# Patient Record
Sex: Female | Born: 1960 | Race: White | Hispanic: No | Marital: Married | Smoking: Current every day smoker
Health system: Southern US, Community
[De-identification: ages and names within clinical notes are randomized; demographics above are authoritative.]

## PROBLEM LIST (undated history)

## (undated) DIAGNOSIS — I071 Rheumatic tricuspid insufficiency: Secondary | ICD-10-CM

## (undated) DIAGNOSIS — F329 Major depressive disorder, single episode, unspecified: Secondary | ICD-10-CM

## (undated) DIAGNOSIS — N029 Recurrent and persistent hematuria with unspecified morphologic changes: Secondary | ICD-10-CM

## (undated) DIAGNOSIS — Z955 Presence of coronary angioplasty implant and graft: Secondary | ICD-10-CM

## (undated) DIAGNOSIS — E785 Hyperlipidemia, unspecified: Secondary | ICD-10-CM

## (undated) DIAGNOSIS — F32A Depression, unspecified: Secondary | ICD-10-CM

## (undated) DIAGNOSIS — I10 Essential (primary) hypertension: Secondary | ICD-10-CM

## (undated) DIAGNOSIS — M722 Plantar fascial fibromatosis: Secondary | ICD-10-CM

## (undated) DIAGNOSIS — F410 Panic disorder [episodic paroxysmal anxiety] without agoraphobia: Secondary | ICD-10-CM

## (undated) DIAGNOSIS — I2510 Atherosclerotic heart disease of native coronary artery without angina pectoris: Secondary | ICD-10-CM

## (undated) DIAGNOSIS — E278 Other specified disorders of adrenal gland: Secondary | ICD-10-CM

## (undated) DIAGNOSIS — I341 Nonrheumatic mitral (valve) prolapse: Secondary | ICD-10-CM

## (undated) DIAGNOSIS — E669 Obesity, unspecified: Secondary | ICD-10-CM

## (undated) DIAGNOSIS — G4730 Sleep apnea, unspecified: Secondary | ICD-10-CM

## (undated) HISTORY — DX: Nonrheumatic mitral (valve) prolapse: I34.1

## (undated) HISTORY — DX: Plantar fascial fibromatosis: M72.2

## (undated) HISTORY — DX: Recurrent and persistent hematuria with unspecified morphologic changes: N02.9

## (undated) HISTORY — DX: Major depressive disorder, single episode, unspecified: F32.9

## (undated) HISTORY — DX: Obesity, unspecified: E66.9

## (undated) HISTORY — DX: Hyperlipidemia, unspecified: E78.5

## (undated) HISTORY — DX: Depression, unspecified: F32.A

## (undated) HISTORY — DX: Atherosclerotic heart disease of native coronary artery without angina pectoris: I25.10

## (undated) HISTORY — DX: Essential (primary) hypertension: I10

## (undated) HISTORY — PX: HERNIA REPAIR: SHX51

## (undated) HISTORY — DX: Other specified disorders of adrenal gland: E27.8

## (undated) HISTORY — DX: Rheumatic tricuspid insufficiency: I07.1

## (undated) HISTORY — DX: Panic disorder (episodic paroxysmal anxiety): F41.0

---

## 2004-05-02 HISTORY — PX: CORONARY ANGIOPLASTY WITH STENT PLACEMENT: SHX49

## 2004-05-19 ENCOUNTER — Inpatient Hospital Stay (HOSPITAL_COMMUNITY): Admission: AD | Admit: 2004-05-19 | Discharge: 2004-05-23 | Payer: Self-pay | Admitting: Cardiology

## 2004-06-04 ENCOUNTER — Encounter: Payer: Self-pay | Admitting: Cardiovascular Disease

## 2004-06-30 ENCOUNTER — Encounter: Payer: Self-pay | Admitting: Cardiovascular Disease

## 2004-07-31 ENCOUNTER — Encounter: Payer: Self-pay | Admitting: Cardiovascular Disease

## 2004-08-30 ENCOUNTER — Encounter: Payer: Self-pay | Admitting: Cardiovascular Disease

## 2010-05-02 HISTORY — PX: ENDOMETRIAL ABLATION: SHX621

## 2010-05-02 HISTORY — PX: TUBAL LIGATION: SHX77

## 2011-01-04 ENCOUNTER — Ambulatory Visit: Payer: Self-pay | Admitting: Obstetrics and Gynecology

## 2011-01-13 ENCOUNTER — Ambulatory Visit: Payer: Self-pay | Admitting: Obstetrics and Gynecology

## 2012-10-30 ENCOUNTER — Ambulatory Visit: Payer: Self-pay | Admitting: Family Medicine

## 2013-05-02 DIAGNOSIS — N029 Recurrent and persistent hematuria with unspecified morphologic changes: Secondary | ICD-10-CM

## 2013-05-02 HISTORY — DX: Recurrent and persistent hematuria with unspecified morphologic changes: N02.9

## 2013-06-07 ENCOUNTER — Ambulatory Visit: Payer: Self-pay | Admitting: Urology

## 2013-07-01 ENCOUNTER — Ambulatory Visit: Payer: Self-pay | Admitting: Urology

## 2013-07-01 ENCOUNTER — Ambulatory Visit: Payer: Self-pay | Admitting: Internal Medicine

## 2013-07-16 ENCOUNTER — Encounter: Payer: Self-pay | Admitting: Internal Medicine

## 2013-07-16 ENCOUNTER — Ambulatory Visit (INDEPENDENT_AMBULATORY_CARE_PROVIDER_SITE_OTHER): Payer: Commercial Indemnity | Admitting: Internal Medicine

## 2013-07-16 VITALS — BP 124/88 | HR 100 | Temp 98.8°F | Resp 12 | Ht 65.0 in | Wt 203.0 lb

## 2013-07-16 DIAGNOSIS — E278 Other specified disorders of adrenal gland: Secondary | ICD-10-CM

## 2013-07-16 DIAGNOSIS — E279 Disorder of adrenal gland, unspecified: Secondary | ICD-10-CM

## 2013-07-16 MED ORDER — DEXAMETHASONE 1 MG PO TABS: ORAL_TABLET | ORAL | Status: DC

## 2013-07-16 NOTE — Progress Notes (Signed)
Patient ID: Marithza Malachi, female   DOB: 10/25/1960, 53 y.o.   MRN: [id number]   HPI  Maeghan Canny is a 53 y.o.-year-old female, referred by her PCP, Dr. Lovena Le [facility], for evaluation and management  a left adrenal incidentaloma.  I reviewed pt's previous CT scan from 06/2013 (investigating hematuria): incidental L adrenal mass, 1.1 x 0.9 cm, 2 HU. No previous mentioning of the mass.   Pt is asymptomatic, denies: - weight gain - palpitations - HA - HTN spells - a h/o DM2 - she has hot flushes - + depression, no anxiety  We do not have FH for her as she is adopted.  I reviewed her chart and she also has a history of HTN. She has a h/o depression - on Effexor. She tells me she cannot have MRIs as she had a metal LAD stent in 05/2004.   ROS: Constitutional: no weight gain/loss, no fatigue, no subjective hyperthermia/hypothermia, + hot flushes Eyes: no blurry vision, no xerophthalmia ENT: no sore throat, no nodules palpated in throat, no dysphagia/odynophagia, no hoarseness Cardiovascular: no CP/SOB/palpitations/leg swelling Respiratory: no cough/SOB Gastrointestinal: no N/V/D/C Musculoskeletal: no muscle/joint aches Skin: no rashes Neurological: no tremors/numbness/tingling/dizziness Psychiatric: no depression/anxiety  Past Medical History  Diagnosis Date  . Depression   . Heart disease     PT HAS STENTS   Past Surgical History  Procedure Laterality Date  . Hernia repair      at 36 yrs of age   History   Social History  . Marital Status: single    Spouse Name: N/A    Number of Children: 0   Occupational History  . Started a job 07/15/13 - [employer]   Social History Main Topics  . Smoking status: Smoker - 1 PPD  . Smokeless tobacco: No  . Alcohol Use: Beer, wine - weekly - 1-2 drinks  . Drug Use: No   Current Outpatient Rx  Name  Route  Sig  Dispense  Refill  . aspirin 81 MG tablet   Oral   Take 81 mg by mouth daily.         . AZOR 5-40 MG  per tablet   Oral   Take 1 tablet by mouth daily.          . clopidogrel (PLAVIX) 75 MG tablet   Oral   Take 75 mg by mouth once.          . CRESTOR 20 MG tablet   Oral   Take 20 mg by mouth daily.          Marland Kitchen venlafaxine XR (EFFEXOR-XR) 75 MG 24 hr capsule   Oral   Take 75 mg by mouth daily with breakfast.           Allergies  Allergen Reactions  . Dilaudid [Hydromorphone Hcl] Nausea And Vomiting   History reviewed. No available family history - pt adopted.  PE: BP 124/88  Pulse 100  Temp(Src) 98.8 F (37.1 C) (Oral)  Resp 12  Ht 5\' 5"  (1.651 m)  Wt 203 lb (92.08 kg)  BMI 33.78 kg/m2  SpO2 96% Wt Readings from Last 3 Encounters:  07/16/13 203 lb (92.08 kg)   Constitutional: overweight, in NAD, no moon facies Eyes: PERRLA, EOMI, no exophthalmos, no lid lag, no stare ENT: moist mucous membranes, no thyromegaly, no cervical lymphadenopathy Cardiovascular: RRR, No MRG Respiratory: CTA B Gastrointestinal: abdomen soft, NT, ND, BS+ Musculoskeletal: no deformities, strength intact in all 4 Skin: moist, warm, no rashes  Neurological: no tremor with outstretched hands, DTR normal in all 4  ASSESSMENT: 1. L adrenal mass - incidental finding on CT 06/2013.  PLAN:  1. Patient with a 1.1 L adrenal nodule discovered incidentally. - We discussed about the fact that there are 3 possible scenarios: - A nonfunctioning adrenal nodule - A functioning adrenal adenoma - which can hypersecrete catecholamines/metanephrines, cortisol, or aldosterone - Adrenal cancer/metastasis We do not have blood tests to check for cancer, but the best indicator is lack of change in size and appearance over time.  - To differentiate between a functioning and a nonfunctioning adrenal nodule, we'll need to rule out hypersecretion by checking the following tests  - dexamethasone suppression test to rule out Cushing syndrome (6% of adrenal incidentalomas). If the cortisol level returns >5, will  need 24h urine free cortisol - Plasma fractionated metanephrines and catecholamines to rule out pheochromocytoma (3% of adrenal incidentalomas) - Plasma renin activity and aldosterone level to rule out primary hyperaldosteronism (0.6% of adrenal incidentalomas) - I ordered the above tests today - she will have them done at [facility] as this is close to her home.  - I advised pt to take the dexamethasone tablet (1 mg, sent to pharmacy) at 11 PM the night before coming to the lab to have a cortisol level drawn. I also added a dexamethasone level. - We discussed about the need for an other CT scan, and I believe that we can wait for another year and then have her back for repeat hormonal testing and ordering a dedicated adrenal CT.  - we discussed about f/u:   hormonal testing yearly for 5 years   CT scans yearly x1-2 - I explained all the above to the patient, and she agrees with the plan. - I will see her back in a year  Component     Latest Ref Rng 07/24/2013 07/26/2013  Epinephrine      22   Norepinephrine      476   Dopamine      <30   Catecholamines, Total      498   Metanephrine, Free     <=57 pg/mL <25   Normetanephrine, Free     <=148 pg/mL 112   Total Metanephrines-Plasma     <=205 pg/mL 112   PRA LC/MS/MS     0.25 - 5.82 ng/mL/h 5.79   ALDO / PRA Ratio     0.9 - 28.9 Ratio 1.2   ALDOSTERONE      7   Cortisol, Plasma      14.9 1.1 (dexamethasone suppression test)  Potassium     3.5 - 5.1 mEq/L 4.0    All labs normal.

## 2013-07-16 NOTE — Patient Instructions (Addendum)
Please come in am for labs. Please come first for the plasma metanephrines and catecholamines, plasma renin activity, aldosterone and potassium. Take Dexamethasone at 11 pm and come the next am for a cortisol level and a Dexamethasone level. Please return in 1 year.

## 2013-07-24 ENCOUNTER — Other Ambulatory Visit (INDEPENDENT_AMBULATORY_CARE_PROVIDER_SITE_OTHER): Payer: Commercial Indemnity

## 2013-07-24 DIAGNOSIS — E279 Disorder of adrenal gland, unspecified: Secondary | ICD-10-CM

## 2013-07-24 DIAGNOSIS — E278 Other specified disorders of adrenal gland: Secondary | ICD-10-CM

## 2013-07-24 LAB — CORTISOL: Cortisol, Plasma: 14.9 ug/dL

## 2013-07-24 LAB — POTASSIUM: Potassium: 4 mEq/L (ref 3.5–5.1)

## 2013-07-25 ENCOUNTER — Other Ambulatory Visit: Payer: Self-pay | Admitting: *Deleted

## 2013-07-25 DIAGNOSIS — E278 Other specified disorders of adrenal gland: Secondary | ICD-10-CM

## 2013-07-26 ENCOUNTER — Other Ambulatory Visit (INDEPENDENT_AMBULATORY_CARE_PROVIDER_SITE_OTHER): Payer: Commercial Indemnity

## 2013-07-26 DIAGNOSIS — E279 Disorder of adrenal gland, unspecified: Secondary | ICD-10-CM

## 2013-07-26 DIAGNOSIS — E278 Other specified disorders of adrenal gland: Secondary | ICD-10-CM

## 2013-07-26 LAB — CORTISOL: Cortisol, Plasma: 1.1 ug/dL

## 2013-07-29 LAB — CATECHOLAMINES, FRACTIONATED, PLASMA
Catecholamines, Total: 498 pg/mL
Epinephrine: 22 pg/mL
Norepinephrine: 476 pg/mL

## 2013-07-29 LAB — METANEPHRINES, PLASMA
Metanephrine, Free: <25 pg/mL (ref <=57)
Normetanephrine, Free: 112 pg/mL (ref <=148)
Total Metanephrines-Plasma: 112 pg/mL (ref <=205)

## 2013-08-02 LAB — ALDOSTERONE + RENIN ACTIVITY W/ RATIO
ALDO / PRA RATIO: 1.2 ratio (ref 0.9–28.9)
Aldosterone: 7 ng/dL
PRA LC/MS/MS: 5.79 ng/mL/h (ref 0.25–5.82)

## 2013-08-04 LAB — DEXAMETHASONE, BLOOD: Dexamethasone, Serum: <20 ng/dL

## 2013-08-05 ENCOUNTER — Encounter: Payer: Self-pay | Admitting: Internal Medicine

## 2014-11-06 DIAGNOSIS — Z720 Tobacco use: Secondary | ICD-10-CM | POA: Insufficient documentation

## 2014-11-06 DIAGNOSIS — E278 Other specified disorders of adrenal gland: Secondary | ICD-10-CM | POA: Insufficient documentation

## 2014-11-06 DIAGNOSIS — F410 Panic disorder [episodic paroxysmal anxiety] without agoraphobia: Secondary | ICD-10-CM | POA: Insufficient documentation

## 2014-11-06 DIAGNOSIS — I071 Rheumatic tricuspid insufficiency: Secondary | ICD-10-CM | POA: Insufficient documentation

## 2014-11-06 DIAGNOSIS — E669 Obesity, unspecified: Secondary | ICD-10-CM | POA: Insufficient documentation

## 2014-11-06 DIAGNOSIS — I2510 Atherosclerotic heart disease of native coronary artery without angina pectoris: Secondary | ICD-10-CM | POA: Insufficient documentation

## 2014-11-06 DIAGNOSIS — I341 Nonrheumatic mitral (valve) prolapse: Secondary | ICD-10-CM | POA: Insufficient documentation

## 2014-11-06 DIAGNOSIS — N943 Premenstrual tension syndrome: Secondary | ICD-10-CM | POA: Insufficient documentation

## 2014-11-06 DIAGNOSIS — I10 Essential (primary) hypertension: Secondary | ICD-10-CM | POA: Insufficient documentation

## 2014-11-06 DIAGNOSIS — E785 Hyperlipidemia, unspecified: Secondary | ICD-10-CM | POA: Insufficient documentation

## 2014-11-07 ENCOUNTER — Ambulatory Visit (INDEPENDENT_AMBULATORY_CARE_PROVIDER_SITE_OTHER): Payer: Commercial Indemnity | Admitting: Family Medicine

## 2014-11-07 ENCOUNTER — Encounter: Payer: Self-pay | Admitting: Family Medicine

## 2014-11-07 VITALS — BP 130/87 | HR 61 | Temp 98.5°F | Wt 199.0 lb

## 2014-11-07 DIAGNOSIS — E669 Obesity, unspecified: Secondary | ICD-10-CM | POA: Diagnosis not present

## 2014-11-07 DIAGNOSIS — M545 Low back pain, unspecified: Secondary | ICD-10-CM

## 2014-11-07 DIAGNOSIS — I10 Essential (primary) hypertension: Secondary | ICD-10-CM

## 2014-11-07 DIAGNOSIS — Z720 Tobacco use: Secondary | ICD-10-CM | POA: Diagnosis not present

## 2014-11-07 MED ORDER — LOSARTAN POTASSIUM-HCTZ 100-25 MG PO TABS: 1.0000 | ORAL_TABLET | Freq: Every day | ORAL | Status: DC

## 2014-11-07 NOTE — Patient Instructions (Addendum)
Your goal blood pressure is less than  140/90 Try to follow the DASH guidelines (DASH stands for Dietary Approaches to Stop Hypertension) Try to limit the sodium in your diet.  Ideally, consume less than 1.5 grams (less than 1,500mg ) per day. Do not add salt when cooking or at the table.  Check the sodium amount on labels when shopping, and choose items lower in sodium when given a choice. Avoid or limit foods that already contain a lot of sodium. Eat a diet rich in fruits and vegetables and whole grains. Do try to quit smoking; I'm here to help if and when you are ready Try to use PLAIN allergy medicine without the decongestant Avoid: phenylephrine, phenylpropanolamine, and pseudoephredine Stop current blood pressure medicine Azor Start the new combination pill Continue the metoprolol Monitor your blood pressure and call if feeling light-headed or dizzy

## 2014-11-07 NOTE — Progress Notes (Signed)
BP 130/87 mmHg  Pulse 61  Temp(Src) 98.5 F (36.9 C)  Wt 199 lb (90.266 kg)  SpO2 97%   Subjective:    Patient ID: Maria Franco, female    DOB: 08-13-1960, 54 y.o.   MRN: [id number]  HPI: Maria Franco is a 54 y.o. female  Chief Complaint  Patient presents with  . Hypertension   Hypertension She doesn't think that the medicine is working well Patient has had hypertension since 20-25 years ago Checking blood pressure away from here?  yes How often? Just recently, started checking it this week; wasn't feeling right so started to check; no chest pain, leg swelling, no palpations Range (low to high) over last two weeks:  170/102 the other day, but her back had been hurting for several days and she did not take medicines for several days Hypertension-associated complications:  none If taking medicines, are you taking them regularly?  NO Siblings / family history: Does high blood pressure run in your family?   no, not known, adopted Salt:  Trying to limit sodium / salt when buying foods at the grocery store?  yes  Do you try to limit added salt when cooking and at the table?  yes, little bit Sweets/licorice:  Do you eat a lot of sweets or eat black licorice?  no Saturated fats: Do you eat a lot of foods like bacon, sausage, pepperoni, cheeseburgers, hot dogs, bologna, and cheese?  YES; bacon and sausage a few times a month Sedentary lifestyle:  Exercise/activity level:  sedentary (essential NO activity); going to join gym next week Just frustrated trying to find a job; not hopeless or helpless Steroids/Non-steroidals:  Have you had a recent cortisone shot in the last few months?  no  Do you take prednisone or prescription NSAIDs, no, or take OTCS NSAIDs such as ibuprofen, Motrin, Advil, Aleve, or naproxen? no Smoking: Do you smoke?  YES ("I take the fifth") Snoring / sleep apnea: Do you snore or have sleep apnea?  YES  If diagnosed with sleep apnea, do you use your machine?  NO Stroh's  (alcohol): Do you drink alcohol  yes  --- if yes, do you drink more than 14 drinks/week if female or more than 7 drinks/week if female?  YES , cautioned Sudafed (decongestants): Do you use any OTC decongestant products like Allegra-D, Claritin-D, Zyrtec-D, Tylenol Cold and Sinus, etc.?  no  Low back pain; she has been sedentary; Milta Deiters (chiropractor) out of the office; aching pain; in th emidline, pointing to L4 Relevant past medical, surgical, family and social history reviewed and updated as indicated. Interim medical history since our last visit reviewed. Allergies and medications reviewed and updated.  Review of Systems  Per HPI unless specifically indicated above     Objective:    BP 130/87 mmHg  Pulse 61  Temp(Src) 98.5 F (36.9 C)  Wt 199 lb (90.266 kg)  SpO2 97%  Wt Readings from Last 3 Encounters:  11/07/14 199 lb (90.266 kg)  08/22/14 199 lb (90.266 kg)  07/16/13 203 lb (92.08 kg)   recheck BP 130/87  Physical Exam  Constitutional: She appears well-developed and well-nourished. No distress.  Eyes: EOM are normal. No scleral icterus.  Neck: No thyromegaly present.  Cardiovascular: Normal rate.   Pulmonary/Chest: Effort normal.  Abdominal: She exhibits no distension.  Musculoskeletal:       Lumbar back: She exhibits no tenderness, no bony tenderness and no deformity.  Skin: No pallor.  Psychiatric: She has a  normal mood and affect. Her behavior is normal. Judgment and thought content normal.      Assessment & Plan:   Problem List Items Addressed This Visit      Cardiovascular and Mediastinum   Hypertension - Primary (Chronic)    Not to goal; patient wants to change BP medicine completely; she is max dose Azor; will stop that; start ARB plus thiazide; monitor BP; return for recheck, BMP in 2 weeks; call sooner if needed; continue other meds; weight loss, smoking cessation, avoidance of decongestants all reviewed; DASH guidelines encouraged      Relevant Medications    losartan-hydrochlorothiazide (HYZAAR) 100-25 MG per tablet     Other   Obesity    Weight loss would help lower BP      Tobacco use    She is not motivated to quit right now       Other Visit Diagnoses    Midline low back pain without sciatica        she'll see her chiropractor       Follow up plan: Return in about 2 weeks (around 11/21/2014).

## 2014-11-08 NOTE — Assessment & Plan Note (Addendum)
Not to goal; patient wants to change BP medicine completely; she is max dose Azor; will stop that; start ARB plus thiazide; monitor BP; return for recheck, BMP in 2 weeks; call sooner if needed; continue other meds; weight loss, smoking cessation, avoidance of decongestants all reviewed; DASH guidelines encouraged

## 2014-11-08 NOTE — Assessment & Plan Note (Signed)
Weight loss would help lower BP

## 2014-11-08 NOTE — Assessment & Plan Note (Signed)
She is not motivated to quit right now

## 2014-11-21 ENCOUNTER — Encounter: Payer: Self-pay | Admitting: Family Medicine

## 2014-11-21 ENCOUNTER — Ambulatory Visit (INDEPENDENT_AMBULATORY_CARE_PROVIDER_SITE_OTHER): Payer: Commercial Indemnity | Admitting: Family Medicine

## 2014-11-21 VITALS — BP 138/86 | HR 69 | Temp 98.2°F | Wt 197.8 lb

## 2014-11-21 DIAGNOSIS — I10 Essential (primary) hypertension: Secondary | ICD-10-CM | POA: Diagnosis not present

## 2014-11-21 DIAGNOSIS — Z955 Presence of coronary angioplasty implant and graft: Secondary | ICD-10-CM | POA: Insufficient documentation

## 2014-11-21 MED ORDER — ROSUVASTATIN CALCIUM 20 MG PO TABS: 20.0000 mg | ORAL_TABLET | Freq: Every day | ORAL | Status: DC

## 2014-11-21 NOTE — Progress Notes (Signed)
BP 138/86 mmHg  Pulse 69  Temp(Src) 98.2 F (36.8 C)  Wt 197 lb 12.8 oz (89.721 kg)  SpO2 98%   Subjective:    Patient ID: Maria Franco, female    DOB: 1961-02-05, 54 y.o.   MRN: [id number]  HPI: Maria Franco is a 54 y.o. female  Chief Complaint  Patient presents with  . Hypertension  . Hyperlipidemia    Patient was recently changed to 40mg  crestor, she was having joint pains so she stopped taking it all together. She would like to go back to 20mg , can she cut the 40mg  in half and use those up first? Also her mother would like her to ask if she needs to take CoQ10 since she is on crestor.   Status post coronary artery stent, [facility] [phone], [email or web address] cypher 3.00 x 23 mm on May 20, 2004 at the mid LAD [place] She had aching in the knees after going up to 40 mg Crestor; stopped it a few days and the aching is getting better but not relieved it;  Keeps NTG handy but has never had to use Hypertension; had coffee this morning; had something salty yesterday; checks at home; ranges 130s/80s, no low BPs; only once over 150 but right after the new med got started Feeling much better She has lost two pounds in two weeks  Relevant past medical, surgical, family and social history reviewed and updated as indicated. Interim medical history since our last visit reviewed. Allergies and medications reviewed and updated.  Review of Systems Per HPI unless specifically indicated above     Objective:    BP 138/86 mmHg  Pulse 69  Temp(Src) 98.2 F (36.8 C)  Wt 197 lb 12.8 oz (89.721 kg)  SpO2 98%  Wt Readings from Last 3 Encounters:  11/21/14 197 lb 12.8 oz (89.721 kg)  11/07/14 199 lb (90.266 kg)  08/22/14 199 lb (90.266 kg)    Physical Exam  Constitutional: She appears well-developed and well-nourished. No distress.  HENT:  Head: Normocephalic and atraumatic.  Eyes: EOM are normal.  Cardiovascular: Normal rate, regular rhythm and normal heart  sounds.   Pulmonary/Chest: Effort normal and breath sounds normal.  Abdominal: Soft. Bowel sounds are normal. She exhibits no distension.  Musculoskeletal: Normal range of motion. She exhibits no edema.       Right knee: She exhibits normal range of motion, no swelling and no effusion.       Left knee: She exhibits normal range of motion, no swelling and no effusion.  Neurological: She is alert.  Skin: Skin is warm and dry. She is not diaphoretic. No pallor.  Psychiatric: She has a normal mood and affect. Her behavior is normal. Judgment and thought content normal.    Results for orders placed or performed in visit on 07/26/13  Cortisol  Result Value Ref Range   Cortisol, Plasma 1.1 ug/dL      Assessment & Plan:   Problem List Items Addressed This Visit      Cardiovascular and Mediastinum   Hypertension - Primary (Chronic)    Improved; continue meds; BMP collected today      Relevant Medications   rosuvastatin (CRESTOR) 20 MG tablet   Other Relevant Orders   Basic Metabolic Panel (BMET)     Other   Status post coronary artery stent placement    cypher stent info in note; continue plavix, aspirin, beta-blocker, restart statin after symptoms resolve; she has cardiologist  Follow up plan: Return in about 3 months (around 02/21/2015) for blood pressure, lipids.

## 2014-11-21 NOTE — Assessment & Plan Note (Signed)
Improved; continue meds; BMP collected today

## 2014-11-21 NOTE — Assessment & Plan Note (Signed)
cypher stent info in note; continue plavix, aspirin, beta-blocker, restart statin after symptoms resolve; she has cardiologist

## 2014-11-21 NOTE — Patient Instructions (Addendum)
Stay off the Crestor for another few weeks and let your side effects resolve completely Then okay to start back on 20 mg Crestor  Keep the NTG tablets handy so you will hopefully never need them Keep up the great job with weight loss Start CoQ-10 Limit saturated fats Avoid black cohosh We'll let you know about the lab results Do avoid salt substitutes Return in 3 months

## 2014-11-22 ENCOUNTER — Encounter: Payer: Self-pay | Admitting: Family Medicine

## 2014-11-22 LAB — BASIC METABOLIC PANEL
BUN/Creatinine Ratio: 11 (ref 9–23)
BUN: 9 mg/dL (ref 6–24)
CHLORIDE: 97 mmol/L (ref 97–108)
CO2: 26 mmol/L (ref 18–29)
Calcium: 9.6 mg/dL (ref 8.7–10.2)
Creatinine, Ser: 0.82 mg/dL (ref 0.57–1.00)
GFR, EST AFRICAN AMERICAN: 95 mL/min/{1.73_m2} (ref >59)
GFR, EST NON AFRICAN AMERICAN: 83 mL/min/{1.73_m2} (ref >59)
GLUCOSE: 109 mg/dL — AB (ref 65–99)
Potassium: 3.9 mmol/L (ref 3.5–5.2)
Sodium: 140 mmol/L (ref 134–144)

## 2014-12-16 ENCOUNTER — Other Ambulatory Visit: Payer: Self-pay | Admitting: Family Medicine

## 2014-12-16 MED ORDER — LOSARTAN POTASSIUM-HCTZ 100-25 MG PO TABS: 1.0000 | ORAL_TABLET | Freq: Every day | ORAL | Status: DC

## 2014-12-16 NOTE — Telephone Encounter (Signed)
E-fax came through for refil on: Rx: losartan-hydrochlorothiazide (HYZAAR) 100-25 MG per tablet Copy in basket.

## 2014-12-16 NOTE — Telephone Encounter (Signed)
Last BMP from July 2016 reviewed; Rx approved

## 2014-12-16 NOTE — Telephone Encounter (Signed)
She would like to get a 90 day supply.

## 2014-12-17 ENCOUNTER — Encounter: Payer: Self-pay | Admitting: Emergency Medicine

## 2014-12-17 ENCOUNTER — Emergency Department: Payer: Commercial Indemnity

## 2014-12-17 ENCOUNTER — Telehealth: Payer: Self-pay | Admitting: Family Medicine

## 2014-12-17 ENCOUNTER — Emergency Department
Admission: EM | Admit: 2014-12-17 | Discharge: 2014-12-17 | Disposition: A | Discharge status: Home / Self Care | Payer: Commercial Indemnity | Attending: Emergency Medicine | Admitting: Emergency Medicine

## 2014-12-17 DIAGNOSIS — Z7982 Long term (current) use of aspirin: Secondary | ICD-10-CM | POA: Diagnosis not present

## 2014-12-17 DIAGNOSIS — I10 Essential (primary) hypertension: Secondary | ICD-10-CM

## 2014-12-17 DIAGNOSIS — Z79899 Other long term (current) drug therapy: Secondary | ICD-10-CM | POA: Insufficient documentation

## 2014-12-17 DIAGNOSIS — R51 Headache: Secondary | ICD-10-CM | POA: Diagnosis present

## 2014-12-17 DIAGNOSIS — Z720 Tobacco use: Secondary | ICD-10-CM | POA: Diagnosis not present

## 2014-12-17 DIAGNOSIS — Z7902 Long term (current) use of antithrombotics/antiplatelets: Secondary | ICD-10-CM | POA: Diagnosis not present

## 2014-12-17 DIAGNOSIS — R519 Headache, unspecified: Secondary | ICD-10-CM

## 2014-12-17 HISTORY — DX: Presence of coronary angioplasty implant and graft: Z95.5

## 2014-12-17 MED ORDER — BUTALBITAL-APAP-CAFFEINE 50-325-40 MG PO TABS
1.0000 | ORAL_TABLET | Freq: Once | ORAL | Status: AC
  Administered 2014-12-17: 1 via ORAL

## 2014-12-17 MED ORDER — BUTALBITAL-APAP-CAFFEINE 50-325-40 MG PO TABS: 1.0000 | ORAL_TABLET | Freq: Four times a day (QID) | ORAL | Status: DC | PRN

## 2014-12-17 MED ORDER — BUTALBITAL-APAP-CAFFEINE 50-325-40 MG PO TABS
ORAL_TABLET | ORAL | Status: AC
  Administered 2014-12-17: 1 via ORAL
  Filled 2014-12-17: qty 1

## 2014-12-17 NOTE — Telephone Encounter (Signed)
She missed her Hyzaar for a few days and wonders if it could have caused her to have a migraine. She picked up her rx on Monday and started back taking it but still has migraine. It kinda comes comes and goes. She wants to know if this is a side effect and if so, what can she take to make the migraine go away. She has tried tylenol with no luck.

## 2014-12-17 NOTE — Telephone Encounter (Signed)
Attempted to call patient, it says "call can not be completed as dialed". I attempted it with and without the area code.

## 2014-12-17 NOTE — ED Notes (Addendum)
Pt states she didn't take her BP meds over the weekend and started having a headache. Called PCP who told her to come to get CT of the head. Started taking medications yesterday for BP. Headache pain is 1/10.

## 2014-12-17 NOTE — ED Provider Notes (Signed)
[facility] Emergency Department Provider Note  ____________________________________________  Time seen: Approximately 910 PM  I have reviewed the triage vital signs and the nursing notes.   HISTORY  Chief Complaint Hypertension    HPI Maria Franco is a 54 y.o. female with a history of hypertension and CAD with stents who presents today with 3 days of right-sided headache.  She says that she has not been taking her blood pressure medicine from Friday through yesterday. She said that she skipped her meds because she was busy at home dealing with a broken well. She says she retook the meds starting yesterday. She called her doctor today who told her to come to the emergency department because of the threat of high blood pressure with a headache. The patient said that when she took her pressure, was 130s over 90s. She says the headache started gradually 2 days ago and has been waxing and waning. She has photophobia associated. No vomiting. No blurred vision. No neck stiffness.   Past Medical History  Diagnosis Date  . Depression   . Heart disease     PT HAS STENTS  . Hypertension   . Hyperlipidemia   . Panic attacks   . 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 Adrenal mass, left     followed by Endocrine, hormone testing q 5 years and CT q 1-2 years  . Obesity   . CAD (coronary artery disease)   . Premenstrual symptom   . Tobacco use   . Tricuspid valve regurgitation   . MVP (mitral valve prolapse)   . Presence of stent in LAD coronary artery     Patient Active Problem List   Diagnosis Date Noted  . Status post coronary artery stent placement 11/21/2014  . Hypertension   . Hyperlipidemia   . Panic attacks   . Adrenal mass, left   . Obesity   . CAD (coronary artery disease)   . Premenstrual symptom   . Tobacco use   . Tricuspid valve regurgitation   . MVP (mitral valve prolapse)   . Left adrenal mass 07/16/2013  .  Recurrent and persistent hematuria 05/02/2013    Past Surgical History  Procedure Laterality Date  . Hernia repair      at 65 yrs of age  . Coronary angioplasty with stent placement  2006  . Tubal ligation  2012  . Endometrial ablation  2012    Current Outpatient Rx  Name  Route  Sig  Dispense  Refill  . aspirin 81 MG tablet   Oral   Take 81 mg by mouth daily.         . clopidogrel (PLAVIX) 75 MG tablet   Oral   Take 75 mg by mouth daily.          Marland Kitchen losartan-hydrochlorothiazide (HYZAAR) 100-25 MG per tablet   Oral   Take 1 tablet by mouth daily.   90 tablet   1   . metoprolol succinate (TOPROL-XL) 25 MG 24 hr tablet   Oral   Take 25 mg by mouth daily.         . rosuvastatin (CRESTOR) 20 MG tablet   Oral   Take 1 tablet (20 mg total) by mouth at bedtime.   30 tablet   2   . venlafaxine XR (EFFEXOR-XR) 75 MG 24 hr capsule   Oral   Take 75 mg by mouth daily with breakfast.  Allergies Dilaudid  Family History  Problem Relation Age of Onset  . Adopted: Yes  . Family history unknown: Yes    Social History Social History  Substance Use Topics  . Smoking status: Current Every Day Smoker  . Smokeless tobacco: Never Used  . Alcohol Use: Yes    Review of Systems Constitutional: No fever/chills Eyes: No visual changes. ENT: No sore throat. Cardiovascular: Denies chest pain. Respiratory: Denies shortness of breath. Gastrointestinal: No abdominal pain.  No nausea, no vomiting.  No diarrhea.  No constipation. Genitourinary: Negative for dysuria. Musculoskeletal: Negative for back pain. Skin: Negative for rash. Neurological: Negative for focal weakness or numbness.  10-point ROS otherwise negative.  ____________________________________________   PHYSICAL EXAM:  VITAL SIGNS: ED Triage Vitals  Enc Vitals Group     BP 12/17/14 1840 168/95 mmHg     Pulse Rate 12/17/14 1840 83     Resp 12/17/14 1840 20     Temp 12/17/14 1840 98.6 F  (37 C)     Temp Source 12/17/14 1840 Oral     SpO2 12/17/14 1840 100 %     Weight 12/17/14 1840 250 lb (113.399 kg)     Height 12/17/14 1840 5\' 7"  (1.702 m)     Head Cir --      Peak Flow --      Pain Score 12/17/14 1841 4     Pain Loc --      Pain Edu? --      Excl. in [place]? --     Constitutional: Alert and oriented. Well appearing and in no acute distress. Eyes: Conjunctivae are normal. PERRL. EOMI. Head: Atraumatic. No tenderness over the temples or nodularity over the distribution of the temporal arteries. no jaw claudication. Nose: No congestion/rhinnorhea. Mouth/Throat: Mucous membranes are moist.  Oropharynx non-erythematous. Neck: No stridor.   Cardiovascular: Normal rate, regular rhythm. Grossly normal heart sounds.  Good peripheral circulation. Respiratory: Normal respiratory effort.  No retractions. Lungs CTAB. Gastrointestinal: Soft and nontender. No distention. No abdominal bruits. No CVA tenderness. Musculoskeletal: No lower extremity tenderness nor edema.  No joint effusions. Neurologic:  Normal speech and language. No gross focal neurologic deficits are appreciated. No gait instability. Skin:  Skin is warm, dry and intact. No rash noted. Psychiatric: Mood and affect are normal. Speech and behavior are normal.  ____________________________________________   LABS (all labs ordered are listed, but only abnormal results are displayed)  Labs Reviewed - No data to display ____________________________________________  EKG   ____________________________________________  RADIOLOGY  Unremarkable noncontrast CT of the head. ____________________________________________   PROCEDURES   ____________________________________________   INITIAL IMPRESSION / ASSESSMENT AND PLAN / ED COURSE  Pertinent labs & imaging results that were available during my care of the patient were reviewed by me and considered in my medical decision making (see chart for  details).  Patient without concerning features for intracranial hemorrhage. Possible migraine headache. We'll discharge with Fioricet. Patient to continue taking her meds as prescribed. Will follow up with her primary care doctor in 1 week for blood pressure recheck. Says that the pain from her headache at this time is a 0-1. ____________________________________________   FINAL CLINICAL IMPRESSION(S) / ED DIAGNOSES  Acute headache. Acute hypertension. Initial visit.    Orbie Pyo, MD 12/17/14 2123

## 2014-12-17 NOTE — Telephone Encounter (Signed)
Pt called would like to discuss bp medication. Pt has a migraine but did not take her BP medication for 2-3 days and wants to know if the migraine could have been caused by not taking her BP medications. Thanks.

## 2014-12-17 NOTE — Telephone Encounter (Signed)
I spoke with patient; her headache is "awful"; she talked to her friend who has migraines; she has never taken this medicine She has no hx of migraines Pain is right above right side of the head; 2 inches above ear; going on since Monday; no trouble speaking or swallowing Little bit of nausea, brief The fact that she is on aspirin plus Plavix and has persistent unilateral headache in one location with no prior hx of migraine after skipping a few days of BP med, I have to consider a bleed as a possibility; might be very small with nothing to do but consider whether to stop aspirin and/or Plavix; a head CT is warranted in my opinion

## 2014-12-17 NOTE — ED Notes (Signed)
Reports forgetting to take bp meds on Friday and started back Monday, but still having headache since

## 2014-12-18 ENCOUNTER — Telehealth: Payer: Self-pay

## 2014-12-18 NOTE — Telephone Encounter (Signed)
She did go to the ER last night, her head CT came back fine and they gave her a rx for firocet.

## 2014-12-19 ENCOUNTER — Telehealth: Payer: Self-pay | Admitting: Family Medicine

## 2014-12-19 NOTE — Telephone Encounter (Signed)
I talked to patient Tylenol is okay Patient instructed to come for 11:00 am on Monday for headache and BP If needed, okay to take 50 mg of toprol daily over the weekend if needed BP was 127/80 something just now She has never had a migraine; could be headache; a little tender to touch, light sensitive; sinusitis, dehydration, lots of things in DDX No memory loss, no vision changes The meds the ER gave her helped

## 2014-12-19 NOTE — Telephone Encounter (Signed)
Routing to provider  

## 2014-12-19 NOTE — Telephone Encounter (Signed)
PT WOULD LIKE A CALL BACK REGARDING A FOLLOW UP TO HER HOSPITAL VISIT AND HER HEADACHES. SHE STATED THAT THE HEADACHES ARE STILL THERE AND HER BP HASNT CAME DOWN A LOT.

## 2014-12-22 ENCOUNTER — Encounter: Payer: Self-pay | Admitting: Family Medicine

## 2014-12-22 ENCOUNTER — Ambulatory Visit (INDEPENDENT_AMBULATORY_CARE_PROVIDER_SITE_OTHER): Payer: Commercial Indemnity | Admitting: Family Medicine

## 2014-12-22 VITALS — BP 138/89 | HR 72 | Temp 97.9°F | Ht 64.3 in | Wt 197.6 lb

## 2014-12-22 DIAGNOSIS — I2510 Atherosclerotic heart disease of native coronary artery without angina pectoris: Secondary | ICD-10-CM

## 2014-12-22 DIAGNOSIS — R519 Headache, unspecified: Secondary | ICD-10-CM | POA: Insufficient documentation

## 2014-12-22 DIAGNOSIS — I2583 Coronary atherosclerosis due to lipid rich plaque: Secondary | ICD-10-CM

## 2014-12-22 DIAGNOSIS — R51 Headache: Secondary | ICD-10-CM

## 2014-12-22 DIAGNOSIS — G44209 Tension-type headache, unspecified, not intractable: Secondary | ICD-10-CM

## 2014-12-22 DIAGNOSIS — I10 Essential (primary) hypertension: Secondary | ICD-10-CM

## 2014-12-22 MED ORDER — CYCLOBENZAPRINE HCL 10 MG PO TABS: 10.0000 mg | ORAL_TABLET | Freq: Three times a day (TID) | ORAL | Status: DC | PRN

## 2014-12-22 MED ORDER — ROSUVASTATIN CALCIUM 40 MG PO TABS: 20.0000 mg | ORAL_TABLET | Freq: Every day | ORAL | Status: DC

## 2014-12-22 NOTE — Assessment & Plan Note (Signed)
Negative head CT (done in the ER); no palpable cords or tenderness along temporal artery; no vision changes; will have her use oral appliance and muscle relaxant; advised do NOT take muscle relaxant with alcohol or sedative or pain medicine

## 2014-12-22 NOTE — Assessment & Plan Note (Signed)
She is on Crestor per cardiologist; she'll start back on 20 mg instead of 40 mg daily to see if better tolerated

## 2014-12-22 NOTE — Progress Notes (Signed)
BP 138/89 mmHg  Pulse 72  Temp(Src) 97.9 F (36.6 C)  Ht 5' 4.3" (1.633 m)  Wt 197 lb 9.6 oz (89.631 kg)  BMI 33.61 kg/m2  SpO2 98%   Subjective:    Patient ID: Maria Franco, female    DOB: 02/26/1961, 54 y.o.   MRN: [id number]  HPI: Maria Franco is a 54 y.o. female  Chief Complaint  Patient presents with  . Hypertension   Patient is here for follow-up; she had very high blood pressure and headache last week and was sent to the ER; she had a head CT which was negative; see previous phone notes BP 121/79 on Friday 115/71 on Saturday That is not on the doubled up beta-blocker as we discussed last week The headache is still there but thinking not related to her BP; she is clenching her teeth at night and during the day; she has a night guard and started using it again on Saturday; hurt so bad along the right side of the temple She had stopped the Crestor (prescribed by Dr. Humphrey Rolls) for aches, but is going to start back on 20 mg now (half the previous dose)  Relevant past medical, surgical, family and social history reviewed and updated as indicated. Interim medical history since our last visit reviewed. Allergies and medications reviewed and updated.  Review of Systems Per HPI unless specifically indicated above     Objective:    BP 138/89 mmHg  Pulse 72  Temp(Src) 97.9 F (36.6 C)  Ht 5' 4.3" (1.633 m)  Wt 197 lb 9.6 oz (89.631 kg)  BMI 33.61 kg/m2  SpO2 98%  Wt Readings from Last 3 Encounters:  12/22/14 197 lb 9.6 oz (89.631 kg)  12/17/14 250 lb (113.399 kg)  11/21/14 197 lb 12.8 oz (89.721 kg)   the 250 pound weight is completely erroneous  Physical Exam  Constitutional: She appears well-developed and well-nourished. No distress.  HENT:  Head: Normocephalic and atraumatic.  No palpable cords or tenderness along the temporal region  Eyes: EOM are normal. Pupils are equal, round, and reactive to light. Right eye exhibits no discharge. Left eye exhibits no  discharge. No scleral icterus.  Color change of iris 12 o'clock position (brownish coloration)  Neck: No thyromegaly present.  Cardiovascular: Normal rate, regular rhythm and normal heart sounds.   No murmur heard. Pulmonary/Chest: Effort normal and breath sounds normal. No respiratory distress. She has no wheezes.  Abdominal: Soft. Bowel sounds are normal. She exhibits no distension.  Musculoskeletal: Normal range of motion. She exhibits no edema.  Lymphadenopathy:    She has no cervical adenopathy.  Neurological: She is alert. She displays no tremor. She exhibits normal muscle tone. Gait normal.  Skin: Skin is warm and dry. She is not diaphoretic. No pallor.  Psychiatric: She has a normal mood and affect. Her behavior is normal. Judgment and thought content normal.   Results for orders placed or performed in visit on 35/70/17  Basic Metabolic Panel (BMET)  Result Value Ref Range   Glucose 109 (H) 65 - 99 mg/dL   BUN 9 6 - 24 mg/dL   Creatinine, Ser 0.82 0.57 - 1.00 mg/dL   GFR calc non Af Amer 83 >59 mL/min/1.73   GFR calc Af Amer 95 >59 mL/min/1.73   BUN/Creatinine Ratio 11 9 - 23   Sodium 140 134 - 144 mmol/L   Potassium 3.9 3.5 - 5.2 mmol/L   Chloride 97 97 - 108 mmol/L   CO2 26  18 - 29 mmol/L   Calcium 9.6 8.7 - 10.2 mg/dL      Assessment & Plan:   Problem List Items Addressed This Visit      Cardiovascular and Mediastinum   Hypertension (Chronic)    Well-controlled over the weekend; monitor BP at home, continue current regimen; DASH guidelines      Relevant Medications   rosuvastatin (CRESTOR) 40 MG tablet   CAD (coronary artery disease)    She is on Crestor per cardiologist; she'll start back on 20 mg instead of 40 mg daily to see if better tolerated      Relevant Medications   rosuvastatin (CRESTOR) 40 MG tablet     Other   Headache - Primary    Negative head CT (done in the ER); no palpable cords or tenderness along temporal artery; no vision changes; will  have her use oral appliance and muscle relaxant; advised do NOT take muscle relaxant with alcohol or sedative or pain medicine      Relevant Medications   cyclobenzaprine (FLEXERIL) 10 MG tablet      Follow up plan: Return in about 3 months (around 03/24/2015).  Meds ordered this encounter  Medications  . cyclobenzaprine (FLEXERIL) 10 MG tablet    Sig: Take 1 tablet (10 mg total) by mouth every 8 (eight) hours as needed for muscle spasms. No alcohol    Dispense:  30 tablet    Refill:  1  . rosuvastatin (CRESTOR) 40 MG tablet    Sig: Take 0.5 tablets (20 mg total) by mouth daily.

## 2014-12-22 NOTE — Assessment & Plan Note (Signed)
Well-controlled over the weekend; monitor BP at home, continue current regimen; DASH guidelines

## 2014-12-22 NOTE — Patient Instructions (Signed)
Do continue to monitor your blood pressure Follow the DASH guidelines Try the oral appliance plus muscle relaxant at bedtime Do not mix with alcohol or pain medicines or sedatives Call with any issues Consider yoga or tai chi for stretching and stress release

## 2014-12-29 ENCOUNTER — Telehealth: Payer: Self-pay

## 2014-12-29 ENCOUNTER — Other Ambulatory Visit: Payer: Self-pay

## 2014-12-29 MED ORDER — CLOPIDOGREL BISULFATE 75 MG PO TABS: 75.0000 mg | ORAL_TABLET | Freq: Every day | ORAL | Status: DC

## 2014-12-29 MED ORDER — AMLODIPINE BESYLATE 2.5 MG PO TABS: 2.5000 mg | ORAL_TABLET | Freq: Every day | ORAL | Status: DC

## 2014-12-29 NOTE — Telephone Encounter (Signed)
Routing to provider  

## 2014-12-29 NOTE — Telephone Encounter (Signed)
Patient notified

## 2014-12-29 NOTE — Telephone Encounter (Signed)
I will add a very low dose of an additional medicine (amlodipine 2.5 mg daily) This is in addition to her other medicines As always, work on smoking cessation, weight loss, DASH guidelines Since this is the most recent BP med to be added, this would be the first one I would stop when her pressures come down Monitor and call with any problems

## 2014-12-29 NOTE — Telephone Encounter (Signed)
Patient states that her blood pressure still is not coming down.  Patient states that she increased her BP medication as instructed but that her BP is still reading at 140/90.  Patient also states that she is leaving for [place] in a few days and would like to have this issue resolved before she goes out of town.  Please call to advise.

## 2014-12-29 NOTE — Telephone Encounter (Signed)
She would like a 90 day supply

## 2015-01-02 ENCOUNTER — Telehealth: Payer: Self-pay | Admitting: Family Medicine

## 2015-01-02 MED ORDER — METOPROLOL SUCCINATE ER 50 MG PO TB24: 50.0000 mg | ORAL_TABLET | Freq: Every day | ORAL | Status: DC

## 2015-01-02 NOTE — Telephone Encounter (Signed)
Patient notified. She drinks 2-3 cups of coffee, wants to know if that is too much She also states she is not sleeping, stressed and anxious about her BP. Wants to know if you can possible renew her old Xanax rx, she hasn't had it in a long time.  She leaves for [place] on Sunday and is just very worried.

## 2015-01-02 NOTE — Telephone Encounter (Signed)
Routing to provider  

## 2015-01-02 NOTE — Telephone Encounter (Signed)
Increase toprol to 50 mg I sent in new Rx Monitor BP and pulse daily Appt if not coming down in 2 weeks

## 2015-01-02 NOTE — Telephone Encounter (Signed)
BP still not decreasing last reading 131/98. Been in high 90's low 100's.  She is leaving for [place] soon and is getting nervous.

## 2015-01-02 NOTE — Telephone Encounter (Signed)
Patient notified

## 2015-01-02 NOTE — Telephone Encounter (Signed)
Two six ounce cups of coffee (12 ounces total a day) would be my recommended limit I do not want to give her Xanax (that is beer in a pill) Stress reduction, relaxation response Have her check out video on YouTube for the relaxation response by Dr. Leonidas Romberg

## 2015-02-27 ENCOUNTER — Encounter: Payer: Self-pay | Admitting: Family Medicine

## 2015-02-27 ENCOUNTER — Ambulatory Visit (INDEPENDENT_AMBULATORY_CARE_PROVIDER_SITE_OTHER): Payer: Commercial Indemnity | Admitting: Family Medicine

## 2015-02-27 VITALS — BP 138/98 | HR 66 | Temp 98.6°F | Ht 65.0 in | Wt 197.0 lb

## 2015-02-27 DIAGNOSIS — I10 Essential (primary) hypertension: Secondary | ICD-10-CM | POA: Diagnosis not present

## 2015-02-27 DIAGNOSIS — Z955 Presence of coronary angioplasty implant and graft: Secondary | ICD-10-CM

## 2015-02-27 DIAGNOSIS — Z720 Tobacco use: Secondary | ICD-10-CM

## 2015-02-27 DIAGNOSIS — I2510 Atherosclerotic heart disease of native coronary artery without angina pectoris: Secondary | ICD-10-CM

## 2015-02-27 DIAGNOSIS — E669 Obesity, unspecified: Secondary | ICD-10-CM

## 2015-02-27 DIAGNOSIS — E785 Hyperlipidemia, unspecified: Secondary | ICD-10-CM | POA: Diagnosis not present

## 2015-02-27 DIAGNOSIS — I2583 Coronary atherosclerosis due to lipid rich plaque: Secondary | ICD-10-CM

## 2015-02-27 MED ORDER — ROSUVASTATIN CALCIUM 20 MG PO TABS: 10.0000 mg | ORAL_TABLET | Freq: Every day | ORAL | Status: DC

## 2015-02-27 MED ORDER — AMLODIPINE BESYLATE 5 MG PO TABS: 5.0000 mg | ORAL_TABLET | Freq: Every day | ORAL | Status: DC

## 2015-02-27 MED ORDER — VENLAFAXINE HCL ER 75 MG PO CP24: 75.0000 mg | ORAL_CAPSULE | Freq: Every day | ORAL | Status: DC

## 2015-02-27 MED ORDER — LOSARTAN POTASSIUM-HCTZ 100-25 MG PO TABS: 1.0000 | ORAL_TABLET | Freq: Every day | ORAL | Status: DC

## 2015-02-27 MED ORDER — CLOPIDOGREL BISULFATE 75 MG PO TABS: 75.0000 mg | ORAL_TABLET | Freq: Every day | ORAL | Status: DC

## 2015-02-27 MED ORDER — METOPROLOL SUCCINATE ER 50 MG PO TB24: 50.0000 mg | ORAL_TABLET | Freq: Every day | ORAL | Status: DC

## 2015-02-27 NOTE — Patient Instructions (Addendum)
Increase the amlodipine to 5 mg daily Continue the other blood pressure medicines Work on weight loss Check out the information at [employer] entitled "What It Takes to Lose Weight" Try to lose between 1-2 pounds per week by taking in fewer calories and burning off more calories You can succeed by limiting portions, limiting foods dense in calories and fat, becoming more active, and drinking 8 glasses of water a day (64 ounces) Don't skip meals, especially breakfast, as skipping meals may alter your metabolism Do not use over-the-counter weight loss pills or gimmicks that claim rapid weight loss A healthy BMI (or body mass index) is between 18.5 and 24.9 You can calculate your ideal BMI at the [place] website [employer] We need to get your weight down to 179 pounds or less Try to DASH guidelines Try just 10 mg of Crestor at bedtime; if still having aches, stop it for two weeks, and then restart at 10 mg every OTHER night; call me if that is not tolerated Let's have her return in 1 month for BP recheck Try to quit smoking and know that I am here to help if/when you are ready  Smoking Cessation, Tips for Success If you are ready to quit smoking, congratulations! You have chosen to help yourself be healthier. Cigarettes bring nicotine, tar, carbon monoxide, and other irritants into your body. Your lungs, heart, and blood vessels will be able to work better without these poisons. There are many different ways to quit smoking. Nicotine gum, nicotine patches, a nicotine inhaler, or nicotine nasal spray can help with physical craving. Hypnosis, support groups, and medicines help break the habit of smoking. WHAT THINGS CAN I DO TO MAKE QUITTING EASIER?  Here are some tips to help you quit for good:  Pick a date when you will quit smoking completely. Tell all of your friends and family about your plan to quit on that date.  Do not try to slowly  cut down on the number of cigarettes you are smoking. Pick a quit date and quit smoking completely starting on that day.  Throw away all cigarettes.   Clean and remove all ashtrays from your home, work, and car.  On a card, write down your reasons for quitting. Carry the card with you and read it when you get the urge to smoke.  Cleanse your body of nicotine. Drink enough water and fluids to keep your urine clear or pale yellow. Do this after quitting to flush the nicotine from your body.  Learn to predict your moods. Do not let a bad situation be your excuse to have a cigarette. Some situations in your life might tempt you into wanting a cigarette.  Never have "just one" cigarette. It leads to wanting another and another. Remind yourself of your decision to quit.  Change habits associated with smoking. If you smoked while driving or when feeling stressed, try other activities to replace smoking. Stand up when drinking your coffee. Brush your teeth after eating. Sit in a different chair when you read the paper. Avoid alcohol while trying to quit, and try to drink fewer caffeinated beverages. Alcohol and caffeine may urge you to smoke.  Avoid foods and drinks that can trigger a desire to smoke, such as sugary or spicy foods and alcohol.  Ask people who smoke not to smoke around you.  Have something planned to do right after eating or having a cup of coffee. For example, plan to take a walk or exercise.  Try a  relaxation exercise to calm you down and decrease your stress. Remember, you may be tense and nervous for the first 2 weeks after you quit, but this will pass.  Find new activities to keep your hands busy. Play with a pen, coin, or rubber band. Doodle or draw things on paper.  Brush your teeth right after eating. This will help cut down on the craving for the taste of tobacco after meals. You can also try mouthwash.   Use oral substitutes in place of cigarettes. Try using lemon  drops, carrots, cinnamon sticks, or chewing gum. Keep them handy so they are available when you have the urge to smoke.  When you have the urge to smoke, try deep breathing.  Designate your home as a nonsmoking area.  If you are a heavy smoker, ask your health care provider about a prescription for nicotine chewing gum. It can ease your withdrawal from nicotine.  Reward yourself. Set aside the cigarette money you save and buy yourself something nice.  Look for support from others. Join a support group or smoking cessation program. Ask someone at home or at work to help you with your plan to quit smoking.  Always ask yourself, "Do I need this cigarette or is this just a reflex?" Tell yourself, "Today, I choose not to smoke," or "I do not want to smoke." You are reminding yourself of your decision to quit.  Do not replace cigarette smoking with electronic cigarettes (commonly called e-cigarettes). The safety of e-cigarettes is unknown, and some may contain harmful chemicals.  If you relapse, do not give up! Plan ahead and think about what you will do the next time you get the urge to smoke. HOW WILL I FEEL WHEN I QUIT SMOKING? You may have symptoms of withdrawal because your body is used to nicotine (the addictive substance in cigarettes). You may crave cigarettes, be irritable, feel very hungry, cough often, get headaches, or have difficulty concentrating. The withdrawal symptoms are only temporary. They are strongest when you first quit but will go away within 10-14 days. When withdrawal symptoms occur, stay in control. Think about your reasons for quitting. Remind yourself that these are signs that your body is healing and getting used to being without cigarettes. Remember that withdrawal symptoms are easier to treat than the major diseases that smoking can cause.  Even after the withdrawal is over, expect periodic urges to smoke. However, these cravings are generally short lived and will go away  whether you smoke or not. Do not smoke! WHAT RESOURCES ARE AVAILABLE TO HELP ME QUIT SMOKING? Your health care provider can direct you to community resources or hospitals for support, which may include:  Group support.  Education.  Hypnosis.  Therapy.   This information is not intended to replace advice given to you by your health care provider. Make sure you discuss any questions you have with your health care provider.   Document Released: 01/15/2004 Document Revised: 05/09/2014 Document Reviewed: 10/04/2012 Elsevier Interactive Patient Education [employer].

## 2015-02-27 NOTE — Assessment & Plan Note (Addendum)
Will try the lower dose Crestor, since she did not tolerate 40 mg Crestor; will have step down from 20 mg which she had tried earlier to 10 mg nightly; if 10 mg nightly causes symptoms, then stop for 2 weeks and restart at 10 mg every other night; call with problems; weight loss also important, as well as increasing activity gradually as tolerated

## 2015-02-27 NOTE — Assessment & Plan Note (Signed)
Increase the amlodipine; work on weight loss, try DASH guidelines

## 2015-02-27 NOTE — Assessment & Plan Note (Addendum)
The 250 pound weight entered in August was erroneous; she is down two pounds since April; stressed the importance of weight loss; calculated weight to reach BMI less than 30 (179 pounds), and weight to reach healthy BMI (149 pounds); see AVS for tips; discussed not skipping meals, drinking 64 ounces of water a day, etc.; return in one month to see how she's doing; she will increase her activity, but I told her to start low and go slow, do not try to do 45 minutes on a treadmill first thing and have a heart attack

## 2015-02-27 NOTE — Assessment & Plan Note (Addendum)
Continue plavix; asymptomatic; work on smoking cessation, BP control, lipid management; she will increase her activity, but I told her to start low and go slow, do not try to do 45 minutes on a treadmill first thing and have a heart attack

## 2015-02-27 NOTE — Progress Notes (Signed)
BP 144/98 mmHg  Pulse 66  Temp(Src) 98.6 F (37 C)  Ht 5\' 5"  (1.651 m)  Wt 197 lb (89.359 kg)  BMI 32.78 kg/m2  SpO2 99%   Subjective:    Patient ID: Maria Franco, female    DOB: 1961-01-14, 54 y.o.   MRN: [id number]  HPI: Maria Franco is a 54 y.o. female  Chief Complaint  Patient presents with  . Follow-up  . Hypertension    Patient says that it's going good.    She has high blood pressure She is checking it at home; 135/84 on 83 or something, about two weeks ago No decongestants; does add salt to her food; not checking labels, but not buying much processed foods No black licorice No low blood pressures Taking medicine every day  Obesity; was 223 in 2010; she was 199 in April; watching what she is eating  She refuses flu shot  She has a brown nevus on her abdomen, flaky  effexor 75 mg for depression; working well; wants to stay on it  She quit the Crestor, her cardiologist bumped her up to 40 mg and her knees hurt; not taking any statin now;  Taking plavix, was told to take rest of her life;  Smoking; 1/2 to 1 ppd, depends on the day; she is not ready to quit right now  Relevant past medical, surgical, family and social history reviewed and updated as indicated. Interim medical history since our last visit reviewed. Allergies and medications reviewed and updated.  Review of Systems  Constitutional: Negative for unexpected weight change.  HENT: Negative for nosebleeds.   Respiratory: Negative for shortness of breath.   Cardiovascular: Negative for chest pain, palpitations and leg swelling.  Gastrointestinal: Negative for blood in stool.  Genitourinary: Negative for hematuria.   Per HPI unless specifically indicated above     Objective:    BP 144/98 mmHg  Pulse 66  Temp(Src) 98.6 F (37 C)  Ht 5\' 5"  (1.651 m)  Wt 197 lb (89.359 kg)  BMI 32.78 kg/m2  SpO2 99%  Wt Readings from Last 3 Encounters:  02/27/15 197 lb (89.359 kg)  12/22/14 197 lb 9.6 oz  (89.631 kg)  12/17/14 250 lb (113.399 kg)  * she did NOT weight 250 pounds in August; this was per ER note *  Filed Vitals:   02/27/15 0931  BP: 138/98  Pulse:   Temp:   recheck  Physical Exam  Constitutional: She appears well-developed and well-nourished. No distress.  Weight stable  HENT:  Head: Normocephalic and atraumatic.  Eyes: EOM are normal. No scleral icterus.  Neck: No thyromegaly present.  Cardiovascular: Normal rate, regular rhythm and normal heart sounds.   No murmur heard. Pulmonary/Chest: Effort normal and breath sounds normal. No respiratory distress. She has no wheezes.  Abdominal: Soft. She exhibits no distension.  Musculoskeletal: Normal range of motion. She exhibits no edema.  Neurological: She is alert. She exhibits normal muscle tone.  Skin: Skin is warm and dry. She is not diaphoretic. No pallor.  Psychiatric: She has a normal mood and affect. Her behavior is normal. Judgment and thought content normal.   Results for orders placed or performed in visit on 78/24/23  Basic Metabolic Panel (BMET)  Result Value Ref Range   Glucose 109 (H) 65 - 99 mg/dL   BUN 9 6 - 24 mg/dL   Creatinine, Ser 0.82 0.57 - 1.00 mg/dL   GFR calc non Af Amer 83 >59 mL/min/1.73   GFR calc  Af Amer 95 >59 mL/min/1.73   BUN/Creatinine Ratio 11 9 - 23   Sodium 140 134 - 144 mmol/L   Potassium 3.9 3.5 - 5.2 mmol/L   Chloride 97 97 - 108 mmol/L   CO2 26 18 - 29 mmol/L   Calcium 9.6 8.7 - 10.2 mg/dL      Assessment & Plan:   Problem List Items Addressed This Visit      Cardiovascular and Mediastinum   Hypertension - Primary (Chronic)    Increase the amlodipine; work on weight loss, try DASH guidelines      Relevant Medications   amLODipine (NORVASC) 5 MG tablet   metoprolol succinate (TOPROL-XL) 50 MG 24 hr tablet   losartan-hydrochlorothiazide (HYZAAR) 100-25 MG tablet   rosuvastatin (CRESTOR) 20 MG tablet   CAD (coronary artery disease)    Continue plavix;  asymptomatic; work on smoking cessation, BP control, lipid management; she will increase her activity, but I told her to start low and go slow, do not try to do 45 minutes on a treadmill first thing and have a heart attack      Relevant Medications   amLODipine (NORVASC) 5 MG tablet   metoprolol succinate (TOPROL-XL) 50 MG 24 hr tablet   losartan-hydrochlorothiazide (HYZAAR) 100-25 MG tablet   rosuvastatin (CRESTOR) 20 MG tablet     Other   Hyperlipidemia    Will try the lower dose Crestor, since she did not tolerate 40 mg Crestor; will have step down from 20 mg which she had tried earlier to 10 mg nightly; if 10 mg nightly causes symptoms, then stop for 2 weeks and restart at 10 mg every other night; call with problems; weight loss also important, as well as increasing activity gradually as tolerated      Relevant Medications   amLODipine (NORVASC) 5 MG tablet   metoprolol succinate (TOPROL-XL) 50 MG 24 hr tablet   losartan-hydrochlorothiazide (HYZAAR) 100-25 MG tablet   rosuvastatin (CRESTOR) 20 MG tablet   Obesity    The 250 pound weight entered in August was erroneous; she is down two pounds since April; stressed the importance of weight loss; calculated weight to reach BMI less than 30 (179 pounds), and weight to reach healthy BMI (149 pounds); see AVS for tips; discussed not skipping meals, drinking 64 ounces of water a day, etc.; return in one month to see how she's doing; she will increase her activity, but I told her to start low and go slow, do not try to do 45 minutes on a treadmill first thing and have a heart attack      Tobacco use    She is not ready to quit; she unfortunately refused her flu shot today      Status post coronary artery stent placement    Continue plavix; rx sent         Follow up plan: Return in about 1 month (around 03/30/2015).  Meds ordered this encounter  Medications  . amLODipine (NORVASC) 5 MG tablet    Sig: Take 1 tablet (5 mg total) by  mouth daily.    Dispense:  90 tablet    Refill:  3  . venlafaxine XR (EFFEXOR-XR) 75 MG 24 hr capsule    Sig: Take 1 capsule (75 mg total) by mouth daily with breakfast.    Dispense:  90 capsule    Refill:  3  . metoprolol succinate (TOPROL-XL) 50 MG 24 hr tablet    Sig: Take 1 tablet (50 mg total) by  mouth daily. Take with or immediately following a meal.    Dispense:  90 tablet    Refill:  3    New higher dose  . losartan-hydrochlorothiazide (HYZAAR) 100-25 MG tablet    Sig: Take 1 tablet by mouth daily.    Dispense:  90 tablet    Refill:  1  . clopidogrel (PLAVIX) 75 MG tablet    Sig: Take 1 tablet (75 mg total) by mouth daily.    Dispense:  90 tablet    Refill:  3  . rosuvastatin (CRESTOR) 20 MG tablet    Sig: Take 0.5 tablets (10 mg total) by mouth daily.    Dispense:  45 tablet    Refill:  0    She refused flu shot

## 2015-02-27 NOTE — Assessment & Plan Note (Signed)
Continue plavix; rx sent

## 2015-02-27 NOTE — Assessment & Plan Note (Addendum)
She is not ready to quit; she unfortunately refused her flu shot today

## 2015-03-30 ENCOUNTER — Ambulatory Visit: Payer: Managed Care, Other (non HMO) | Admitting: Family Medicine

## 2015-04-07 ENCOUNTER — Encounter: Payer: Self-pay | Admitting: Family Medicine

## 2015-04-07 ENCOUNTER — Ambulatory Visit (INDEPENDENT_AMBULATORY_CARE_PROVIDER_SITE_OTHER): Payer: Managed Care, Other (non HMO) | Admitting: Family Medicine

## 2015-04-07 VITALS — BP 131/85 | HR 76 | Temp 98.4°F | Wt 201.0 lb

## 2015-04-07 DIAGNOSIS — I2583 Coronary atherosclerosis due to lipid rich plaque: Secondary | ICD-10-CM

## 2015-04-07 DIAGNOSIS — I10 Essential (primary) hypertension: Secondary | ICD-10-CM | POA: Diagnosis not present

## 2015-04-07 DIAGNOSIS — E669 Obesity, unspecified: Secondary | ICD-10-CM | POA: Diagnosis not present

## 2015-04-07 DIAGNOSIS — Z1211 Encounter for screening for malignant neoplasm of colon: Secondary | ICD-10-CM | POA: Insufficient documentation

## 2015-04-07 DIAGNOSIS — I2510 Atherosclerotic heart disease of native coronary artery without angina pectoris: Secondary | ICD-10-CM

## 2015-04-07 DIAGNOSIS — Z720 Tobacco use: Secondary | ICD-10-CM | POA: Diagnosis not present

## 2015-04-07 DIAGNOSIS — E785 Hyperlipidemia, unspecified: Secondary | ICD-10-CM

## 2015-04-07 DIAGNOSIS — Z1239 Encounter for other screening for malignant neoplasm of breast: Secondary | ICD-10-CM

## 2015-04-07 NOTE — Patient Instructions (Addendum)
If you have not received information or the collection kit for the Cologuard in two weeks, please call Amy here at the office  Avoid salt substitutes  Return for fasting labs this week or next  Try to follow the DASH guidelines; monitor your blood pressure just occasionally and call if over 140/90  Please do call to schedule your mammogram; the number to schedule one at either [facility] or [facility] is [phone]   DASH Eating Plan DASH stands for "Dietary Approaches to Stop Hypertension." The DASH eating plan is a healthy eating plan that has been shown to reduce high blood pressure (hypertension). Additional health benefits may include reducing the risk of type 2 diabetes mellitus, heart disease, and stroke. The DASH eating plan may also help with weight loss. WHAT DO I NEED TO KNOW ABOUT THE DASH EATING PLAN? For the DASH eating plan, you will follow these general guidelines:  Choose foods with a percent daily value for sodium of less than 5% (as listed on the food label).  Use salt-free seasonings or herbs instead of table salt or sea salt.  Check with your health care provider or pharmacist before using salt substitutes.  Eat lower-sodium products, often labeled as "lower sodium" or "no salt added."  Eat fresh foods.  Eat more vegetables, fruits, and low-fat dairy products.  Choose whole grains. Look for the word "whole" as the first word in the ingredient list.  Choose fish and skinless chicken or [place] more often than red meat. Limit fish, poultry, and meat to 6 oz (170 g) each day.  Limit sweets, desserts, sugars, and sugary drinks.  Choose heart-healthy fats.  Limit cheese to 1 oz (28 g) per day.  Eat more home-cooked food and less restaurant, buffet, and fast food.  Limit fried foods.  Cook foods using methods other than frying.  Limit canned vegetables. If you do use them, rinse them well to decrease the sodium.  When eating  at a restaurant, ask that your food be prepared with less salt, or no salt if possible. WHAT FOODS CAN I EAT? Seek help from a dietitian for individual calorie needs. Grains Whole grain or whole wheat bread. Brown rice. Whole grain or whole wheat pasta. Quinoa, bulgur, and whole grain cereals. Low-sodium cereals. Corn or whole wheat flour tortillas. Whole grain cornbread. Whole grain crackers. Low-sodium crackers. Vegetables Fresh or frozen vegetables (raw, steamed, roasted, or grilled). Low-sodium or reduced-sodium tomato and vegetable juices. Low-sodium or reduced-sodium tomato sauce and paste. Low-sodium or reduced-sodium canned vegetables.  Fruits All fresh, canned (in natural juice), or frozen fruits. Meat and Other Protein Products Ground beef (85% or leaner), grass-fed beef, or beef trimmed of fat. Skinless chicken or [place]. Ground chicken or [place]. Pork trimmed of fat. All fish and seafood. Eggs. Dried beans, peas, or lentils. Unsalted nuts and seeds. Unsalted canned beans. Dairy Low-fat dairy products, such as skim or 1% milk, 2% or reduced-fat cheeses, low-fat ricotta or cottage cheese, or plain low-fat yogurt. Low-sodium or reduced-sodium cheeses. Fats and Oils Tub margarines without trans fats. Light or reduced-fat mayonnaise and salad dressings (reduced sodium). Avocado. Safflower, olive, or canola oils. Natural peanut or almond butter. Other Unsalted popcorn and pretzels. The items listed above may not be a complete list of recommended foods or beverages. Contact your dietitian for more options. WHAT FOODS ARE NOT RECOMMENDED? Grains White bread. White pasta. White rice. Refined cornbread. Bagels and croissants. Crackers that contain trans fat. Vegetables Creamed or  fried vegetables. Vegetables in a cheese sauce. Regular canned vegetables. Regular canned tomato sauce and paste. Regular tomato and vegetable juices. Fruits Dried fruits. Canned fruit in light or heavy syrup.  Fruit juice. Meat and Other Protein Products Fatty cuts of meat. Ribs, chicken wings, bacon, sausage, bologna, salami, chitterlings, fatback, hot dogs, bratwurst, and packaged luncheon meats. Salted nuts and seeds. Canned beans with salt. Dairy Whole or 2% milk, cream, half-and-half, and cream cheese. Whole-fat or sweetened yogurt. Full-fat cheeses or blue cheese. Nondairy creamers and whipped toppings. Processed cheese, cheese spreads, or cheese curds. Condiments Onion and garlic salt, seasoned salt, table salt, and sea salt. Canned and packaged gravies. Worcestershire sauce. Tartar sauce. Barbecue sauce. Teriyaki sauce. Soy sauce, including reduced sodium. Steak sauce. Fish sauce. Oyster sauce. Cocktail sauce. Horseradish. Ketchup and mustard. Meat flavorings and tenderizers. Bouillon cubes. Hot sauce. Tabasco sauce. Marinades. Taco seasonings. Relishes. Fats and Oils Butter, stick margarine, lard, shortening, ghee, and bacon fat. Coconut, palm kernel, or palm oils. Regular salad dressings. Other Pickles and olives. Salted popcorn and pretzels. The items listed above may not be a complete list of foods and beverages to avoid. Contact your dietitian for more information. WHERE CAN I FIND MORE INFORMATION? National Heart, Lung, and Blood Institute: travelstabloid.com   This information is not intended to replace advice given to you by your health care provider. Make sure you discuss any questions you have with your health care provider.   Document Released: 04/07/2011 Document Revised: 05/09/2014 Document Reviewed: 02/20/2013 Elsevier Interactive Patient Education [employer].

## 2015-04-07 NOTE — Progress Notes (Signed)
BP 131/85 mmHg  Pulse 76  Temp(Src) 98.4 F (36.9 C)  Wt 201 lb (91.173 kg)  SpO2 96%   Subjective:    Patient ID: Maria Franco, female    DOB: November 17, 1960, 54 y.o.   MRN: [id number]  HPI: Maria Franco is a 54 y.o. female  Chief Complaint  Patient presents with  . Hypertension  . Cologuard    she is interested in doing this instead of the colonoscopy  . Hyperlipidemia   She is here for hypertension; she saw Dr. Humphrey Rolls last week, 118/80 or so at cardiologist last week; he was pleased; she is trying to limit salt, does cook with it, but did make french onion soup last night, low sodium beef broth fat free; not using salt substitutes  She joined the gym, [employer], work-out room and treadmill; there about an hour three days a week Her 35th high school reunion in the summer, and goal is to lose 40 pounds to 50 pounds I asked about eating challenges; overeating is the issue; she cooks healthy and thinks it's portions that are the problem  She is open to having a Cologuard instead of colonoscopy; no blood in the stool, no abdominal pain; never had a colonoscopy and does not want one  She has high cholesterol; on 20 mg Crestor daily; she will return for fasting labs; back on that dose for about week now; off for a month or so  Relevant past medical, surgical, family and social history reviewed and updated as indicated. Interim medical history since our last visit reviewed. Allergies and medications reviewed and updated.  Review of Systems  Constitutional: Negative for unexpected weight change (weight gain, but related to portion sizes).  Cardiovascular: Negative for chest pain and leg swelling.  Gastrointestinal: Negative for blood in stool.  Genitourinary: Negative for hematuria.  Per HPI unless specifically indicated above     Objective:    BP 131/85 mmHg  Pulse 76  Temp(Src) 98.4 F (36.9 C)  Wt 201 lb (91.173 kg)  SpO2 96%  Wt Readings from Last 3 Encounters:   04/07/15 201 lb (91.173 kg)  02/27/15 197 lb (89.359 kg)  12/22/14 197 lb 9.6 oz (89.631 kg)  body mass index is 33.45 kg/(m^2).  Physical Exam  Constitutional: She appears well-developed and well-nourished.  Weight up four pounds over last 3-1/2 months; obese  Eyes: EOM are normal. No scleral icterus.  Neck: No JVD present. No thyromegaly present.  Cardiovascular: Normal rate and regular rhythm.   Pulmonary/Chest: Effort normal and breath sounds normal.  Abdominal: She exhibits no distension.  Musculoskeletal: She exhibits no edema.  Neurological: She is alert.  Skin: No erythema. No pallor.  Psychiatric: She has a normal mood and affect. Her behavior is normal. Judgment and thought content normal.   Results for orders placed or performed in visit on [id number]  Basic Metabolic Panel (BMET)  Result Value Ref Range   Glucose 109 (H) 65 - 99 mg/dL   BUN 9 6 - 24 mg/dL   Creatinine, Ser 0.82 0.57 - 1.00 mg/dL   GFR calc non Af Amer 83 >59 mL/min/1.73   GFR calc Af Amer 95 >59 mL/min/1.73   BUN/Creatinine Ratio 11 9 - 23   Sodium 140 134 - 144 mmol/L   Potassium 3.9 3.5 - 5.2 mmol/L   Chloride 97 97 - 108 mmol/L   CO2 26 18 - 29 mmol/L   Calcium 9.6 8.7 - 10.2 mg/dL  Assessment & Plan:   Problem List Items Addressed This Visit      Cardiovascular and Mediastinum   Hypertension - Primary (Chronic)    DASH guidelines encouraged; see after visit summary; weight loss encouraged; continue meds      CAD (coronary artery disease)    Followed by cardiologist, Dr. Humphrey Rolls; work on weight, controlling BP and lipids; she will start to exercise, building up gradually        Other   Hyperlipidemia    On statin again; check lipids and sgpt (alt); work on weight loss, healthy eating with less saturated fats; activity (build up gradually) will also help      Obesity    Encouragement given for weight loss; start slow and increase gradually with exercise; portion control       Tobacco use    Patient continues to smoke; she is unfortunately not ready to quit smoking      Colon cancer screening    Patient does not want to have a colonoscopy done, but is open to cologuard screening; ordered      Relevant Orders   Cologuard   Breast cancer screening    Mammogram ordered; patient to call and schedule her own appt      Relevant Orders   MM DIGITAL SCREENING BILATERAL      Follow up plan: Return 1-2 weeks for labs, for 6 months for next visit with Dr. Sanda Klein.  An after-visit summary was printed and given to the patient at [place].  Please see the patient instructions which may contain other information and recommendations beyond what is mentioned above in the assessment and plan.  Orders Placed This Encounter  Procedures  . MM DIGITAL SCREENING BILATERAL  . Cologuard   Face-to-face time with patient was more than 25 minutes, >50% time spent counseling and coordination of care

## 2015-04-12 NOTE — Assessment & Plan Note (Signed)
Patient does not want to have a colonoscopy done, but is open to cologuard screening; ordered

## 2015-04-12 NOTE — Assessment & Plan Note (Signed)
DASH guidelines encouraged; see after visit summary; weight loss encouraged; continue meds

## 2015-04-12 NOTE — Assessment & Plan Note (Signed)
On statin again; check lipids and sgpt (alt); work on weight loss, healthy eating with less saturated fats; activity (build up gradually) will also help

## 2015-04-12 NOTE — Assessment & Plan Note (Signed)
Patient continues to smoke; she is unfortunately not ready to quit smoking

## 2015-04-12 NOTE — Assessment & Plan Note (Signed)
Encouragement given for weight loss; start slow and increase gradually with exercise; portion control

## 2015-04-12 NOTE — Assessment & Plan Note (Signed)
Followed by cardiologist, Dr. Humphrey Rolls; work on weight, controlling BP and lipids; she will start to exercise, building up gradually

## 2015-04-12 NOTE — Assessment & Plan Note (Signed)
Mammogram ordered; patient to call and schedule her own appt

## 2015-04-15 ENCOUNTER — Other Ambulatory Visit: Payer: Managed Care, Other (non HMO)

## 2015-04-17 ENCOUNTER — Other Ambulatory Visit: Payer: Managed Care, Other (non HMO)

## 2015-04-17 ENCOUNTER — Other Ambulatory Visit: Payer: Self-pay

## 2015-04-17 DIAGNOSIS — I10 Essential (primary) hypertension: Secondary | ICD-10-CM

## 2015-04-17 DIAGNOSIS — E785 Hyperlipidemia, unspecified: Secondary | ICD-10-CM

## 2015-04-23 ENCOUNTER — Other Ambulatory Visit: Payer: Managed Care, Other (non HMO)

## 2015-04-23 DIAGNOSIS — E785 Hyperlipidemia, unspecified: Secondary | ICD-10-CM

## 2015-04-23 DIAGNOSIS — I10 Essential (primary) hypertension: Secondary | ICD-10-CM

## 2015-04-24 ENCOUNTER — Encounter: Payer: Self-pay | Admitting: Family Medicine

## 2015-04-24 LAB — BASIC METABOLIC PANEL
BUN / CREAT RATIO: 14 (ref 9–23)
BUN: 11 mg/dL (ref 6–24)
CALCIUM: 9.4 mg/dL (ref 8.7–10.2)
CHLORIDE: 99 mmol/L (ref 96–106)
CO2: 28 mmol/L (ref 18–29)
Creatinine, Ser: 0.8 mg/dL (ref 0.57–1.00)
GFR calc non Af Amer: 85 mL/min/{1.73_m2} (ref >59)
GFR, EST AFRICAN AMERICAN: 98 mL/min/{1.73_m2} (ref >59)
Glucose: 87 mg/dL (ref 65–99)
POTASSIUM: 4.2 mmol/L (ref 3.5–5.2)
SODIUM: 140 mmol/L (ref 134–144)

## 2015-04-24 LAB — LIPID PANEL W/O CHOL/HDL RATIO
Cholesterol, Total: 144 mg/dL (ref 100–199)
HDL: 41 mg/dL (ref >39)
LDL CALC: 67 mg/dL (ref 0–99)
Triglycerides: 180 mg/dL — ABNORMAL HIGH (ref 0–149)
VLDL Cholesterol Cal: 36 mg/dL (ref 5–40)

## 2015-04-24 LAB — ALT: ALT: 21 IU/L (ref 0–32)

## 2015-07-08 ENCOUNTER — Telehealth: Payer: Self-pay | Admitting: Family Medicine

## 2015-07-08 NOTE — Telephone Encounter (Signed)
Patient notified. She also states that hasn't had a cigarette in almost 2 weeks, she is doing the e cigs now.

## 2015-07-08 NOTE — Telephone Encounter (Signed)
Left message to call.

## 2015-07-08 NOTE — Telephone Encounter (Signed)
Just following up on Cologuard; records show kit was mailed; please call pt and ask her to please do the test and return the kit as soon as convenient for her It's very important to screen for colon cancer, as it's the 3rd leading cause of cancer death in women; we want to help her stay on top of her health Thank you 

## 2015-09-11 ENCOUNTER — Other Ambulatory Visit: Payer: Self-pay | Admitting: Family Medicine

## 2015-10-06 ENCOUNTER — Ambulatory Visit: Payer: Managed Care, Other (non HMO) | Admitting: Family Medicine

## 2015-10-08 ENCOUNTER — Other Ambulatory Visit: Payer: Self-pay | Admitting: Family Medicine

## 2015-10-08 NOTE — Telephone Encounter (Signed)
Please clarify dose of metoprolol (Toprol) with patient We received a refill request for 25 mg strength from her pharmacy Our records show that she should be taking 50 mg, and a year's worth of that was sent in on 02/27/2015

## 2015-10-09 NOTE — Telephone Encounter (Signed)
Pt told me to call her pharmacy they state also 50 mg and patient also wanted to let you know she has stopped smoking but is vaping

## 2015-10-09 NOTE — Telephone Encounter (Signed)
Thank you :)

## 2015-10-30 ENCOUNTER — Encounter: Payer: Self-pay | Admitting: Family Medicine

## 2015-10-30 ENCOUNTER — Ambulatory Visit (INDEPENDENT_AMBULATORY_CARE_PROVIDER_SITE_OTHER): Payer: BLUE CROSS/BLUE SHIELD | Admitting: Family Medicine

## 2015-10-30 VITALS — BP 126/82 | HR 76 | Temp 98.7°F | Resp 16 | Wt 212.0 lb

## 2015-10-30 DIAGNOSIS — Z720 Tobacco use: Secondary | ICD-10-CM

## 2015-10-30 DIAGNOSIS — Z5181 Encounter for therapeutic drug level monitoring: Secondary | ICD-10-CM

## 2015-10-30 DIAGNOSIS — I2510 Atherosclerotic heart disease of native coronary artery without angina pectoris: Secondary | ICD-10-CM

## 2015-10-30 DIAGNOSIS — E785 Hyperlipidemia, unspecified: Secondary | ICD-10-CM

## 2015-10-30 DIAGNOSIS — I10 Essential (primary) hypertension: Secondary | ICD-10-CM

## 2015-10-30 DIAGNOSIS — I2583 Coronary atherosclerosis due to lipid rich plaque: Principal | ICD-10-CM

## 2015-10-30 DIAGNOSIS — F419 Anxiety disorder, unspecified: Secondary | ICD-10-CM

## 2015-10-30 MED ORDER — HYDROXYZINE PAMOATE 25 MG PO CAPS: 25.0000 mg | ORAL_CAPSULE | Freq: Four times a day (QID) | ORAL | Status: DC | PRN

## 2015-10-30 NOTE — Assessment & Plan Note (Addendum)
Check lipids, she'll return fasting; continue statin; explained that CoQ-10 and RYR extract not as effective as statin; she'll continue statin; see AVS for dietary recommendations

## 2015-10-30 NOTE — Progress Notes (Signed)
BP 126/82 mmHg  Pulse 76  Temp(Src) 98.7 F (37.1 C) (Oral)  Resp 16  Wt 212 lb (96.163 kg)  SpO2 97%   Subjective:    Patient ID: Maria Franco, female    DOB: November 12, 1960, 55 y.o.   MRN: [id number]  HPI: Maria Franco is a 55 y.o. female  Chief Complaint  Patient presents with  . Follow-up    6 months   She is here for follow-up; well-known to me from previous practice  HTN; checks BP away from her; stays pretty good; 125-130 over 80-85 Don't use much salt, himalayan salt; not much; pasta and potatoes  Dr. Humphrey Rolls is her cardiologist; due for stress testing soon; has CAD  She is interested to know if she can use [place] RYR extract and CoQ10 instead of Crestor; after our discussion, she will continue the Crestor; no problems with it, just asking  She QUIT SMOKING!! 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 Depression   . Hypertension   . Hyperlipidemia   . Panic attacks   . 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 Adrenal mass, left ([place])     followed by Endocrine, hormone testing q 5 years and CT q 1-2 years  . Obesity   . CAD (coronary artery disease)   . Tricuspid valve regurgitation   . MVP (mitral valve prolapse)   . Presence of stent in LAD coronary artery    Past Surgical History  Procedure Laterality Date  . Hernia repair      at 73 yrs of age  . Coronary  angioplasty with stent placement  2006  . Tubal ligation  2012  . Endometrial ablation  2012   Family History  Problem Relation Age of Onset  . Adopted: Yes  . Family history unknown: Yes   Social History  Substance Use Topics  . Smoking status: Former Research scientist (life sciences)  . Smokeless tobacco: Current User     Comment: vaper  . Alcohol Use: 0.0 oz/week    0 Standard drinks or equivalent per week     Comment: Occasionally   Interim medical history since last visit reviewed. Allergies and medications reviewed  Review of Systems Per HPI unless specifically indicated above     Objective:    BP 126/82 mmHg  Pulse 76  Temp(Src) 98.7 F (37.1 C) (Oral)  Resp 16  Wt 212 lb (96.163 kg)  SpO2 97%  Wt Readings from Last 3 Encounters:  10/30/15 212 lb (96.163 kg)  04/07/15 201 lb (91.173 kg)  02/27/15 197 lb (89.359 kg)   body mass index is 35.28 kg/(m^2).  Physical Exam  Constitutional: She appears well-developed and well-nourished. No distress.  obese  HENT:  Head: Normocephalic and atraumatic.  Eyes: EOM are normal. No scleral icterus.  Neck: No thyromegaly present.  Cardiovascular: Normal  rate, regular rhythm and normal heart sounds.   No murmur heard. Pulmonary/Chest: Effort normal and breath sounds normal. She has no wheezes.  Abdominal: Soft. Bowel sounds are normal. She exhibits no distension.  Musculoskeletal: Normal range of motion. She exhibits no edema.  Neurological: She is alert. She exhibits normal muscle tone.  Skin: Skin is warm and dry. She is not diaphoretic. No pallor.  Psychiatric: She has a normal mood and affect. Her behavior is normal. Judgment and thought content normal.   Results for orders placed or performed in visit on [id number]  Basic metabolic panel  Result Value Ref Range   Glucose 87 65 - 99 mg/dL   BUN 11 6 - 24 mg/dL   Creatinine, Ser 0.80 0.57 - 1.00 mg/dL   GFR calc non Af Amer 85 >59 mL/min/1.73   GFR calc Af Amer 98 >59 mL/min/1.73    BUN/Creatinine Ratio 14 9 - 23   Sodium 140 134 - 144 mmol/L   Potassium 4.2 3.5 - 5.2 mmol/L   Chloride 99 96 - 106 mmol/L   CO2 28 18 - 29 mmol/L   Calcium 9.4 8.7 - 10.2 mg/dL  Lipid Panel w/o Chol/HDL Ratio  Result Value Ref Range   Cholesterol, Total 144 100 - 199 mg/dL   Triglycerides 180 (H) 0 - 149 mg/dL   HDL 41 >39 mg/dL   VLDL Cholesterol Cal 36 5 - 40 mg/dL   LDL Calculated 67 0 - 99 mg/dL  ALT  Result Value Ref Range   ALT 21 0 - 32 IU/L      Assessment & Plan:   Problem List Items Addressed This Visit      Cardiovascular and Mediastinum   Hypertension (Chronic)    Usually under good control away from doctor; recheck pressure here in the office much improved relative to check-in; continue medicine, DASH guidelines      CAD (coronary artery disease) - Primary (Chronic)    Check lipids, patient to return fasting; goal LDL under 70; continue aspirin; f/u with cardiologist for stress testing soon      Relevant Orders   Lipid panel     Other   Tobacco use    She has stopped cigarettes for the most part, but is using nicotine vapor cigarettes; cautioned about bronchiolitis obliterans risk with some flavored e-cigs; copy of warning given; hopefully, she will be able to quit all nicotine completely      Medication monitoring encounter    Check SGPT      Relevant Orders   CBC with Differential/Platelet   Comprehensive metabolic panel   Hyperlipidemia    Check lipids, she'll return fasting; continue statin; explained that CoQ-10 and RYR extract not as effective as statin; she'll continue statin; see AVS for dietary recommendations      Relevant Orders   Lipid panel   Anxiety    Patient has occasional anxiety; I really do not want to renew her Xanax; I explained risks associated with benzos, showed her the Dec 31, 2014 FDA warning; suggested she try hydroxyzine instead; I do not plan on renewing benzo      Relevant Medications   hydrOXYzine (VISTARIL) 25 MG  capsule      Follow-up plan: Return in about 7 months (around 05/16/2016) for follow-up and fasting labs with Dr. Sanda Klein.  An after-visit summary was printed and given to the patient at [place].  Please see the patient instructions which may contain other information and recommendations beyond what is mentioned  above in the assessment and plan.  Meds ordered this encounter  Medications  . hydrOXYzine (VISTARIL) 25 MG capsule    Sig: Take 1 capsule (25 mg total) by mouth every 6 (six) hours as needed. For anxiety    Dispense:  30 capsule    Refill:  0   Orders Placed This Encounter  Procedures  . CBC with Differential/Platelet  . Lipid panel  . Comprehensive metabolic panel

## 2015-10-30 NOTE — Assessment & Plan Note (Addendum)
Check lipids, patient to return fasting; goal LDL under 70; continue aspirin; f/u with cardiologist for stress testing soon

## 2015-10-30 NOTE — Patient Instructions (Addendum)
Check to see if the juice contains the harmful chemicals outlined in the article  Try to limit saturated fats in your diet (bologna, hot dogs, barbeque, cheeseburgers, hamburgers, steak, bacon, sausage, cheese, etc.) and get more fresh fruits, vegetables, and whole grains  Your goal blood pressure is less than 140 mmHg on top. Try to follow the DASH guidelines (DASH stands for Dietary Approaches to Stop Hypertension) Try to limit the sodium in your diet.  Ideally, consume less than 1.5 grams (less than 1,500mg ) per day. Do not add salt when cooking or at the table.  Check the sodium amount on labels when shopping, and choose items lower in sodium when given a choice. Avoid or limit foods that already contain a lot of sodium. Eat a diet rich in fruits and vegetables and whole grains.  Please have fasting labs at your convenience  Try the hydroxyzine for anxiety if needed

## 2015-10-30 NOTE — Assessment & Plan Note (Signed)
Check SGPT 

## 2015-10-31 ENCOUNTER — Encounter: Payer: Self-pay | Admitting: Family Medicine

## 2015-10-31 DIAGNOSIS — F419 Anxiety disorder, unspecified: Secondary | ICD-10-CM | POA: Insufficient documentation

## 2015-10-31 NOTE — Assessment & Plan Note (Signed)
Usually under good control away from doctor; recheck pressure here in the office much improved relative to check-in; continue medicine, DASH guidelines

## 2015-10-31 NOTE — Assessment & Plan Note (Signed)
Patient has occasional anxiety; I really do not want to renew her Xanax; I explained risks associated with benzos, showed her the Dec 31, 2014 FDA warning; suggested she try hydroxyzine instead; I do not plan on renewing benzo

## 2015-10-31 NOTE — Assessment & Plan Note (Signed)
She has stopped cigarettes for the most part, but is using nicotine vapor cigarettes; cautioned about bronchiolitis obliterans risk with some flavored e-cigs; copy of warning given; hopefully, she will be able to quit all nicotine completely

## 2015-12-18 ENCOUNTER — Telehealth: Payer: Self-pay | Admitting: Family Medicine

## 2015-12-18 DIAGNOSIS — I10 Essential (primary) hypertension: Secondary | ICD-10-CM | POA: Diagnosis not present

## 2015-12-18 DIAGNOSIS — I2510 Atherosclerotic heart disease of native coronary artery without angina pectoris: Secondary | ICD-10-CM | POA: Diagnosis not present

## 2015-12-18 DIAGNOSIS — E782 Mixed hyperlipidemia: Secondary | ICD-10-CM | POA: Diagnosis not present

## 2015-12-18 DIAGNOSIS — R079 Chest pain, unspecified: Secondary | ICD-10-CM | POA: Diagnosis not present

## 2015-12-18 NOTE — Telephone Encounter (Signed)
done faxed to Dr. Chancy Milroy

## 2015-12-25 DIAGNOSIS — I10 Essential (primary) hypertension: Secondary | ICD-10-CM | POA: Diagnosis not present

## 2015-12-25 DIAGNOSIS — R079 Chest pain, unspecified: Secondary | ICD-10-CM | POA: Diagnosis not present

## 2015-12-25 DIAGNOSIS — I2510 Atherosclerotic heart disease of native coronary artery without angina pectoris: Secondary | ICD-10-CM | POA: Diagnosis not present

## 2015-12-25 DIAGNOSIS — E782 Mixed hyperlipidemia: Secondary | ICD-10-CM | POA: Diagnosis not present

## 2016-01-26 ENCOUNTER — Encounter: Payer: Self-pay | Admitting: Family Medicine

## 2016-03-02 ENCOUNTER — Other Ambulatory Visit: Payer: Self-pay

## 2016-03-03 MED ORDER — ROSUVASTATIN CALCIUM 10 MG PO TABS: 10.0000 mg | ORAL_TABLET | Freq: Every day | ORAL | 0 refills | Status: DC

## 2016-03-03 NOTE — Telephone Encounter (Signed)
Patient is well overdue for labs; I ordered bloodwork in June; please ask her to have her labs done fsating in the next week; I'll allow a limited supply, but we really need to monitor her liver and kidneys and make sure the dose is correct and lipids are at goal; thank you

## 2016-03-04 NOTE — Telephone Encounter (Signed)
Called pt regarding refill request. Called pt and left a detail voicemail regarding dosage and labs.

## 2016-06-06 DIAGNOSIS — E782 Mixed hyperlipidemia: Secondary | ICD-10-CM | POA: Diagnosis not present

## 2016-06-06 DIAGNOSIS — I2510 Atherosclerotic heart disease of native coronary artery without angina pectoris: Secondary | ICD-10-CM | POA: Diagnosis not present

## 2016-06-06 DIAGNOSIS — I10 Essential (primary) hypertension: Secondary | ICD-10-CM | POA: Diagnosis not present

## 2016-06-10 DIAGNOSIS — R072 Precordial pain: Secondary | ICD-10-CM | POA: Diagnosis not present

## 2016-06-17 ENCOUNTER — Encounter: Payer: Self-pay | Admitting: Family Medicine

## 2016-06-17 DIAGNOSIS — E782 Mixed hyperlipidemia: Secondary | ICD-10-CM | POA: Diagnosis not present

## 2016-06-17 DIAGNOSIS — I2510 Atherosclerotic heart disease of native coronary artery without angina pectoris: Secondary | ICD-10-CM | POA: Diagnosis not present

## 2016-06-17 DIAGNOSIS — I10 Essential (primary) hypertension: Secondary | ICD-10-CM | POA: Diagnosis not present

## 2016-06-26 ENCOUNTER — Other Ambulatory Visit: Payer: Self-pay | Admitting: Family Medicine

## 2016-06-26 NOTE — Telephone Encounter (Signed)
Patient needs labs and an appointment please I'll allow limited amount of meds See previous note from March 02, 2016: Me      12:11 PM  Note    Patient is well overdue for labs; I ordered bloodwork in June; please ask her to have her labs done fsating in the next week; I'll allow a limited supply, but we really need to monitor her liver and kidneys and make sure the dose is correct and lipids are at goal; thank you    March 02, 2016

## 2016-06-27 ENCOUNTER — Other Ambulatory Visit: Payer: Self-pay

## 2016-06-27 DIAGNOSIS — Z5181 Encounter for therapeutic drug level monitoring: Secondary | ICD-10-CM

## 2016-06-27 DIAGNOSIS — I2583 Coronary atherosclerosis due to lipid rich plaque: Secondary | ICD-10-CM | POA: Diagnosis not present

## 2016-06-27 DIAGNOSIS — I2510 Atherosclerotic heart disease of native coronary artery without angina pectoris: Secondary | ICD-10-CM

## 2016-06-27 DIAGNOSIS — E785 Hyperlipidemia, unspecified: Secondary | ICD-10-CM | POA: Diagnosis not present

## 2016-06-27 MED ORDER — ROSUVASTATIN CALCIUM 10 MG PO TABS: 10.0000 mg | ORAL_TABLET | Freq: Every day | ORAL | 0 refills | Status: DC

## 2016-06-27 NOTE — Telephone Encounter (Signed)
Patient notified will have them done next week at work and will have them fax to her.  Also states needs limited supply of chol med

## 2016-06-28 ENCOUNTER — Other Ambulatory Visit: Payer: Self-pay | Admitting: Family Medicine

## 2016-06-28 LAB — CBC WITH DIFFERENTIAL/PLATELET
BASOS ABS: 0 {cells}/uL (ref 0–200)
Basophils Relative: 0 %
EOS ABS: 128 {cells}/uL (ref 15–500)
Eosinophils Relative: 2 %
HCT: 41 % (ref 35.0–45.0)
HEMOGLOBIN: 13.6 g/dL (ref 11.7–15.5)
LYMPHS PCT: 20 %
Lymphs Abs: 1280 cells/uL (ref 850–3900)
MCH: 30.8 pg (ref 27.0–33.0)
MCHC: 33.2 g/dL (ref 32.0–36.0)
MCV: 92.8 fL (ref 80.0–100.0)
MONO ABS: 640 {cells}/uL (ref 200–950)
MPV: 9.7 fL (ref 7.5–12.5)
Monocytes Relative: 10 %
Neutro Abs: 4352 cells/uL (ref 1500–7800)
Neutrophils Relative %: 68 %
PLATELETS: 191 10*3/uL (ref 140–400)
RBC: 4.42 MIL/uL (ref 3.80–5.10)
RDW: 13 % (ref 11.0–15.0)
WBC: 6.4 10*3/uL (ref 3.8–10.8)

## 2016-06-28 LAB — COMPLETE METABOLIC PANEL WITH GFR
ALBUMIN: 4.2 g/dL (ref 3.6–5.1)
ALK PHOS: 71 U/L (ref 33–130)
ALT: 27 U/L (ref 6–29)
AST: 20 U/L (ref 10–35)
BILIRUBIN TOTAL: 0.4 mg/dL (ref 0.2–1.2)
BUN: 13 mg/dL (ref 7–25)
CALCIUM: 9.4 mg/dL (ref 8.6–10.4)
CHLORIDE: 103 mmol/L (ref 98–110)
CO2: 30 mmol/L (ref 20–31)
Creat: 0.88 mg/dL (ref 0.50–1.05)
GFR, EST AFRICAN AMERICAN: 87 mL/min (ref >=60)
GFR, Est Non African American: 75 mL/min (ref >=60)
Glucose, Bld: 98 mg/dL (ref 65–99)
Potassium: 3.7 mmol/L (ref 3.5–5.3)
Sodium: 142 mmol/L (ref 135–146)
TOTAL PROTEIN: 6.7 g/dL (ref 6.1–8.1)

## 2016-06-28 LAB — LIPID PANEL
CHOLESTEROL: 109 mg/dL (ref <200)
HDL: 44 mg/dL — ABNORMAL LOW (ref >50)
LDL Cholesterol: 42 mg/dL (ref <100)
TRIGLYCERIDES: 117 mg/dL (ref <150)
Total CHOL/HDL Ratio: 2.5 Ratio (ref <5.0)
VLDL: 23 mg/dL (ref <30)

## 2016-06-28 MED ORDER — LOSARTAN POTASSIUM-HCTZ 100-25 MG PO TABS: 1.0000 | ORAL_TABLET | Freq: Every day | ORAL | 6 refills | Status: DC

## 2016-06-28 MED ORDER — CLOPIDOGREL BISULFATE 75 MG PO TABS: 75.0000 mg | ORAL_TABLET | Freq: Every day | ORAL | 11 refills | Status: DC

## 2016-06-28 MED ORDER — METOPROLOL SUCCINATE ER 50 MG PO TB24: 50.0000 mg | ORAL_TABLET | Freq: Every day | ORAL | 11 refills | Status: DC

## 2016-06-28 MED ORDER — ROSUVASTATIN CALCIUM 10 MG PO TABS: 10.0000 mg | ORAL_TABLET | Freq: Every day | ORAL | 6 refills | Status: DC

## 2016-06-28 MED ORDER — AMLODIPINE BESYLATE 5 MG PO TABS: 5.0000 mg | ORAL_TABLET | Freq: Every day | ORAL | 11 refills | Status: DC

## 2016-06-28 NOTE — Telephone Encounter (Signed)
Pt did lab work on today 06-28-16 and is asking that you please send results to Dr Neoma Laming [facility]). Fax [phone]. She did fill out a medical release and I have it at my desk.

## 2016-06-29 MED ORDER — METOPROLOL SUCCINATE ER 50 MG PO TB24: 50.0000 mg | ORAL_TABLET | Freq: Every day | ORAL | 3 refills | Status: DC

## 2016-06-29 MED ORDER — LOSARTAN POTASSIUM-HCTZ 100-25 MG PO TABS: 1.0000 | ORAL_TABLET | Freq: Every day | ORAL | 3 refills | Status: DC

## 2016-06-29 NOTE — Telephone Encounter (Signed)
Pt state she will ask Dr. Humphrey Rolls to continue to monitor and refill her Crestor medication.  Confirm From patient the dosage and route for the BP medications are right.

## 2016-06-29 NOTE — Telephone Encounter (Signed)
Pt stated Dr. Humphrey Rolls increased her Crestor to 40 mg 2 weeks ago, she is asking why did you decrease it to 10 mg?. Also she state that she will need 90 day supply for all the refills you sent:  Crestor, Toprol and her losartan because its cheaper; she is asking if you can resend it as a 90 day supply to [facility]. She asked me to fax over her results to Dr. Humphrey Rolls and send her the lab results. I will do that today.

## 2016-06-29 NOTE — Telephone Encounter (Signed)
Please update Crestor then to reflect what she's taking She'll want to get refills of Crestor from Dr. Humphrey Rolls; this is why it's always best to have just one doctor managing and monitoring a medicine I'm sorry that I didn't know the dose had changed We'll have Dr. Humphrey Rolls follow her cholesterol and prescribe her Crestor from now on I'll send 90 day supplies of the BP meds for her (please confirm those are correct) Thank you

## 2016-06-29 NOTE — Telephone Encounter (Signed)
Amber already gave patient results and will be faxing labs to Dr.

## 2016-07-28 DIAGNOSIS — I10 Essential (primary) hypertension: Secondary | ICD-10-CM | POA: Diagnosis not present

## 2016-07-28 DIAGNOSIS — I2510 Atherosclerotic heart disease of native coronary artery without angina pectoris: Secondary | ICD-10-CM | POA: Diagnosis not present

## 2016-07-28 DIAGNOSIS — E782 Mixed hyperlipidemia: Secondary | ICD-10-CM | POA: Diagnosis not present

## 2016-09-12 DIAGNOSIS — M722 Plantar fascial fibromatosis: Secondary | ICD-10-CM | POA: Diagnosis not present

## 2016-09-12 DIAGNOSIS — M79671 Pain in right foot: Secondary | ICD-10-CM | POA: Diagnosis not present

## 2016-09-12 DIAGNOSIS — M79672 Pain in left foot: Secondary | ICD-10-CM | POA: Diagnosis not present

## 2016-09-27 DIAGNOSIS — M79672 Pain in left foot: Secondary | ICD-10-CM | POA: Diagnosis not present

## 2016-09-27 DIAGNOSIS — M722 Plantar fascial fibromatosis: Secondary | ICD-10-CM | POA: Diagnosis not present

## 2016-09-27 DIAGNOSIS — M79671 Pain in right foot: Secondary | ICD-10-CM | POA: Diagnosis not present

## 2016-12-15 ENCOUNTER — Ambulatory Visit (INDEPENDENT_AMBULATORY_CARE_PROVIDER_SITE_OTHER): Payer: BLUE CROSS/BLUE SHIELD | Admitting: Family Medicine

## 2016-12-15 ENCOUNTER — Encounter: Payer: Self-pay | Admitting: Family Medicine

## 2016-12-15 VITALS — BP 116/74 | HR 84 | Temp 97.6°F | Resp 14 | Ht 65.0 in | Wt 183.5 lb

## 2016-12-15 DIAGNOSIS — I2583 Coronary atherosclerosis due to lipid rich plaque: Secondary | ICD-10-CM | POA: Diagnosis not present

## 2016-12-15 DIAGNOSIS — Z5181 Encounter for therapeutic drug level monitoring: Secondary | ICD-10-CM

## 2016-12-15 DIAGNOSIS — E782 Mixed hyperlipidemia: Secondary | ICD-10-CM

## 2016-12-15 DIAGNOSIS — E279 Disorder of adrenal gland, unspecified: Secondary | ICD-10-CM | POA: Diagnosis not present

## 2016-12-15 DIAGNOSIS — I10 Essential (primary) hypertension: Secondary | ICD-10-CM

## 2016-12-15 DIAGNOSIS — I2510 Atherosclerotic heart disease of native coronary artery without angina pectoris: Secondary | ICD-10-CM | POA: Diagnosis not present

## 2016-12-15 DIAGNOSIS — Z1231 Encounter for screening mammogram for malignant neoplasm of breast: Secondary | ICD-10-CM

## 2016-12-15 DIAGNOSIS — F419 Anxiety disorder, unspecified: Secondary | ICD-10-CM | POA: Diagnosis not present

## 2016-12-15 DIAGNOSIS — Z1211 Encounter for screening for malignant neoplasm of colon: Secondary | ICD-10-CM | POA: Diagnosis not present

## 2016-12-15 DIAGNOSIS — Z1239 Encounter for other screening for malignant neoplasm of breast: Secondary | ICD-10-CM

## 2016-12-15 DIAGNOSIS — E278 Other specified disorders of adrenal gland: Secondary | ICD-10-CM

## 2016-12-15 MED ORDER — VENLAFAXINE HCL ER 75 MG PO CP24: 75.0000 mg | ORAL_CAPSULE | Freq: Every day | ORAL | 5 refills | Status: DC

## 2016-12-15 MED ORDER — VENLAFAXINE HCL ER 37.5 MG PO CP24: 37.5000 mg | ORAL_CAPSULE | Freq: Every day | ORAL | 0 refills | Status: DC

## 2016-12-15 MED ORDER — BUSPIRONE HCL 15 MG PO TABS: 7.5000 mg | ORAL_TABLET | Freq: Two times a day (BID) | ORAL | 1 refills | Status: DC

## 2016-12-15 NOTE — Patient Instructions (Addendum)
Let's get labs I imaging that as your blood pressure comes down, you can ask Dr. Humphrey Rolls about decreasing or stopping a BP medicine, such as amlodipine Start back on the Effexor Call me in 2 weeks with an update Start the buspar, and take that regularly as directed, and we increase or decrease as needed Try to limit saturated fats in your diet (bologna, hot dogs, barbeque, cheeseburgers, hamburgers, steak, bacon, sausage, cheese, etc.) and get more fresh fruits, vegetables, and whole grains We'll have you see the endocrinologist If you have not heard anything from my staff in a week about any orders/referrals/studies from today, please contact us here to follow-up [phone] Front staff -- please request last labs from Dr. Humphrey Rolls, maybe 3-4 months ago (anything [phone])   Steps to Elicit the Relaxation Response The following is the technique reprinted with permission from Dr. Billie Ruddy book The Relaxation Response pages 162-163 1. Sit quietly in a comfortable position. 2. Close your eyes. 3. Deeply relax all your muscles,  beginning at your feet and progressing up to your face.  Keep them relaxed. 4. Breathe through your nose.  Become aware of your breathing.  As you breathe out, say the word, "one"*,  silently to yourself. For example,  breathe in ... out, "one",- in .. out, "one", etc.  Breathe easily and naturally. 5. Continue for 10 to 20 minutes.  You may open your eyes to check the time, but do not use an alarm.  When you finish, sit quietly for several minutes,  at first with your eyes closed and later with your eyes opened.  Do not stand up for a few minutes. 6. Do not worry about whether you are successful  in achieving a deep level of relaxation.  Maintain a passive attitude and permit relaxation to occur at its own pace.  When distracting thoughts occur,  try to ignore them by not dwelling upon them  and return to repeating "one."  With practice, the response should  come with little effort.  Practice the technique once or twice daily,  but not within two hours after any meal,  since the digestive processes seem to interfere with  the elicitation of the Relaxation Response. * It is better to use a soothing, mellifluous sound, preferably with no meaning. or association, to avoid stimulation of unnecessary thoughts - a mantra.

## 2016-12-15 NOTE — Assessment & Plan Note (Signed)
Monitor SGPT

## 2016-12-15 NOTE — Assessment & Plan Note (Addendum)
Check lipids on another day (she will return fasting); on 40 mg of Crestor, no myalgias

## 2016-12-15 NOTE — Assessment & Plan Note (Signed)
Encouragement given to continue weight loss

## 2016-12-15 NOTE — Assessment & Plan Note (Signed)
We discussed counseling; start back on effexor

## 2016-12-15 NOTE — Assessment & Plan Note (Signed)
On aspirin and plavix and beta-blocker and statin

## 2016-12-15 NOTE — Assessment & Plan Note (Signed)
As she loses more weight, expect cardiologist will decrease medicine; try DASH guidelines

## 2016-12-15 NOTE — Progress Notes (Signed)
BP 116/74   Pulse 84   Temp 97.6 F (36.4 C) (Oral)   Resp 14   Ht 5\' 5"  (1.651 m)   Wt 183 lb 8 oz (83.2 kg)   SpO2 98%   BMI 30.54 kg/m    Subjective:    Patient ID: Maria Franco, female    DOB: May 18, 1960, 56 y.o.   MRN: [id number]  HPI: Maria Franco is a 56 y.o. female  Chief Complaint  Patient presents with  . Anxiety    POSS getting back on some medication     HPI  Patient is here for a visit She has not been seen in over a year; last visit October 30, 2015, note reviewed  She is losing weight with Weight Watchers; lost 33 pounds; goal is 160 pounds  She is considering taking the LSATs; stress at home, no abuse; has done counseling; she would like to get back on something to help  Since last visit, she has seen Dr. Elvina Mattes (podiatrist) for plantar fasciitis (May 2018)  Sees cardiologist; last EF was not reduced; has more blockage, than he expected in a few years, 30%; stent in LAD; aspirin and plavix and statin and beta-blocker; wonders about BP meds as losing weight  Left adrenal mass; was seeing endocrinologist; just never followed back up  Lab Results  Component Value Date   CHOL 109 06/27/2016   CHOL 144 04/23/2015   Lab Results  Component Value Date   HDL 44 (L) 06/27/2016   HDL 41 04/23/2015   Lab Results  Component Value Date   LDLCALC 42 06/27/2016   [place] 67 04/23/2015   Lab Results  Component Value Date   TRIG 117 06/27/2016   TRIG 180 (H) 04/23/2015   Lab Results  Component Value Date   CHOLHDL 2.5 06/27/2016   No results found for: LDLDIRECT   Depression screen [facility] 2/9 12/15/2016 10/30/2015  Decreased Interest 0 0  Down, Depressed, Hopeless 1 1  PHQ - 2 Score 1 1    Relevant past medical, surgical, family and social history reviewed Past Medical History:  Diagnosis Date  . Adrenal mass, left ([place])    followed by Endocrine, hormone testing q 5 years and CT q 1-2 years  . CAD (coronary artery disease)   . Depression   .  Hyperlipidemia   . Hypertension   . MVP (mitral valve prolapse)   . Obesity   . Panic attacks   . Plantar fascia syndrome   . Presence of stent in LAD coronary artery   . 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 Tricuspid valve regurgitation    Past Surgical History:  Procedure Laterality Date  . CORONARY ANGIOPLASTY WITH STENT PLACEMENT  2006  . ENDOMETRIAL ABLATION  2012  . HERNIA REPAIR     at 7 yrs of age  . TUBAL LIGATION  2012   Family History  Problem Relation Age of Onset  . Adopted: Yes  . Family history unknown: Yes   Social History   Social History  . Marital status: Married    Spouse name: N/A  . Number of children: N/A  . Years of education: N/A   Occupational History  . Not on file.   Social History Main Topics  . Smoking status: Current Some Day Smoker    Types: Cigarettes  . Smokeless tobacco: Current User     Comment: vaper  . Alcohol use 0.0 oz/week  Comment: Occasionally  . Drug use: No  . Sexual activity: Yes   Other Topics Concern  . Not on file   Social History Narrative  . No narrative on file    Interim medical history since last visit reviewed. Allergies and medications reviewed  Review of Systems Per HPI unless specifically indicated above     Objective:    BP 116/74   Pulse 84   Temp 97.6 F (36.4 C) (Oral)   Resp 14   Ht 5\' 5"  (1.651 m)   Wt 183 lb 8 oz (83.2 kg)   SpO2 98%   BMI 30.54 kg/m   Wt Readings from Last 3 Encounters:  12/15/16 183 lb 8 oz (83.2 kg)  10/30/15 212 lb (96.2 kg)  04/07/15 201 lb (91.2 kg)    Physical Exam  Constitutional: She appears well-developed and well-nourished.  Weight lost acknowledged  HENT:  Mouth/Throat: Mucous membranes are normal.  Eyes: EOM are normal. No scleral icterus.  Neck: No JVD present. No thyromegaly present.  Cardiovascular: Normal rate and regular rhythm.   Pulmonary/Chest: Effort normal and breath sounds normal.  Abdominal:  She exhibits no distension.  Musculoskeletal: She exhibits no edema.  Skin: No pallor.  Psychiatric: She has a normal mood and affect. Her behavior is normal.      Assessment & Plan:   Problem List Items Addressed This Visit      Cardiovascular and Mediastinum   Hypertension (Chronic)    As she loses more weight, expect cardiologist will decrease medicine; try DASH guidelines      Relevant Medications   rosuvastatin (CRESTOR) 40 MG tablet   CAD (coronary artery disease) (Chronic)    On aspirin and plavix and beta-blocker and statin      Relevant Medications   rosuvastatin (CRESTOR) 40 MG tablet     Other   Medication monitoring encounter    Monitor SGPT      Hyperlipidemia - Primary    Check lipids on another day (she will return fasting); on 40 mg of Crestor, no myalgias      Relevant Medications   rosuvastatin (CRESTOR) 40 MG tablet   Anxiety    We discussed counseling; start back on effexor      Relevant Medications   venlafaxine XR (EFFEXOR XR) 37.5 MG 24 hr capsule   venlafaxine XR (EFFEXOR XR) 75 MG 24 hr capsule   busPIRone (BUSPAR) 15 MG tablet   Adrenal mass, left ([place])    Refer back to endocrinologist      Relevant Orders   Ambulatory referral to Endocrinology    Other Visit Diagnoses    Screening for breast cancer       Relevant Orders   MM Digital Screening   Screening for colon cancer       Relevant Orders   Cologuard       Follow up plan: Return in about 6 weeks (around 01/25/2017) for follow-up visit with Dr. Sanda Klein.  An after-visit summary was printed and given to the patient at [place].  Please see the patient instructions which may contain other information and recommendations beyond what is mentioned above in the assessment and plan.  Meds ordered this encounter  Medications  . rosuvastatin (CRESTOR) 40 MG tablet    Sig: Take 40 mg by mouth every morning.  . venlafaxine XR (EFFEXOR XR) 37.5 MG 24 hr capsule    Sig: Take 1 capsule  (37.5 mg total) by mouth daily with breakfast. For 7 days, then  two a day    Dispense:  53 capsule    Refill:  0    Rx #1  . venlafaxine XR (EFFEXOR XR) 75 MG 24 hr capsule    Sig: Take 1 capsule (75 mg total) by mouth daily with breakfast.    Dispense:  30 capsule    Refill:  5    Rx #2  . busPIRone (BUSPAR) 15 MG tablet    Sig: Take 0.5 tablets (7.5 mg total) by mouth 2 (two) times daily.    Dispense:  30 tablet    Refill:  1    Orders Placed This Encounter  Procedures  . MM Digital Screening  . Cologuard  . Ambulatory referral to Endocrinology

## 2016-12-15 NOTE — Assessment & Plan Note (Signed)
Refer back to endocrinologist

## 2016-12-28 DIAGNOSIS — H5213 Myopia, bilateral: Secondary | ICD-10-CM | POA: Diagnosis not present

## 2017-01-06 DIAGNOSIS — E782 Mixed hyperlipidemia: Secondary | ICD-10-CM | POA: Diagnosis not present

## 2017-01-06 DIAGNOSIS — Z9861 Coronary angioplasty status: Secondary | ICD-10-CM | POA: Diagnosis not present

## 2017-01-06 DIAGNOSIS — I10 Essential (primary) hypertension: Secondary | ICD-10-CM | POA: Diagnosis not present

## 2017-01-06 DIAGNOSIS — I2510 Atherosclerotic heart disease of native coronary artery without angina pectoris: Secondary | ICD-10-CM | POA: Diagnosis not present

## 2017-01-24 ENCOUNTER — Encounter: Payer: Self-pay | Admitting: Family Medicine

## 2017-01-24 ENCOUNTER — Ambulatory Visit (INDEPENDENT_AMBULATORY_CARE_PROVIDER_SITE_OTHER): Payer: BLUE CROSS/BLUE SHIELD | Admitting: Family Medicine

## 2017-01-24 DIAGNOSIS — E663 Overweight: Secondary | ICD-10-CM | POA: Diagnosis not present

## 2017-01-24 DIAGNOSIS — I10 Essential (primary) hypertension: Secondary | ICD-10-CM

## 2017-01-24 DIAGNOSIS — F419 Anxiety disorder, unspecified: Secondary | ICD-10-CM

## 2017-01-24 MED ORDER — CLOPIDOGREL BISULFATE 75 MG PO TABS: 75.0000 mg | ORAL_TABLET | Freq: Every day | ORAL | 3 refills | Status: DC

## 2017-01-24 MED ORDER — AMLODIPINE BESYLATE 5 MG PO TABS: 5.0000 mg | ORAL_TABLET | Freq: Every day | ORAL | 3 refills | Status: DC

## 2017-01-24 MED ORDER — VENLAFAXINE HCL ER 75 MG PO CP24: 75.0000 mg | ORAL_CAPSULE | Freq: Every day | ORAL | 3 refills | Status: DC

## 2017-01-24 MED ORDER — ROSUVASTATIN CALCIUM 40 MG PO TABS: 40.0000 mg | ORAL_TABLET | ORAL | 1 refills | Status: DC

## 2017-01-24 NOTE — Progress Notes (Signed)
BP 128/74 (BP Location: Left Arm, Patient Position: Sitting, Cuff Size: Normal)   Pulse 87   Temp 98.6 F (37 C) (Oral)   Resp 14   Wt 180 lb 3.2 oz (81.7 kg)   SpO2 98%   BMI 29.99 kg/m    Subjective:    Patient ID: Maria Franco, female    DOB: 09-24-1960, 56 y.o.   MRN: [id number]  HPI: Maria Franco is a 56 y.o. female  Chief Complaint  Patient presents with  . Follow-up    anitdepresent meds    HPI Patient is here for f/u She was started on medicine at last visit six weeks ago Since last visit, doing well on new medicine No adverse effects from the medicine, no nausea, no headaches other than slight first few days When studying for the LSAT and doing logic games Took a practice exam, did a diagnostic one and scored 123 (120-180 is desired); last test was 142 Trying to get into the 150s on her LAST, maybe 153 Tolerating people and situations much better Much calmer No weight gain, actually lost a little bit; BMI under 30 Her goal is to get down to 160 pounds  Depression screen [facility] 2/9 01/24/2017 12/15/2016 10/30/2015  Decreased Interest 0 0 0  Down, Depressed, Hopeless 0 1 1  PHQ - 2 Score 0 1 1    Relevant past medical, surgical, family and social history reviewed Past Medical History:  Diagnosis Date  . Adrenal mass, left ([place])    followed by Endocrine, hormone testing q 5 years and CT q 1-2 years  . CAD (coronary artery disease)   . Depression   . Hyperlipidemia   . Hypertension   . MVP (mitral valve prolapse)   . Obesity   . Panic attacks   . Plantar fascia syndrome   . Presence of stent in LAD coronary artery   . 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 Tricuspid valve regurgitation    Past Surgical History:  Procedure Laterality Date  . CORONARY ANGIOPLASTY WITH STENT PLACEMENT  2006  . ENDOMETRIAL ABLATION  2012  . HERNIA REPAIR     at 7 yrs of age  . TUBAL LIGATION  2012   Family History  Problem Relation Age of  Onset  . Adopted: Yes  . Family history unknown: Yes   Social History   Social History  . Marital status: Married    Spouse name: N/A  . Number of children: N/A  . Years of education: N/A   Occupational History  . Not on file.   Social History Main Topics  . Smoking status: Current Some Day Smoker    Types: Cigarettes, E-cigarettes  . Smokeless tobacco: Current User     Comment: vaper  . Alcohol use 0.0 oz/week     Comment: Occasionally  . Drug use: No  . Sexual activity: Yes   Other Topics Concern  . Not on file   Social History Narrative  . No narrative on file    Interim medical history since last visit reviewed. Allergies and medications reviewed  Review of Systems Per HPI unless specifically indicated above     Objective:    BP 128/74 (BP Location: Left Arm, Patient Position: Sitting, Cuff Size: Normal)   Pulse 87   Temp 98.6 F (37 C) (Oral)   Resp 14   Wt 180 lb 3.2 oz (81.7 kg)   SpO2 98%   BMI 29.99  kg/m   Wt Readings from Last 3 Encounters:  01/24/17 180 lb 3.2 oz (81.7 kg)  12/15/16 183 lb 8 oz (83.2 kg)  10/30/15 212 lb (96.2 kg)    Physical Exam  Constitutional: She appears well-developed and well-nourished.  Cardiovascular: Normal rate and regular rhythm.   Pulmonary/Chest: Effort normal and breath sounds normal.  Neurological: She is alert.  Psychiatric: She has a normal mood and affect. Her behavior is normal. Judgment and thought content normal.       Assessment & Plan:   Problem List Items Addressed This Visit      Cardiovascular and Mediastinum   Hypertension (Chronic)    Well-controlled, even with the SNRI; continue current meds      Relevant Medications   amLODipine (NORVASC) 5 MG tablet   rosuvastatin (CRESTOR) 40 MG tablet     Other   Overweight (BMI 25.0-29.9)    Praise given for her ongoing weight loss efforts; calculated ideal BMI values; she will try to 160 as her target weight      Anxiety    Doing well on  current medicine; will continue; refills provided      Relevant Medications   busPIRone (BUSPAR) 15 MG tablet   venlafaxine XR (EFFEXOR XR) 75 MG 24 hr capsule       Follow up plan: No Follow-up on file.  An after-visit summary was printed and given to the patient at [place].  Please see the patient instructions which may contain other information and recommendations beyond what is mentioned above in the assessment and plan.  Meds ordered this encounter  Medications  . busPIRone (BUSPAR) 15 MG tablet    Sig: Take 0.5 tablets (7.5 mg total) by mouth 2 (two) times daily as needed.    Dispense:  30 tablet    Refill:  1  . amLODipine (NORVASC) 5 MG tablet    Sig: Take 1 tablet (5 mg total) by mouth daily.    Dispense:  90 tablet    Refill:  3  . clopidogrel (PLAVIX) 75 MG tablet    Sig: Take 1 tablet (75 mg total) by mouth daily.    Dispense:  90 tablet    Refill:  3  . venlafaxine XR (EFFEXOR XR) 75 MG 24 hr capsule    Sig: Take 1 capsule (75 mg total) by mouth daily with breakfast.    Dispense:  90 capsule    Refill:  3    Rx #2  . rosuvastatin (CRESTOR) 40 MG tablet    Sig: Take 1 tablet (40 mg total) by mouth every morning.    Dispense:  90 tablet    Refill:  1    No orders of the defined types were placed in this encounter.

## 2017-01-24 NOTE — Assessment & Plan Note (Signed)
Doing well on current medicine; will continue; refills provided

## 2017-01-24 NOTE — Patient Instructions (Signed)
Keep up the great job with your weight loss Continue the medicine

## 2017-01-24 NOTE — Assessment & Plan Note (Signed)
Well-controlled, even with the SNRI; continue current meds

## 2017-01-24 NOTE — Assessment & Plan Note (Addendum)
Praise given for her ongoing weight loss efforts; calculated ideal BMI values; she will try to 160 as her target weight

## 2017-02-14 ENCOUNTER — Telehealth: Payer: Self-pay

## 2017-02-14 NOTE — Telephone Encounter (Signed)
Received report from [facility] stating that they were going to cancel this pt's cologuard testing as it is approaching 30 days and they have not received pt's specimen. Called pt to ensure she was still planning to proceed with test. Pt gave verbalization that she was still planning to have cologuard testing performed.

## 2017-03-10 ENCOUNTER — Ambulatory Visit
Admission: RE | Admit: 2017-03-10 | Discharge: 2017-03-10 | Disposition: A | Discharge status: Home / Self Care | Payer: BLUE CROSS/BLUE SHIELD | Source: Ambulatory Visit | Attending: Family Medicine | Admitting: Family Medicine

## 2017-03-10 ENCOUNTER — Ambulatory Visit
Admission: RE | Admit: 2017-03-10 | Discharge: 2017-03-10 | Disposition: A | Discharge status: Home / Self Care | Payer: BLUE CROSS/BLUE SHIELD | Source: Ambulatory Visit | Attending: Internal Medicine | Admitting: Internal Medicine

## 2017-03-10 ENCOUNTER — Other Ambulatory Visit: Payer: Self-pay | Admitting: Family Medicine

## 2017-03-10 DIAGNOSIS — M25571 Pain in right ankle and joints of right foot: Secondary | ICD-10-CM | POA: Diagnosis not present

## 2017-03-10 DIAGNOSIS — R52 Pain, unspecified: Secondary | ICD-10-CM

## 2017-03-10 DIAGNOSIS — E279 Disorder of adrenal gland, unspecified: Secondary | ICD-10-CM | POA: Diagnosis not present

## 2017-03-10 DIAGNOSIS — M25579 Pain in unspecified ankle and joints of unspecified foot: Secondary | ICD-10-CM | POA: Diagnosis present

## 2017-03-14 DIAGNOSIS — E279 Disorder of adrenal gland, unspecified: Secondary | ICD-10-CM | POA: Diagnosis not present

## 2017-03-16 ENCOUNTER — Other Ambulatory Visit: Payer: Self-pay | Admitting: Internal Medicine

## 2017-03-16 DIAGNOSIS — E278 Other specified disorders of adrenal gland: Secondary | ICD-10-CM

## 2017-03-16 DIAGNOSIS — E279 Disorder of adrenal gland, unspecified: Principal | ICD-10-CM

## 2017-03-31 ENCOUNTER — Ambulatory Visit
Admission: RE | Admit: 2017-03-31 | Discharge: 2017-03-31 | Disposition: A | Discharge status: Home / Self Care | Payer: BLUE CROSS/BLUE SHIELD | Source: Ambulatory Visit | Attending: Internal Medicine | Admitting: Internal Medicine

## 2017-04-03 ENCOUNTER — Ambulatory Visit
Admission: RE | Admit: 2017-04-03 | Discharge: 2017-04-03 | Disposition: A | Discharge status: Home / Self Care | Payer: BLUE CROSS/BLUE SHIELD | Source: Ambulatory Visit | Attending: Internal Medicine | Admitting: Internal Medicine

## 2017-04-03 DIAGNOSIS — I700 Atherosclerosis of aorta: Secondary | ICD-10-CM | POA: Insufficient documentation

## 2017-04-03 DIAGNOSIS — E278 Other specified disorders of adrenal gland: Secondary | ICD-10-CM

## 2017-04-03 DIAGNOSIS — E279 Disorder of adrenal gland, unspecified: Secondary | ICD-10-CM | POA: Insufficient documentation

## 2017-06-09 DIAGNOSIS — I2510 Atherosclerotic heart disease of native coronary artery without angina pectoris: Secondary | ICD-10-CM | POA: Diagnosis not present

## 2017-06-09 DIAGNOSIS — E782 Mixed hyperlipidemia: Secondary | ICD-10-CM | POA: Diagnosis not present

## 2017-06-09 DIAGNOSIS — Z720 Tobacco use: Secondary | ICD-10-CM | POA: Diagnosis not present

## 2017-06-09 DIAGNOSIS — I10 Essential (primary) hypertension: Secondary | ICD-10-CM | POA: Diagnosis not present

## 2017-06-27 DIAGNOSIS — M25571 Pain in right ankle and joints of right foot: Secondary | ICD-10-CM | POA: Diagnosis not present

## 2017-07-18 DIAGNOSIS — G8929 Other chronic pain: Secondary | ICD-10-CM | POA: Diagnosis not present

## 2017-07-18 DIAGNOSIS — M25571 Pain in right ankle and joints of right foot: Secondary | ICD-10-CM | POA: Diagnosis not present

## 2017-07-24 ENCOUNTER — Encounter: Payer: Self-pay | Admitting: Family Medicine

## 2017-07-24 ENCOUNTER — Ambulatory Visit (INDEPENDENT_AMBULATORY_CARE_PROVIDER_SITE_OTHER): Payer: BLUE CROSS/BLUE SHIELD | Admitting: Family Medicine

## 2017-07-24 VITALS — BP 130/72 | HR 77 | Temp 98.5°F | Ht 65.0 in | Wt 183.4 lb

## 2017-07-24 DIAGNOSIS — E669 Obesity, unspecified: Secondary | ICD-10-CM

## 2017-07-24 DIAGNOSIS — R1031 Right lower quadrant pain: Secondary | ICD-10-CM

## 2017-07-24 DIAGNOSIS — I2583 Coronary atherosclerosis due to lipid rich plaque: Secondary | ICD-10-CM

## 2017-07-24 DIAGNOSIS — Z5181 Encounter for therapeutic drug level monitoring: Secondary | ICD-10-CM | POA: Diagnosis not present

## 2017-07-24 DIAGNOSIS — E782 Mixed hyperlipidemia: Secondary | ICD-10-CM | POA: Diagnosis not present

## 2017-07-24 DIAGNOSIS — Z720 Tobacco use: Secondary | ICD-10-CM | POA: Diagnosis not present

## 2017-07-24 DIAGNOSIS — I10 Essential (primary) hypertension: Secondary | ICD-10-CM

## 2017-07-24 DIAGNOSIS — I2510 Atherosclerotic heart disease of native coronary artery without angina pectoris: Secondary | ICD-10-CM

## 2017-07-24 NOTE — Patient Instructions (Addendum)
Try to follow the DASH guidelines (DASH stands for Dietary Approaches to Stop Hypertension). Try to limit the sodium in your diet to no more than 1,500mg  of sodium per day. Certainly try to not exceed 2,000 mg per day at the very most. Do not add salt when cooking or at the table.  Check the sodium amount on labels when shopping, and choose items lower in sodium when given a choice. Avoid or limit foods that already contain a lot of sodium. Eat a diet rich in fruits and vegetables and whole grains, and try to lose weight if overweight or obese  Try to limit saturated fats in your diet (bologna, hot dogs, barbeque, cheeseburgers, hamburgers, steak, bacon, sausage, cheese, etc.) and get more fresh fruits, vegetables, and whole grains  Check out the information at familydoctor.org entitled "Nutrition for Weight Loss: What You Need to Know about Fad Diets" Try to lose between 1-2 pounds per week by taking in fewer calories and burning off more calories You can succeed by limiting portions, limiting foods dense in calories and fat, becoming more active, and drinking 8 glasses of water a day (64 ounces) Don't skip meals, especially breakfast, as skipping meals may alter your metabolism Do not use over-the-counter weight loss pills or gimmicks that claim rapid weight loss A healthy BMI (or body mass index) is between 18.5 and 24.9 You can calculate your ideal BMI at the [place] website [employer]Mon[employer]fr  I do encourage you to quit smoking Call [phone] to sign up for smoking cessation classes You can call 1-800-QUIT-NOW to talk with a smoking cessation coach  Let's get labs on another day fasting

## 2017-07-24 NOTE — Progress Notes (Signed)
BP 130/72 (BP Location: Right Arm, Patient Position: Sitting, Cuff Size: Large)   Pulse 77   Temp 98.5 F (36.9 C) (Oral)   Ht '5\' 5"'  (1.651 m)   Wt 183 lb 6.4 oz (83.2 kg)   SpO2 99%   BMI 30.52 kg/m    Subjective:    Patient ID: Maria Franco, female    DOB: 03-Sep-1960, 57 y.o.   MRN: [id number]  HPI: Maria Franco is a 57 y.o. female  Chief Complaint  Patient presents with  . Follow-up    HPI Patient is here for f/u  High cholesterol; dietary indiscretion earlier today so will return for labs on another day Lab Results  Component Value Date   CHOL 109 06/27/2016   HDL 44 (L) 06/27/2016   LDLCALC 42 06/27/2016   TRIG 117 06/27/2016   CHOLHDL 2.5 06/27/2016   Coronary artery disease; seeing cardiologist; stent at age 53; no chest pain  Hypertension; checks BP on occasion, no highs or lows; knows to avoid extra salt; seasons with sea salt; no processed foods; on combo [id number]-[id number]; not part of recall  Obesity; maintaining right now; got down to 178 pounds; has 25 pounds to go; her lowest weight recently was about 176.9 pounds; politely declined nutrition  Smoking about the same; smoking 1/2 to 1 ppd; depends on stress; not using chemical vapes; first cig of the day if 30-45 minutes, showers first, coffee with cig; she can do, just needs to get through the LSAT  Right ankle has been hurting since October; Dr. Elvina Mattes podiatrist seeing her for that  RLQ / right pelvic discomfort; just started this week; dull pain; could feel it last night in bed; doesn't last long; no relief with BMs; appetite is good; no fevers; still has ovaries, still has appendix; having hot flashes, not new colonoscopy not UTD; still has ovaries; no hx of cysts  No blood in the urine or stool (unless really big bowel, with tear, will feel that); then back to norma; no gum or nose bleeding; on plavix  Depression screen [facility] 2/9 07/24/2017 01/24/2017 12/15/2016 10/30/2015  Decreased Interest 0 0 0 0  Down,  Depressed, Hopeless 0 0 1 1  PHQ - 2 Score 0 0 1 1    Relevant past medical, surgical, family and social history reviewed Past Medical History:  Diagnosis Date  . Adrenal mass, left ([place])    followed by Endocrine, hormone testing q 5 years and CT q 1-2 years  . CAD (coronary artery disease)   . Depression   . Hyperlipidemia   . Hypertension   . MVP (mitral valve prolapse)   . Obesity   . Panic attacks   . Plantar fascia syndrome   . Presence of stent in LAD coronary artery   . 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 Tricuspid valve regurgitation    Past Surgical History:  Procedure Laterality Date  . CORONARY ANGIOPLASTY WITH STENT PLACEMENT  2006  . ENDOMETRIAL ABLATION  2012  . HERNIA REPAIR     at 7 yrs of age  . TUBAL LIGATION  2012   Family History  Adopted: Yes  Family history unknown: Yes   Social History   Tobacco Use  . Smoking status: Current Some Day Smoker    Types: Cigarettes, E-cigarettes  . Smokeless tobacco: Current User  . Tobacco comment: vaper  Substance Use Topics  . Alcohol use: Yes    Alcohol/week: 0.0 oz  Comment: Occasionally  . Drug use: No    Interim medical history since last visit reviewed. Allergies and medications reviewed  Review of Systems Per HPI unless specifically indicated above     Objective:    BP 130/72 (BP Location: Right Arm, Patient Position: Sitting, Cuff Size: Large)   Pulse 77   Temp 98.5 F (36.9 C) (Oral)   Ht '5\' 5"'  (1.651 m)   Wt 183 lb 6.4 oz (83.2 kg)   SpO2 99%   BMI 30.52 kg/m   Wt Readings from Last 3 Encounters:  07/24/17 183 lb 6.4 oz (83.2 kg)  01/24/17 180 lb 3.2 oz (81.7 kg)  12/15/16 183 lb 8 oz (83.2 kg)    Physical Exam  Constitutional: She appears well-developed and well-nourished. No distress.  HENT:  Head: Normocephalic and atraumatic.  Eyes: EOM are normal. No scleral icterus.  Neck: No thyromegaly present.  Cardiovascular: Normal rate, regular  rhythm and normal heart sounds.  No murmur heard. Pulmonary/Chest: Effort normal and breath sounds normal. No respiratory distress. She has no wheezes.  Abdominal: Soft. Bowel sounds are normal. She exhibits no distension.  Musculoskeletal: Normal range of motion. She exhibits no edema.  Neurological: She is alert. She exhibits normal muscle tone.  Skin: Skin is warm and dry. She is not diaphoretic. No pallor.  Psychiatric: She has a normal mood and affect. Her behavior is normal. Judgment and thought content normal.    Results for orders placed or performed in visit on 06/27/16  CBC with Differential/Platelet  Result Value Ref Range   WBC 6.4 3.8 - 10.8 K/uL   RBC 4.42 3.80 - 5.10 MIL/uL   Hemoglobin 13.6 11.7 - 15.5 g/dL   HCT 41.0 35.0 - 45.0 %   MCV 92.8 80.0 - 100.0 fL   MCH 30.8 27.0 - 33.0 pg   MCHC 33.2 32.0 - 36.0 g/dL   RDW 13.0 11.0 - 15.0 %   Platelets 191 140 - 400 K/uL   MPV 9.7 7.5 - 12.5 fL   Neutro Abs 4,352 1,500 - 7,800 cells/uL   Lymphs Abs 1,280 850 - 3,900 cells/uL   Monocytes Absolute 640 200 - 950 cells/uL   Eosinophils Absolute 128 15 - 500 cells/uL   Basophils Absolute 0 0 - 200 cells/uL   Neutrophils Relative % 68 %   Lymphocytes Relative 20 %   Monocytes Relative 10 %   Eosinophils Relative 2 %   Basophils Relative 0 %   Smear Review Criteria for review not met   Lipid panel  Result Value Ref Range   Cholesterol 109 <200 mg/dL   Triglycerides 117 <150 mg/dL   HDL 44 (L) >50 mg/dL   Total CHOL/HDL Ratio 2.5 <5.0 Ratio   VLDL 23 <30 mg/dL   LDL Cholesterol 42 <100 mg/dL  COMPLETE METABOLIC PANEL WITH GFR  Result Value Ref Range   Sodium 142 135 - 146 mmol/L   Potassium 3.7 3.5 - 5.3 mmol/L   Chloride 103 98 - 110 mmol/L   CO2 30 20 - 31 mmol/L   Glucose, Bld 98 65 - 99 mg/dL   BUN 13 7 - 25 mg/dL   Creat 0.88 0.50 - 1.05 mg/dL   Total Bilirubin 0.4 0.2 - 1.2 mg/dL   Alkaline Phosphatase 71 33 - 130 U/L   AST 20 10 - 35 U/L   ALT 27 6 -  29 U/L   Total Protein 6.7 6.1 - 8.1 g/dL   Albumin 4.2 3.6 - 5.1  g/dL   Calcium 9.4 8.6 - 10.4 mg/dL   GFR, Est African American 87 >=60 mL/min   GFR, Est Non African American 75 >=60 mL/min      Assessment & Plan:   Problem List Items Addressed This Visit      Cardiovascular and Mediastinum   Hypertension (Chronic)    Controlled; continue regimen      CAD (coronary artery disease) (Chronic)    On appropriate meds, beta-blocker, aspirin, plavix, statin; f/u with cardiologist        Other   Tobacco use    Encouraged cessation      Obesity (BMI 30.0-34.9)    Encouraged her to work on weight loss; education material provided      Medication monitoring encounter    Monitor labs on meds      Relevant Orders   CBC with Differential/Platelet   COMPLETE METABOLIC PANEL WITH GFR   Hyperlipidemia    Return for fasting lipids; limit saturated fats; continue statin      Relevant Orders   Lipid panel    Other Visit Diagnoses    RLQ abdominal pain    -  Primary   Relevant Orders   US Transvaginal Non-OB   US Pelvis Complete       Follow up plan: Return in about 7 months (around 02/07/2018) for follow-up visit with Dr. Sanda Klein.  An after-visit summary was printed and given to the patient at [place].  Please see the patient instructions which may contain other information and recommendations beyond what is mentioned above in the assessment and plan.  No orders of the defined types were placed in this encounter.   Orders Placed This Encounter  Procedures  . US Transvaginal Non-OB  . US Pelvis Complete  . CBC with Differential/Platelet  . COMPLETE METABOLIC PANEL WITH GFR  . Lipid panel

## 2017-07-30 ENCOUNTER — Encounter: Payer: Self-pay | Admitting: Family Medicine

## 2017-07-30 DIAGNOSIS — Z6831 Body mass index (BMI) 31.0-31.9, adult: Secondary | ICD-10-CM | POA: Insufficient documentation

## 2017-07-30 DIAGNOSIS — E669 Obesity, unspecified: Secondary | ICD-10-CM | POA: Insufficient documentation

## 2017-07-30 NOTE — Assessment & Plan Note (Signed)
Encouraged cessation.

## 2017-07-30 NOTE — Assessment & Plan Note (Signed)
On appropriate meds, beta-blocker, aspirin, plavix, statin; f/u with cardiologist

## 2017-07-30 NOTE — Assessment & Plan Note (Signed)
Encouraged her to work on weight loss; Air traffic controller provided

## 2017-07-30 NOTE — Assessment & Plan Note (Signed)
Controlled; continue regimen 

## 2017-07-30 NOTE — Assessment & Plan Note (Signed)
Return for fasting lipids; limit saturated fats; continue statin

## 2017-07-30 NOTE — Assessment & Plan Note (Signed)
Monitor labs on meds

## 2017-08-01 DIAGNOSIS — R079 Chest pain, unspecified: Secondary | ICD-10-CM | POA: Diagnosis not present

## 2017-08-01 DIAGNOSIS — R0602 Shortness of breath: Secondary | ICD-10-CM | POA: Diagnosis not present

## 2017-08-01 DIAGNOSIS — I2510 Atherosclerotic heart disease of native coronary artery without angina pectoris: Secondary | ICD-10-CM | POA: Diagnosis not present

## 2017-08-01 DIAGNOSIS — I10 Essential (primary) hypertension: Secondary | ICD-10-CM | POA: Diagnosis not present

## 2017-08-02 DIAGNOSIS — R079 Chest pain, unspecified: Secondary | ICD-10-CM | POA: Diagnosis not present

## 2017-08-04 DIAGNOSIS — I10 Essential (primary) hypertension: Secondary | ICD-10-CM | POA: Diagnosis not present

## 2017-08-04 DIAGNOSIS — I2510 Atherosclerotic heart disease of native coronary artery without angina pectoris: Secondary | ICD-10-CM | POA: Diagnosis not present

## 2017-08-04 DIAGNOSIS — R079 Chest pain, unspecified: Secondary | ICD-10-CM | POA: Diagnosis not present

## 2017-08-04 DIAGNOSIS — E782 Mixed hyperlipidemia: Secondary | ICD-10-CM | POA: Diagnosis not present

## 2017-08-08 DIAGNOSIS — G8929 Other chronic pain: Secondary | ICD-10-CM | POA: Diagnosis not present

## 2017-08-08 DIAGNOSIS — M25571 Pain in right ankle and joints of right foot: Secondary | ICD-10-CM | POA: Diagnosis not present

## 2017-08-10 ENCOUNTER — Other Ambulatory Visit: Payer: Self-pay | Admitting: Family Medicine

## 2017-08-11 NOTE — Telephone Encounter (Signed)
Patient needs her labs done and then we'll be happy to refill her medicines for longer

## 2017-08-11 NOTE — Telephone Encounter (Signed)
Called pt no answer. LM for pt informing her of the need to come in for labs for further refills.

## 2017-08-14 ENCOUNTER — Other Ambulatory Visit: Payer: Self-pay

## 2017-08-14 DIAGNOSIS — Z5181 Encounter for therapeutic drug level monitoring: Secondary | ICD-10-CM | POA: Diagnosis not present

## 2017-08-14 DIAGNOSIS — E782 Mixed hyperlipidemia: Secondary | ICD-10-CM

## 2017-08-14 LAB — COMPLETE METABOLIC PANEL WITH GFR
AG Ratio: 1.9 (calc) (ref 1.0–2.5)
ALBUMIN MSPROF: 4.4 g/dL (ref 3.6–5.1)
ALKALINE PHOSPHATASE (APISO): 74 U/L (ref 33–130)
ALT: 20 U/L (ref 6–29)
AST: 20 U/L (ref 10–35)
BUN: 17 mg/dL (ref 7–25)
CALCIUM: 9.5 mg/dL (ref 8.6–10.4)
CO2: 32 mmol/L (ref 20–32)
CREATININE: 0.94 mg/dL (ref 0.50–1.05)
Chloride: 104 mmol/L (ref 98–110)
GFR, EST AFRICAN AMERICAN: 80 mL/min/{1.73_m2} (ref >=60)
GFR, EST NON AFRICAN AMERICAN: 69 mL/min/{1.73_m2} (ref >=60)
GLOBULIN: 2.3 g/dL (ref 1.9–3.7)
Glucose, Bld: 92 mg/dL (ref 65–99)
Potassium: 3.6 mmol/L (ref 3.5–5.3)
SODIUM: 141 mmol/L (ref 135–146)
TOTAL PROTEIN: 6.7 g/dL (ref 6.1–8.1)
Total Bilirubin: 0.5 mg/dL (ref 0.2–1.2)

## 2017-08-14 LAB — LIPID PANEL
CHOLESTEROL: 104 mg/dL (ref <200)
HDL: 50 mg/dL — ABNORMAL LOW (ref >50)
LDL CHOLESTEROL (CALC): 34 mg/dL
Non-HDL Cholesterol (Calc): 54 mg/dL (calc) (ref <130)
Total CHOL/HDL Ratio: 2.1 (calc) (ref <5.0)
Triglycerides: 121 mg/dL (ref <150)

## 2017-08-14 LAB — CBC WITH DIFFERENTIAL/PLATELET
BASOS ABS: 42 {cells}/uL (ref 0–200)
Basophils Relative: 0.6 %
EOS ABS: 161 {cells}/uL (ref 15–500)
Eosinophils Relative: 2.3 %
HEMATOCRIT: 41.9 % (ref 35.0–45.0)
Hemoglobin: 14.5 g/dL (ref 11.7–15.5)
Lymphs Abs: 1533 cells/uL (ref 850–3900)
MCH: 31.9 pg (ref 27.0–33.0)
MCHC: 34.6 g/dL (ref 32.0–36.0)
MCV: 92.1 fL (ref 80.0–100.0)
MPV: 10 fL (ref 7.5–12.5)
Monocytes Relative: 8.9 %
NEUTROS PCT: 66.3 %
Neutro Abs: 4641 cells/uL (ref 1500–7800)
PLATELETS: 194 10*3/uL (ref 140–400)
RBC: 4.55 10*6/uL (ref 3.80–5.10)
RDW: 12.3 % (ref 11.0–15.0)
Total Lymphocyte: 21.9 %
WBC: 7 10*3/uL (ref 3.8–10.8)
WBCMIX: 623 {cells}/uL (ref 200–950)

## 2017-08-25 ENCOUNTER — Ambulatory Visit: Payer: BLUE CROSS/BLUE SHIELD

## 2017-08-29 ENCOUNTER — Encounter: Payer: Self-pay | Admitting: Family Medicine

## 2017-09-01 ENCOUNTER — Ambulatory Visit
Admission: RE | Admit: 2017-09-01 | Discharge: 2017-09-01 | Disposition: A | Discharge status: Home / Self Care | Payer: BLUE CROSS/BLUE SHIELD | Source: Ambulatory Visit | Attending: Family Medicine | Admitting: Family Medicine

## 2017-09-01 DIAGNOSIS — R1031 Right lower quadrant pain: Secondary | ICD-10-CM | POA: Diagnosis not present

## 2017-09-01 DIAGNOSIS — R102 Pelvic and perineal pain: Secondary | ICD-10-CM | POA: Diagnosis not present

## 2017-09-04 ENCOUNTER — Telehealth: Payer: Self-pay

## 2017-09-04 NOTE — Telephone Encounter (Signed)
Copied from [place] [id number]. Topic: Quick Communication - Office Called Patient >> Sep 04, 2017 11:02 AM Cathrine Muster, CMA wrote: Reason for CRM: Pt dropped off form for signature of proof of physical, I called left pt voicemail looking at her chart I do not see she had a physical this past year? >> Sep 04, 2017 11:46 AM Scherrie Gerlach wrote: Pt states she showed the paper to the dr when she was there 4/15 and dr Sanda Klein states she could not fill out until she had labs. So pt states she had lab work and thought that would suffice, per conversation with the dr

## 2017-09-04 NOTE — Telephone Encounter (Signed)
No, just having labs does not qualify as a "physical" Please schedule her for a physical if due

## 2017-09-04 NOTE — Telephone Encounter (Signed)
Pt notified, will send her bloodwork.

## 2017-11-14 ENCOUNTER — Other Ambulatory Visit: Payer: Self-pay | Admitting: Family Medicine

## 2017-11-14 NOTE — Telephone Encounter (Signed)
Reviewed sgpt and lipids; Rx approved

## 2018-01-02 ENCOUNTER — Other Ambulatory Visit: Payer: Self-pay | Admitting: Family Medicine

## 2018-01-02 NOTE — Telephone Encounter (Signed)
Last OV: 07/24/17 Next: 02/23/18

## 2018-01-04 ENCOUNTER — Telehealth: Payer: Self-pay | Admitting: Family Medicine

## 2018-01-04 NOTE — Telephone Encounter (Signed)
Copied from [place] [id number]. Topic: Quick Communication - Rx Refill/Question >> Jan 04, 2018  1:06 PM Maria Franco wrote: Medication: amLODipine (NORVASC) 5 MG tablet [[id number]]   Has the patient contacted their pharmacy? Yes.   (Agent: If no, request that the patient contact the pharmacy for the refill.) (Agent: If yes, when and what did the pharmacy advise?)  Preferred Pharmacy (with phone number or street name): [facility] in [place], [place]  Agent: Please be advised that RX refills may take up to 3 business days. We ask that you follow-up with your pharmacy.

## 2018-01-05 NOTE — Telephone Encounter (Signed)
Called in verbally

## 2018-01-19 ENCOUNTER — Other Ambulatory Visit: Payer: Self-pay | Admitting: Nurse Practitioner

## 2018-01-19 NOTE — Telephone Encounter (Signed)
Pt coming in on Oct 11th

## 2018-01-19 NOTE — Telephone Encounter (Signed)
She needs an appointment - please schedule for follow up appointment with Dr. Sanda Klein or Benjamine Mola NP-C.  I will defer to Dr. Sanda Klein to decide if refills are appropriate.

## 2018-01-20 MED ORDER — LOSARTAN POTASSIUM-HCTZ 100-25 MG PO TABS: 1.0000 | ORAL_TABLET | Freq: Every day | ORAL | 0 refills | Status: DC

## 2018-01-20 MED ORDER — VENLAFAXINE HCL ER 75 MG PO CP24: 75.0000 mg | ORAL_CAPSULE | Freq: Every day | ORAL | 0 refills | Status: DC

## 2018-01-20 MED ORDER — METOPROLOL SUCCINATE ER 50 MG PO TB24: 50.0000 mg | ORAL_TABLET | Freq: Every day | ORAL | 0 refills | Status: DC

## 2018-01-20 NOTE — Telephone Encounter (Signed)
She did not need amlo I refilled the others

## 2018-02-09 ENCOUNTER — Ambulatory Visit: Payer: BLUE CROSS/BLUE SHIELD | Admitting: Family Medicine

## 2018-02-09 ENCOUNTER — Encounter: Payer: Self-pay | Admitting: Family Medicine

## 2018-02-09 VITALS — BP 128/64 | HR 89 | Temp 98.3°F | Ht 65.0 in | Wt 186.2 lb

## 2018-02-09 DIAGNOSIS — I2510 Atherosclerotic heart disease of native coronary artery without angina pectoris: Secondary | ICD-10-CM

## 2018-02-09 DIAGNOSIS — I10 Essential (primary) hypertension: Secondary | ICD-10-CM

## 2018-02-09 DIAGNOSIS — Z720 Tobacco use: Secondary | ICD-10-CM

## 2018-02-09 DIAGNOSIS — Z5181 Encounter for therapeutic drug level monitoring: Secondary | ICD-10-CM

## 2018-02-09 DIAGNOSIS — Z1239 Encounter for other screening for malignant neoplasm of breast: Secondary | ICD-10-CM

## 2018-02-09 DIAGNOSIS — E669 Obesity, unspecified: Secondary | ICD-10-CM | POA: Diagnosis not present

## 2018-02-09 DIAGNOSIS — E782 Mixed hyperlipidemia: Secondary | ICD-10-CM

## 2018-02-09 DIAGNOSIS — Z1211 Encounter for screening for malignant neoplasm of colon: Secondary | ICD-10-CM

## 2018-02-09 DIAGNOSIS — I2583 Coronary atherosclerosis due to lipid rich plaque: Secondary | ICD-10-CM

## 2018-02-09 NOTE — Patient Instructions (Addendum)
Try to follow the DASH guidelines (DASH stands for Dietary Approaches to Stop Hypertension). Try to limit the sodium in your diet to no more than 1,500mg  of sodium per day. Certainly try to not exceed 2,000 mg per day at the very most. Do not add salt when cooking or at the table.  Check the sodium amount on labels when shopping, and choose items lower in sodium when given a choice. Avoid or limit foods that already contain a lot of sodium. Eat a diet rich in fruits and vegetables and whole grains, and try to lose weight if overweight or obese  Try to limit saturated fats in your diet (bologna, hot dogs, barbeque, cheeseburgers, hamburgers, steak, bacon, sausage, cheese, etc.) and get more fresh fruits, vegetables, and whole grains  Check out the information at familydoctor.org entitled "Nutrition for Weight Loss: What You Need to Know about Fad Diets" Try to lose between 1-2 pounds per week by taking in fewer calories and burning off more calories You can succeed by limiting portions, limiting foods dense in calories and fat, becoming more active, and drinking 8 glasses of water a day (64 ounces) Don't skip meals, especially breakfast, as skipping meals may alter your metabolism Do not use over-the-counter weight loss pills or gimmicks that claim rapid weight loss A healthy BMI (or body mass index) is between 18.5 and 24.9 You can calculate your ideal BMI at the [place] website [employer]Mon[employer].fr  Obesity, Adult Obesity is the condition of having too much total body fat. Being overweight or obese means that your weight is greater than what is considered healthy for your body size. Obesity is determined by a measurement called BMI. BMI is an estimate of body fat and is calculated from height and weight. For adults, a BMI of 30 or higher is considered obese. Obesity can eventually lead to other health concerns and major illnesses,  including:  Stroke.  Coronary artery disease (CAD).  Type 2 diabetes.  Some types of cancer, including cancers of the colon, breast, uterus, and gallbladder.  Osteoarthritis.  High blood pressure (hypertension).  High cholesterol.  Sleep apnea.  Gallbladder stones.  Infertility problems.  What are the causes? The main cause of obesity is taking in (consuming) more calories than your body uses for energy. Other factors that contribute to this condition may include:  Being born with genes that make you more likely to become obese.  Having a medical condition that causes obesity. These conditions include: ? Hypothyroidism. ? Polycystic ovarian syndrome (PCOS). ? Binge-eating disorder. ? Cushing syndrome.  Taking certain medicines, such as steroids, antidepressants, and seizure medicines.  Not being physically active (sedentary lifestyle).  Living where there are limited places to exercise safely or buy healthy foods.  Not getting enough sleep.  What increases the risk? The following factors may increase your risk of this condition:  Having a family history of obesity.  Being a woman of African-American descent.  Being a man of Hispanic descent.  What are the signs or symptoms? Having excessive body fat is the main symptom of this condition. How is this diagnosed? This condition may be diagnosed based on:  Your symptoms.  Your medical history.  A physical exam. Your health care provider may measure: ? Your BMI. If you are an adult with a BMI between 25 and less than 30, you are considered overweight. If you are an adult with a BMI of 30 or higher, you are considered obese. ? The distances around your hips and  your waist (circumferences). These may be compared to each other to help diagnose your condition. ? Your skinfold thickness. Your health care provider may gently pinch a fold of your skin and measure it.  How is this treated? Treatment for this  condition often includes changing your lifestyle. Treatment may include some or all of the following:  Dietary changes. Work with your health care provider and a dietitian to set a weight-loss goal that is healthy and reasonable for you. Dietary changes may include eating: ? Smaller portions. A portion size is the amount of a particular food that is healthy for you to eat at one time. This varies from person to person. ? Low-calorie or low-fat options. ? More whole grains, fruits, and vegetables.  Regular physical activity. This may include aerobic activity (cardio) and strength training.  Medicine to help you lose weight. Your health care provider may prescribe medicine if you are unable to lose 1 pound a week after 6 weeks of eating more healthily and doing more physical activity.  Surgery. Surgical options may include gastric banding and gastric bypass. Surgery may be done if: ? Other treatments have not helped to improve your condition. ? You have a BMI of 40 or higher. ? You have life-threatening health problems related to obesity.  Follow these instructions at home:  Eating and drinking   Follow recommendations from your health care provider about what you eat and drink. Your health care provider may advise you to: ? Limit fast foods, sweets, and processed snack foods. ? Choose low-fat options, such as low-fat milk instead of whole milk. ? Eat 5 or more servings of fruits or vegetables every day. ? Eat at home more often. This gives you more control over what you eat. ? Choose healthy foods when you eat out. ? Learn what a healthy portion size is. ? Keep low-fat snacks on hand. ? Avoid sugary drinks, such as soda, fruit juice, iced tea sweetened with sugar, and flavored milk. ? Eat a healthy breakfast.  Drink enough water to keep your urine clear or pale yellow.  Do not go without eating for long periods of time (do not fast) or follow a fad diet. Fasting and fad diets can be  unhealthy and even dangerous. Physical Activity  Exercise regularly, as told by your health care provider. Ask your health care provider what types of exercise are safe for you and how often you should exercise.  Warm up and stretch before being active.  Cool down and stretch after being active.  Rest between periods of activity. Lifestyle  Limit the time that you spend in front of your TV, computer, or video game system.  Find ways to reward yourself that do not involve food.  Limit alcohol intake to no more than 1 drink a day for nonpregnant women and 2 drinks a day for men. One drink equals 12 oz of beer, 5 oz of wine, or 1 oz of hard liquor. General instructions  Keep a weight loss journal to keep track of the food you eat and how much you exercise you get.  Take over-the-counter and prescription medicines only as told by your health care provider.  Take vitamins and supplements only as told by your health care provider.  Consider joining a support group. Your health care provider may be able to recommend a support group.  Keep all follow-up visits as told by your health care provider. This is important. Contact a health care provider if:  You  are unable to meet your weight loss goal after 6 weeks of dietary and lifestyle changes. This information is not intended to replace advice given to you by your health care provider. Make sure you discuss any questions you have with your health care provider. Document Released: 05/26/2004 Document Revised: 09/21/2015 Document Reviewed: 02/04/2015 Elsevier Interactive Patient Education  2018 [employer].

## 2018-02-09 NOTE — Progress Notes (Signed)
BP 128/64   Pulse 89   Temp 98.3 F (36.8 C) (Oral)   Ht 5\' 5"  (1.651 m)   Wt 186 lb 3.2 oz (84.5 kg)   SpO2 96%   BMI 30.99 kg/m    Subjective:    Patient ID: Maria Franco, female    DOB: 12-16-1960, 57 y.o.   MRN: [id number]  HPI: Maria Franco is a 57 y.o. female  Chief Complaint  Patient presents with  . Follow-up  . Medication Refill    HPI  She is here for f/u CAD, on statin for stent; plavix was stopped intentionally by cardiologist, and now on [place]; twice a day and she has an alarm to remind her HTN; taking medicine daily; occasionally uses added salt; adds salt when cooking Rubbed the inside of her eyes on blood thinner and no abuse Breakfast is usually frozen pita from [employer], [place] sausage and egg whites and cheese, 4 weight watchers points Obesity; just started back on Weight Watchers; going down to 160 over the next year She continues to smoke  Depression screen [facility] 2/9 02/09/2018 07/24/2017 01/24/2017 12/15/2016 10/30/2015  Decreased Interest 0 0 0 0 0  Down, Depressed, Hopeless 0 0 0 1 1  PHQ - 2 Score 0 0 0 1 1  Altered sleeping 0 - - - -  Tired, decreased energy 0 - - - -  Change in appetite 0 - - - -  Feeling bad or failure about yourself  0 - - - -  Trouble concentrating 0 - - - -  Moving slowly or fidgety/restless 0 - - - -  Suicidal thoughts 0 - - - -  PHQ-9 Score 0 - - - -  Difficult doing work/chores Not difficult at all - - - -   Fall Risk  02/09/2018 07/24/2017 01/24/2017 12/15/2016 10/30/2015  Falls in the past year? No No No No No    Relevant past medical, surgical, family and social history reviewed Past Medical History:  Diagnosis Date  . Adrenal mass, left ([place])    followed by Endocrine, hormone testing q 5 years and CT q 1-2 years  . CAD (coronary artery disease)   . Depression   . Hyperlipidemia   . Hypertension   . MVP (mitral valve prolapse)   . Obesity   . Panic attacks   . Plantar fascia syndrome   . Presence of stent in  LAD coronary artery   . 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 Tricuspid valve regurgitation    Past Surgical History:  Procedure Laterality Date  . CORONARY ANGIOPLASTY WITH STENT PLACEMENT  2006  . ENDOMETRIAL ABLATION  2012  . HERNIA REPAIR     at 7 yrs of age  . TUBAL LIGATION  2012   Family History  Adopted: Yes  Family history unknown: Yes   Social History   Tobacco Use  . Smoking status: Current Some Day Smoker    Types: Cigarettes, E-cigarettes  . Smokeless tobacco: Current User  . Tobacco comment: vaper  Substance Use Topics  . Alcohol use: Yes    Alcohol/week: 0.0 standard drinks    Comment: Occasionally  . Drug use: No     [facility] Visit from 02/09/2018 in [facility]  AUDIT-C Score  3      Interim medical history since last visit reviewed. Allergies and medications reviewed  Review of Systems Per HPI unless specifically indicated above  Objective:    BP 128/64   Pulse 89   Temp 98.3 F (36.8 C) (Oral)   Ht 5\' 5"  (1.651 m)   Wt 186 lb 3.2 oz (84.5 kg)   SpO2 96%   BMI 30.99 kg/m   Wt Readings from Last 3 Encounters:  02/09/18 186 lb 3.2 oz (84.5 kg)  07/24/17 183 lb 6.4 oz (83.2 kg)  01/24/17 180 lb 3.2 oz (81.7 kg)    Physical Exam  Constitutional: She appears well-developed and well-nourished. No distress.  HENT:  Head: Normocephalic and atraumatic.  Eyes: EOM are normal. No scleral icterus.  Neck: No thyromegaly present.  Cardiovascular: Normal rate, regular rhythm and normal heart sounds.  No murmur heard. Pulmonary/Chest: Effort normal and breath sounds normal. No respiratory distress. She has no wheezes.  Abdominal: Soft. Bowel sounds are normal. She exhibits no distension.  Musculoskeletal: She exhibits no edema.  Neurological: She is alert.  Skin: Skin is warm and dry. Bruising (small bruise near inside corner of LEFT eye) noted. She is not diaphoretic. No pallor.  Psychiatric:  She has a normal mood and affect. Her behavior is normal. Judgment and thought content normal.    Results for orders placed or performed in visit on 08/14/17  Lipid panel  Result Value Ref Range   Cholesterol 104 <200 mg/dL   HDL 50 (L) >50 mg/dL   Triglycerides 121 <150 mg/dL   LDL Cholesterol (Calc) 34 mg/dL (calc)   Total CHOL/HDL Ratio 2.1 <5.0 (calc)   Non-HDL Cholesterol (Calc) 54 <130 mg/dL (calc)  COMPLETE METABOLIC PANEL WITH GFR  Result Value Ref Range   Glucose, Bld 92 65 - 99 mg/dL   BUN 17 7 - 25 mg/dL   Creat 0.94 0.50 - 1.05 mg/dL   GFR, Est Non African American 69 > OR = 60 mL/min/1.66m2   GFR, Est African American 80 > OR = 60 mL/min/1.44m2   BUN/Creatinine Ratio NOT APPLICABLE 6 - 22 (calc)   Sodium 141 135 - 146 mmol/L   Potassium 3.6 3.5 - 5.3 mmol/L   Chloride 104 98 - 110 mmol/L   CO2 32 20 - 32 mmol/L   Calcium 9.5 8.6 - 10.4 mg/dL   Total Protein 6.7 6.1 - 8.1 g/dL   Albumin 4.4 3.6 - 5.1 g/dL   Globulin 2.3 1.9 - 3.7 g/dL (calc)   AG Ratio 1.9 1.0 - 2.5 (calc)   Total Bilirubin 0.5 0.2 - 1.2 mg/dL   Alkaline phosphatase (APISO) 74 33 - 130 U/L   AST 20 10 - 35 U/L   ALT 20 6 - 29 U/L  CBC with Differential/Platelet  Result Value Ref Range   WBC 7.0 3.8 - 10.8 Thousand/uL   RBC 4.55 3.80 - 5.10 Million/uL   Hemoglobin 14.5 11.7 - 15.5 g/dL   HCT 41.9 35.0 - 45.0 %   MCV 92.1 80.0 - 100.0 fL   MCH 31.9 27.0 - 33.0 pg   MCHC 34.6 32.0 - 36.0 g/dL   RDW 12.3 11.0 - 15.0 %   Platelets 194 140 - 400 Thousand/uL   MPV 10.0 7.5 - 12.5 fL   Neutro Abs 4,641 1,500 - 7,800 cells/uL   Lymphs Abs 1,533 850 - 3,900 cells/uL   WBC mixed population 623 200 - 950 cells/uL   Eosinophils Absolute 161 15 - 500 cells/uL   Basophils Absolute 42 0 - 200 cells/uL   Neutrophils Relative % 66.3 %   Total Lymphocyte 21.9 %   Monocytes Relative 8.9 %  Eosinophils Relative 2.3 %   Basophils Relative 0.6 %      Assessment & Plan:   Problem List Items Addressed  This Visit      Cardiovascular and Mediastinum   Hypertension - Primary (Chronic)    Controlled today; weight management and DASH guidelines helpful      CAD (coronary artery disease) (Chronic)    Managed by cardiologist; continue antiplatelet therapy, statin        Other   Medication monitoring encounter   Relevant Orders   COMPLETE METABOLIC PANEL WITH GFR   Tobacco use    Patient unfortunately continues to smoke      Obesity (BMI 30.0-34.9)    Weight Watchers; encouragement given      Hyperlipidemia    Goal LDL less than 70; continue statin      Relevant Orders   Lipid panel    Other Visit Diagnoses    Screening for breast cancer       Relevant Orders   MM 3D SCREEN BREAST BILATERAL   Screen for colon cancer       Relevant Orders   Cologuard       Follow up plan: Return in about 4 weeks (around 03/09/2018) for CPE with pap and fasting labs.  An after-visit summary was printed and given to the patient at [place].  Please see the patient instructions which may contain other information and recommendations beyond what is mentioned above in the assessment and plan.  No orders of the defined types were placed in this encounter.   Orders Placed This Encounter  Procedures  . MM 3D SCREEN BREAST BILATERAL  . Cologuard  . COMPLETE METABOLIC PANEL WITH GFR  . Lipid panel

## 2018-02-13 NOTE — Assessment & Plan Note (Signed)
Patient unfortunately continues to smoke 

## 2018-02-13 NOTE — Assessment & Plan Note (Signed)
Controlled today; weight management and DASH guidelines helpful

## 2018-02-13 NOTE — Assessment & Plan Note (Signed)
Goal LDL less than 70; continue statin 

## 2018-02-13 NOTE — Assessment & Plan Note (Signed)
Managed by cardiologist; continue antiplatelet therapy, statin

## 2018-02-13 NOTE — Assessment & Plan Note (Signed)
Weight Watchers; encouragement given

## 2018-02-23 ENCOUNTER — Ambulatory Visit: Payer: BLUE CROSS/BLUE SHIELD | Admitting: Family Medicine

## 2018-03-13 ENCOUNTER — Encounter: Payer: BLUE CROSS/BLUE SHIELD | Admitting: Family Medicine

## 2018-03-25 ENCOUNTER — Other Ambulatory Visit: Payer: Self-pay | Admitting: Nurse Practitioner

## 2018-03-26 ENCOUNTER — Other Ambulatory Visit (HOSPITAL_COMMUNITY)
Admission: RE | Admit: 2018-03-26 | Discharge: 2018-03-26 | Disposition: A | Discharge status: Home / Self Care | Payer: BLUE CROSS/BLUE SHIELD | Source: Ambulatory Visit | Attending: Family Medicine | Admitting: Family Medicine

## 2018-03-26 ENCOUNTER — Encounter: Payer: Self-pay | Admitting: Family Medicine

## 2018-03-26 ENCOUNTER — Ambulatory Visit (INDEPENDENT_AMBULATORY_CARE_PROVIDER_SITE_OTHER): Payer: BLUE CROSS/BLUE SHIELD | Admitting: Family Medicine

## 2018-03-26 VITALS — BP 126/78 | HR 74 | Temp 98.7°F | Ht 65.0 in | Wt 183.7 lb

## 2018-03-26 DIAGNOSIS — Z124 Encounter for screening for malignant neoplasm of cervix: Secondary | ICD-10-CM | POA: Diagnosis not present

## 2018-03-26 DIAGNOSIS — Z0000 Encounter for general adult medical examination without abnormal findings: Secondary | ICD-10-CM

## 2018-03-26 DIAGNOSIS — Z720 Tobacco use: Secondary | ICD-10-CM | POA: Diagnosis not present

## 2018-03-26 DIAGNOSIS — Z1211 Encounter for screening for malignant neoplasm of colon: Secondary | ICD-10-CM | POA: Insufficient documentation

## 2018-03-26 DIAGNOSIS — E669 Obesity, unspecified: Secondary | ICD-10-CM | POA: Diagnosis not present

## 2018-03-26 MED ORDER — AMLODIPINE BESYLATE 5 MG PO TABS: 5.0000 mg | ORAL_TABLET | Freq: Every day | ORAL | 3 refills | Status: DC

## 2018-03-26 NOTE — Assessment & Plan Note (Signed)
Advised low dose chest CT for colon cancer screening; she declined; wants to wait until next year

## 2018-03-26 NOTE — Patient Instructions (Addendum)
Check out the information at familydoctor.org entitled "Nutrition for Weight Loss: What You Need to Know about Fad Diets" Try to lose between 1-2 pounds per week by taking in fewer calories and burning off more calories You can succeed by limiting portions, limiting foods dense in calories and fat, becoming more active, and drinking 8 glasses of water a day (64 ounces) Don't skip meals, especially breakfast, as skipping meals may alter your metabolism Do not use over-the-counter weight loss pills or gimmicks that claim rapid weight loss A healthy BMI (or body mass index) is between 18.5 and 24.9 You can calculate your ideal BMI at the [place] website ClubMon[employer]ize.fr  Please do follow-up with Cologuard  Consider getting the new shingles vaccine called Shingrix; that is available for individuals 55 years of age and older, and is recommended even if you have had shingles in the past and/or already received the old shingles vaccine (Zostavax); it is a two-part series, and is available at many local pharmacies or here  I do encourage you to quit smoking Call [phone] to sign up for smoking cessation classes You can call 1-800-QUIT-NOW to talk with a smoking cessation coach  Health Maintenance, Female Adopting a healthy lifestyle and getting preventive care can go a long way to promote health and wellness. Talk with your health care provider about what schedule of regular examinations is right for you. This is a good chance for you to check in with your provider about disease prevention and staying healthy. In between checkups, there are plenty of things you can do on your own. Experts have done a lot of research about which lifestyle changes and preventive measures are most likely to keep you healthy. Ask your health care provider for more information. Weight and diet Eat a healthy diet  Be sure to include plenty of vegetables, fruits, low-fat  dairy products, and lean protein.  Do not eat a lot of foods high in solid fats, added sugars, or salt.  Get regular exercise. This is one of the most important things you can do for your health. ? Most adults should exercise for at least 150 minutes each week. The exercise should increase your heart rate and make you sweat (moderate-intensity exercise). ? Most adults should also do strengthening exercises at least twice a week. This is in addition to the moderate-intensity exercise.  Maintain a healthy weight  Body mass index (BMI) is a measurement that can be used to identify possible weight problems. It estimates body fat based on height and weight. Your health care provider can help determine your BMI and help you achieve or maintain a healthy weight.  For females 55 years of age and older: ? A BMI below 18.5 is considered underweight. ? A BMI of 18.5 to 24.9 is normal. ? A BMI of 25 to 29.9 is considered overweight. ? A BMI of 30 and above is considered obese.  Watch levels of cholesterol and blood lipids  You should start having your blood tested for lipids and cholesterol at 55 years of age, then have this test every 5 years.  You may need to have your cholesterol levels checked more often if: ? Your lipid or cholesterol levels are high. ? You are older than 55 years of age. ? You are at high risk for heart disease.  Cancer screening Lung Cancer  Lung cancer screening is recommended for adults 55-62 years old who are at high risk for lung cancer because of a history of smoking.  A yearly low-dose CT scan of the lungs is recommended for people who: ? Currently smoke. ? Have quit within the past 15 years. ? Have at least a 30-pack-year history of smoking. A pack year is smoking an average of one pack of cigarettes a day for 1 year.  Yearly screening should continue until it has been 15 years since you quit.  Yearly screening should stop if you develop a health problem that  would prevent you from having lung cancer treatment.  Breast Cancer  Practice breast self-awareness. This means understanding how your breasts normally appear and feel.  It also means doing regular breast self-exams. Let your health care provider know about any changes, no matter how small.  If you are in your 55s or 30s, you should have a clinical breast exam (CBE) by a health care provider every 1-3 years as part of a regular health exam.  If you are 55 or older, have a CBE every year. Also consider having a breast X-ray (mammogram) every year.  If you have a family history of breast cancer, talk to your health care provider about genetic screening.  If you are at high risk for breast cancer, talk to your health care provider about having an MRI and a mammogram every year.  Breast cancer gene (BRCA) assessment is recommended for women who have family members with BRCA-related cancers. BRCA-related cancers include: ? Breast. ? Ovarian. ? Tubal. ? Peritoneal cancers.  Results of the assessment will determine the need for genetic counseling and BRCA1 and BRCA2 testing.  Cervical Cancer Your health care provider may recommend that you be screened regularly for cancer of the pelvic organs (ovaries, uterus, and vagina). This screening involves a pelvic examination, including checking for microscopic changes to the surface of your cervix (Pap test). You may be encouraged to have this screening done every 3 years, beginning at age 55.  For women ages 55-65, health care providers may recommend pelvic exams and Pap testing every 3 years, or they may recommend the Pap and pelvic exam, combined with testing for human papilloma virus (HPV), every 5 years. Some types of HPV increase your risk of cervical cancer. Testing for HPV may also be done on women of any age with unclear Pap test results.  Other health care providers may not recommend any screening for nonpregnant women who are considered low  risk for pelvic cancer and who do not have symptoms. Ask your health care provider if a screening pelvic exam is right for you.  If you have had past treatment for cervical cancer or a condition that could lead to cancer, you need Pap tests and screening for cancer for at least 20 years after your treatment. If Pap tests have been discontinued, your risk factors (such as having a new sexual partner) need to be reassessed to determine if screening should resume. Some women have medical problems that increase the chance of getting cervical cancer. In these cases, your health care provider may recommend more frequent screening and Pap tests.  Colorectal Cancer  This type of cancer can be detected and often prevented.  Routine colorectal cancer screening usually begins at 55 years of age and continues through 55 years of age.  Your health care provider may recommend screening at an earlier age if you have risk factors for colon cancer.  Your health care provider may also recommend using home test kits to check for hidden blood in the stool.  A small camera at the end  of a tube can be used to examine your colon directly (sigmoidoscopy or colonoscopy). This is done to check for the earliest forms of colorectal cancer.  Routine screening usually begins at age 60.  Direct examination of the colon should be repeated every 5-10 years through 55 years of age. However, you may need to be screened more often if early forms of precancerous polyps or small growths are found.  Skin Cancer  Check your skin from head to toe regularly.  Tell your health care provider about any new moles or changes in moles, especially if there is a change in a mole's shape or color.  Also tell your health care provider if you have a mole that is larger than the size of a pencil eraser.  Always use sunscreen. Apply sunscreen liberally and repeatedly throughout the day.  Protect yourself by wearing long sleeves, pants, a  wide-brimmed hat, and sunglasses whenever you are outside.  Heart disease, diabetes, and high blood pressure  High blood pressure causes heart disease and increases the risk of stroke. High blood pressure is more likely to develop in: ? People who have blood pressure in the high end of the normal range (130-139/85-89 mm Hg). ? People who are overweight or obese. ? People who are African American.  If you are 33-66 years of age, have your blood pressure checked every 3-5 years. If you are 39 years of age or older, have your blood pressure checked every year. You should have your blood pressure measured twice-once when you are at a hospital or clinic, and once when you are not at a hospital or clinic. Record the average of the two measurements. To check your blood pressure when you are not at a hospital or clinic, you can use: ? An automated blood pressure machine at a pharmacy. ? A home blood pressure monitor.  If you are between 67 years and 63 years old, ask your health care provider if you should take aspirin to prevent strokes.  Have regular diabetes screenings. This involves taking a blood sample to check your fasting blood sugar level. ? If you are at a normal weight and have a low risk for diabetes, have this test once every three years after 55 years of age. ? If you are overweight and have a high risk for diabetes, consider being tested at a younger age or more often. Preventing infection Hepatitis B  If you have a higher risk for hepatitis B, you should be screened for this virus. You are considered at high risk for hepatitis B if: ? You were born in a country where hepatitis B is common. Ask your health care provider which countries are considered high risk. ? Your parents were born in a high-risk country, and you have not been immunized against hepatitis B (hepatitis B vaccine). ? You have HIV or AIDS. ? You use needles to inject street drugs. ? You live with someone who has  hepatitis B. ? You have had sex with someone who has hepatitis B. ? You get hemodialysis treatment. ? You take certain medicines for conditions, including cancer, organ transplantation, and autoimmune conditions.  Hepatitis C  Blood testing is recommended for: ? Everyone born from [birth year] through 1963. ? Anyone with known risk factors for hepatitis C.  Sexually transmitted infections (STIs)  You should be screened for sexually transmitted infections (STIs) including gonorrhea and chlamydia if: ? You are sexually active and are younger than 55 years of age. ? You are  older than 55 years of age and your health care provider tells you that you are at risk for this type of infection. ? Your sexual activity has changed since you were last screened and you are at an increased risk for chlamydia or gonorrhea. Ask your health care provider if you are at risk.  If you do not have HIV, but are at risk, it may be recommended that you take a prescription medicine daily to prevent HIV infection. This is called pre-exposure prophylaxis (PrEP). You are considered at risk if: ? You are sexually active and do not regularly use condoms or know the HIV status of your partner(s). ? You take drugs by injection. ? You are sexually active with a partner who has HIV.  Talk with your health care provider about whether you are at high risk of being infected with HIV. If you choose to begin PrEP, you should first be tested for HIV. You should then be tested every 3 months for as long as you are taking PrEP. Pregnancy  If you are premenopausal and you may become pregnant, ask your health care provider about preconception counseling.  If you may become pregnant, take 400 to 800 micrograms (mcg) of folic acid every day.  If you want to prevent pregnancy, talk to your health care provider about birth control (contraception). Osteoporosis and menopause  Osteoporosis is a disease in which the bones lose minerals and  strength with aging. This can result in serious bone fractures. Your risk for osteoporosis can be identified using a bone density scan.  If you are 23 years of age or older, or if you are at risk for osteoporosis and fractures, ask your health care provider if you should be screened.  Ask your health care provider whether you should take a calcium or vitamin D supplement to lower your risk for osteoporosis.  Menopause may have certain physical symptoms and risks.  Hormone replacement therapy may reduce some of these symptoms and risks. Talk to your health care provider about whether hormone replacement therapy is right for you. Follow these instructions at home:  Schedule regular health, dental, and eye exams.  Stay current with your immunizations.  Do not use any tobacco products including cigarettes, chewing tobacco, or electronic cigarettes.  If you are pregnant, do not drink alcohol.  If you are breastfeeding, limit how much and how often you drink alcohol.  Limit alcohol intake to no more than 1 drink per day for nonpregnant women. One drink equals 12 ounces of beer, 5 ounces of wine, or 1 ounces of hard liquor.  Do not use street drugs.  Do not share needles.  Ask your health care provider for help if you need support or information about quitting drugs.  Tell your health care provider if you often feel depressed.  Tell your health care provider if you have ever been abused or do not feel safe at home. This information is not intended to replace advice given to you by your health care provider. Make sure you discuss any questions you have with your health care provider. Document Released: 11/01/2010 Document Revised: 09/24/2015 Document Reviewed: 01/20/2015 Elsevier Interactive Patient Education  [employer].

## 2018-03-26 NOTE — Telephone Encounter (Signed)
[place]

## 2018-03-26 NOTE — Progress Notes (Signed)
Maria Franco, please let the patient know that her CBC is normal; other labs pending at this time

## 2018-03-26 NOTE — Progress Notes (Signed)
Patient ID: Maria Franco, female   DOB: 1961-04-15, 57 y.o.   MRN: [id number]   Subjective:   Maria Franco is a 57 y.o. female here for a complete physical exam  Interim issues since last visit: nothing  USPSTF grade A and B recommendations Depression:  Depression screen [facility] 2/9 03/26/2018 02/09/2018 07/24/2017 01/24/2017 12/15/2016  Decreased Interest 0 0 0 0 0  Down, Depressed, Hopeless 2 0 0 0 1  PHQ - 2 Score 2 0 0 0 1  Altered sleeping 0 0 - - -  Tired, decreased energy 0 0 - - -  Change in appetite 0 0 - - -  Feeling bad or failure about yourself  0 0 - - -  Trouble concentrating 0 0 - - -  Moving slowly or fidgety/restless 0 0 - - -  Suicidal thoughts 0 0 - - -  PHQ-9 Score 2 0 - - -  Difficult doing work/chores Not difficult at all Not difficult at all - - -   Hypertension: controlled BP Readings from Last 3 Encounters:  03/26/18 126/78  02/09/18 128/64  07/24/17 130/72   Obesity: down 3 pounds  Wt Readings from Last 3 Encounters:  03/26/18 183 lb 11.2 oz (83.3 kg)  02/09/18 186 lb 3.2 oz (84.5 kg)  07/24/17 183 lb 6.4 oz (83.2 kg)   BMI Readings from Last 3 Encounters:  03/26/18 30.57 kg/m  02/09/18 30.99 kg/m  07/24/17 30.52 kg/m   150-155 by June  Skin cancer: nothing worrisome, SK on and Lung cancer:  Current smoker; 1/2 ppd, depends on the day; advised chest CT for screening; declined, "next year" Breast cancer: no lumps or bumps; due for mammo, already ordered Colorectal cancer: she will call Cologuard to get the test delivered Cervical cancer screening: doing today; no known hx of abnormal pap smear BRCA gene screening: family hx of breast and/or ovarian cancer and/or metastatic prostate cancer? adopted HIV, hep B, hep C: not interested STD testing and prevention (chl/gon/syphilis): not interested Intimate partner violence: no abuse Contraception: n/a, BTL, ablation Osteoporosis: smoker, adopted Fall prevention/vitamin D: discussed; no supplements  for Vit D, but does get out in the sun Immunizations: not interested in the flu vaccine; interested in shingles vaccine Diet: dark greens, occasional dairy, almond milk Exercise: will be starting again soon, getting fit bit back Alcohol:    [facility] Visit from 03/26/2018 [facility]  AUDIT-C Score  4    not more than 7 drinsk per weeks  Tobacco use: discussed danger of vaping AAA: n/a Aspirin: aspirin plus brillinta, DAPT Glucose:  Glucose  Date Value Ref Range Status  04/23/2015 87 65 - 99 mg/dL Final  11/21/2014 109 (H) 65 - 99 mg/dL Final   Glucose, Bld  Date Value Ref Range Status  08/14/2017 92 65 - 99 mg/dL Final    Comment:    .            Fasting reference interval .   06/27/2016 98 65 - 99 mg/dL Final   Lipids:  Lab Results  Component Value Date   CHOL 104 08/14/2017   CHOL 109 06/27/2016   CHOL 144 04/23/2015   Lab Results  Component Value Date   HDL 50 (L) 08/14/2017   HDL 44 (L) 06/27/2016   HDL 41 04/23/2015   Lab Results  Component Value Date   LDLCALC 34 08/14/2017   LDLCALC 42 06/27/2016   [place] 67 04/23/2015   Lab Results  Component  Value Date   TRIG 121 08/14/2017   TRIG 117 06/27/2016   TRIG 180 (H) 04/23/2015   Lab Results  Component Value Date   CHOLHDL 2.1 08/14/2017   CHOLHDL 2.5 06/27/2016   No results found for: LDLDIRECT   Past Medical History:  Diagnosis Date  . Adrenal mass, left ([place])    followed by Endocrine, hormone testing q 5 years and CT q 1-2 years  . CAD (coronary artery disease)   . Depression   . Hyperlipidemia   . Hypertension   . MVP (mitral valve prolapse)   . Obesity   . Panic attacks   . Plantar fascia syndrome   . Presence of stent in LAD coronary artery   . 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 Tricuspid valve regurgitation    Past Surgical History:  Procedure Laterality Date  . CORONARY ANGIOPLASTY WITH STENT PLACEMENT  2006  .  ENDOMETRIAL ABLATION  2012  . HERNIA REPAIR     at 7 yrs of age  . TUBAL LIGATION  2012   Family History  Adopted: Yes  Family history unknown: Yes   Social History   Tobacco Use  . Smoking status: Current Every Day Smoker    Types: Cigarettes  . Smokeless tobacco: Former Systems developer  . Tobacco comment: vaper  Substance Use Topics  . Alcohol use: Yes    Alcohol/week: 0.0 standard drinks    Comment: Occasionally  . Drug use: No   Review of Systems  Constitutional: Negative for unexpected weight change.  HENT: Negative for nosebleeds.   Cardiovascular: Negative for chest pain and leg swelling.  Gastrointestinal: Negative for blood in stool.  Endocrine: Negative for polydipsia and polyuria.  Genitourinary: Negative for hematuria (nothing visible; already checked out by specialist).  Skin:       Nothing worrisome  Neurological: Negative for tremors.  Hematological: Bruises/bleeds easily (brillinta).    Objective:   Vitals:   03/26/18 0923  BP: 126/78  Pulse: 74  Temp: 98.7 F (37.1 C)  TempSrc: Oral  SpO2: 95%  Weight: 183 lb 11.2 oz (83.3 kg)  Height: _0  (1.651 m)   Body mass index is 30.57 kg/m. Wt Readings from Last 3 Encounters:  03/26/18 183 lb 11.2 oz (83.3 kg)  02/09/18 186 lb 3.2 oz (84.5 kg)  07/24/17 183 lb 6.4 oz (83.2 kg)   Physical Exam  Constitutional: She appears well-developed and well-nourished.  HENT:  Head: Normocephalic and atraumatic.  Eyes: Conjunctivae and EOM are normal. Right eye exhibits no hordeolum. Left eye exhibits no hordeolum. No scleral icterus.  Neck: Carotid bruit is not present. No thyromegaly present.  Cardiovascular: Normal rate, regular rhythm, S1 normal, S2 normal and normal heart sounds.  No extrasystoles are present.  Pulmonary/Chest: Effort normal and breath sounds normal. No respiratory distress. Right breast exhibits no inverted nipple, no mass, no nipple discharge, no skin change and no tenderness. Left breast  exhibits no inverted nipple, no mass, no nipple discharge, no skin change and no tenderness. Breasts are symmetrical.  Abdominal: Soft. Normal appearance and bowel sounds are normal. She exhibits no distension, no abdominal bruit, no pulsatile midline mass and no mass. There is no hepatosplenomegaly. There is no tenderness. No hernia.  Genitourinary: Uterus normal. Pelvic exam was performed with patient prone. There is no rash or lesion on the right labia. There is no rash or lesion on the left labia. Cervix exhibits no motion tenderness. Right adnexum displays no  mass, no tenderness and no fullness. Left adnexum displays no mass, no tenderness and no fullness.  Musculoskeletal: Normal range of motion. She exhibits no edema.  Lymphadenopathy:       Head (right side): No submandibular adenopathy present.       Head (left side): No submandibular adenopathy present.    She has no cervical adenopathy.    She has no axillary adenopathy.  Neurological: She is alert. She displays no tremor. No cranial nerve deficit. She exhibits normal muscle tone. Gait normal.  Skin: Skin is warm and dry. No bruising and no ecchymosis noted. No cyanosis. No pallor.  Psychiatric: Her speech is normal and behavior is normal. Thought content normal. Her mood appears not anxious. She does not exhibit a depressed mood.    Assessment/Plan:   Problem List Items Addressed This Visit      Other   Tobacco use    Advised low dose chest CT for colon cancer screening; she declined; wants to wait until next year      Preventative health care - Primary    USPSTF grade A and B recommendations reviewed with patient; age-appropriate recommendations, preventive care, screening tests, etc discussed and encouraged; healthy living encouraged; see AVS for patient education given to patient       Relevant Orders   CBC with Differential/Platelet   COMPLETE METABOLIC PANEL WITH GFR   Lipid panel   TSH   Obesity (BMI 30.0-34.9)     Encouragement given to lose weight; she is going to do Weight Watchers and get her Fit Bit back       Other Visit Diagnoses    Cervical cancer screening       Relevant Orders   Cytology - PAP       No orders of the defined types were placed in this encounter.  Orders Placed This Encounter  Procedures  . CBC with Differential/Platelet  . COMPLETE METABOLIC PANEL WITH GFR  . Lipid panel  . TSH    Follow up plan: No follow-ups on file.  An After Visit Summary was printed and given to the patient.

## 2018-03-26 NOTE — Telephone Encounter (Signed)
I just saw patient today Please confirm that she wants her refill to go to [place], [place]

## 2018-03-26 NOTE — Assessment & Plan Note (Signed)
USPSTF grade A and B recommendations reviewed with patient; age-appropriate recommendations, preventive care, screening tests, etc discussed and encouraged; healthy living encouraged; see AVS for patient education given to patient  

## 2018-03-26 NOTE — Telephone Encounter (Signed)
Left voice mail

## 2018-03-26 NOTE — Assessment & Plan Note (Signed)
Encouragement given to lose weight; she is going to do Weight Watchers and get her Fit Bit back

## 2018-03-27 ENCOUNTER — Encounter: Payer: Self-pay | Admitting: Family Medicine

## 2018-03-27 DIAGNOSIS — R8789 Other abnormal findings in specimens from female genital organs: Secondary | ICD-10-CM | POA: Insufficient documentation

## 2018-03-27 DIAGNOSIS — R87618 Other abnormal cytological findings on specimens from cervix uteri: Secondary | ICD-10-CM | POA: Insufficient documentation

## 2018-03-27 LAB — LIPID PANEL
Cholesterol: 121 mg/dL (ref <200)
HDL: 58 mg/dL (ref >50)
LDL Cholesterol (Calc): 44 mg/dL (calc)
NON-HDL CHOLESTEROL (CALC): 63 mg/dL (ref <130)
TRIGLYCERIDES: 105 mg/dL (ref <150)
Total CHOL/HDL Ratio: 2.1 (calc) (ref <5.0)

## 2018-03-27 LAB — COMPLETE METABOLIC PANEL WITH GFR
AG RATIO: 2.1 (calc) (ref 1.0–2.5)
ALBUMIN MSPROF: 4.7 g/dL (ref 3.6–5.1)
ALT: 25 U/L (ref 6–29)
AST: 23 U/L (ref 10–35)
Alkaline phosphatase (APISO): 74 U/L (ref 33–130)
BUN: 20 mg/dL (ref 7–25)
CALCIUM: 10 mg/dL (ref 8.6–10.4)
CO2: 28 mmol/L (ref 20–32)
Chloride: 101 mmol/L (ref 98–110)
Creat: 1.03 mg/dL (ref 0.50–1.05)
GFR, EST AFRICAN AMERICAN: 71 mL/min/{1.73_m2} (ref >=60)
GFR, EST NON AFRICAN AMERICAN: 61 mL/min/{1.73_m2} (ref >=60)
GLUCOSE: 81 mg/dL (ref 65–139)
Globulin: 2.2 g/dL (calc) (ref 1.9–3.7)
POTASSIUM: 3.8 mmol/L (ref 3.5–5.3)
Sodium: 139 mmol/L (ref 135–146)
TOTAL PROTEIN: 6.9 g/dL (ref 6.1–8.1)
Total Bilirubin: 0.4 mg/dL (ref 0.2–1.2)

## 2018-03-27 LAB — CBC WITH DIFFERENTIAL/PLATELET
BASOS PCT: 0.6 %
Basophils Absolute: 56 cells/uL (ref 0–200)
EOS PCT: 1.4 %
Eosinophils Absolute: 132 cells/uL (ref 15–500)
HCT: 42.6 % (ref 35.0–45.0)
HEMOGLOBIN: 14.4 g/dL (ref 11.7–15.5)
Lymphs Abs: 1880 cells/uL (ref 850–3900)
MCH: 31.9 pg (ref 27.0–33.0)
MCHC: 33.8 g/dL (ref 32.0–36.0)
MCV: 94.5 fL (ref 80.0–100.0)
MONOS PCT: 6.9 %
MPV: 10 fL (ref 7.5–12.5)
NEUTROS ABS: 6683 {cells}/uL (ref 1500–7800)
Neutrophils Relative %: 71.1 %
Platelets: 213 10*3/uL (ref 140–400)
RBC: 4.51 10*6/uL (ref 3.80–5.10)
RDW: 13.1 % (ref 11.0–15.0)
Total Lymphocyte: 20 %
WBC mixed population: 649 cells/uL (ref 200–950)
WBC: 9.4 10*3/uL (ref 3.8–10.8)

## 2018-03-27 LAB — CYTOLOGY - PAP
ADEQUACY: ABSENT
Diagnosis: NEGATIVE
HPV: DETECTED — AB

## 2018-03-27 LAB — TSH: TSH: 1.61 m[IU]/L

## 2018-03-27 NOTE — Progress Notes (Signed)
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 We'd like her to see a gynecologist so they can take a closer look at her cervix; please REFER to ob-gyn, Dr. Kenton Kingfisher, positive HPV, absent endocervical cells

## 2018-03-28 ENCOUNTER — Telehealth: Payer: Self-pay

## 2018-03-28 DIAGNOSIS — N289 Disorder of kidney and ureter, unspecified: Secondary | ICD-10-CM

## 2018-03-28 DIAGNOSIS — R8789 Other abnormal findings in specimens from female genital organs: Secondary | ICD-10-CM

## 2018-03-28 DIAGNOSIS — R87618 Other abnormal cytological findings on specimens from cervix uteri: Secondary | ICD-10-CM

## 2018-03-28 NOTE — Telephone Encounter (Signed)
Labs ordered. Referral Entered.   Pt would like you to call her back in regards to the positive HPV. She is extremely concerned and has several questions.

## 2018-03-28 NOTE — Telephone Encounter (Signed)
I returned patient's call She has never been diagnosed with HPV in the past She had genital warts in her late teens or early 70s Will get her to OB-GYN; he may want to repeat the pap since no endocervical component or look more closely (colpo) but will defer to him  Discussed GFR and creatinine No NSAIDs Not enough water so she'll work on that Not much red meat Smoking discussed; she'll try to quit

## 2018-04-02 NOTE — Telephone Encounter (Signed)
Patient is schedule 04/17/18 with [facility]

## 2018-04-06 DIAGNOSIS — I2510 Atherosclerotic heart disease of native coronary artery without angina pectoris: Secondary | ICD-10-CM | POA: Diagnosis not present

## 2018-04-06 DIAGNOSIS — E782 Mixed hyperlipidemia: Secondary | ICD-10-CM | POA: Diagnosis not present

## 2018-04-06 DIAGNOSIS — Z9861 Coronary angioplasty status: Secondary | ICD-10-CM | POA: Diagnosis not present

## 2018-04-06 DIAGNOSIS — I10 Essential (primary) hypertension: Secondary | ICD-10-CM | POA: Diagnosis not present

## 2018-04-17 ENCOUNTER — Other Ambulatory Visit (HOSPITAL_COMMUNITY)
Admission: RE | Admit: 2018-04-17 | Discharge: 2018-04-17 | Disposition: A | Discharge status: Home / Self Care | Payer: BLUE CROSS/BLUE SHIELD | Source: Ambulatory Visit | Attending: Obstetrics & Gynecology | Admitting: Obstetrics & Gynecology

## 2018-04-17 ENCOUNTER — Encounter: Payer: Self-pay | Admitting: Obstetrics & Gynecology

## 2018-04-17 ENCOUNTER — Ambulatory Visit (INDEPENDENT_AMBULATORY_CARE_PROVIDER_SITE_OTHER): Payer: BLUE CROSS/BLUE SHIELD | Admitting: Obstetrics & Gynecology

## 2018-04-17 VITALS — BP 120/80 | Ht 65.5 in | Wt 188.0 lb

## 2018-04-17 DIAGNOSIS — B977 Papillomavirus as the cause of diseases classified elsewhere: Secondary | ICD-10-CM | POA: Insufficient documentation

## 2018-04-17 DIAGNOSIS — N870 Mild cervical dysplasia: Secondary | ICD-10-CM

## 2018-04-17 NOTE — Patient Instructions (Signed)

## 2018-04-17 NOTE — Progress Notes (Signed)
Referring Provider:  Dr Sanda Klein  HPI:  Maria Franco is a 57 y.o.  No obstetric history on file.  who presents today for evaluation and management of abnormal cervical cytology.    Dysplasia History:  HPV by recent PAP, also poor endocervical sampling    Prior HPV warts in her 20's    No h/o abn PAP prior  ROS:  Pertinent items are noted in HPI.  OB History  No obstetric history on file.    Past Medical History:  Diagnosis Date  . Adrenal mass, left ([place])    followed by Endocrine, hormone testing q 5 years and CT q 1-2 years  . CAD (coronary artery disease)   . Depression   . Hyperlipidemia   . Hypertension   . MVP (mitral valve prolapse)   . Obesity   . Panic attacks   . Plantar fascia syndrome   . Presence of stent in LAD coronary artery   . 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 Tricuspid valve regurgitation     Past Surgical History:  Procedure Laterality Date  . CORONARY ANGIOPLASTY WITH STENT PLACEMENT  2006  . ENDOMETRIAL ABLATION  2012  . HERNIA REPAIR     at 7 yrs of age  . TUBAL LIGATION  2012    SOCIAL HISTORY: Social History   Substance and Sexual Activity  Alcohol Use Yes  . Alcohol/week: 0.0 standard drinks   Comment: Occasionally   Social History   Substance and Sexual Activity  Drug Use No     Family History  Adopted: Yes  Family history unknown: Yes    ALLERGIES:  Dilaudid [hydromorphone hcl] and Hydromorphone  Current Outpatient Medications on File Prior to Visit  Medication Sig Dispense Refill  . amLODipine (NORVASC) 5 MG tablet Take 1 tablet (5 mg total) by mouth daily. 90 tablet 3  . aspirin 81 MG tablet Take 81 mg by mouth daily.    Marland Kitchen losartan-hydrochlorothiazide (HYZAAR) 100-25 MG tablet Take 1 tablet by mouth daily. 90 tablet 0  . metoprolol succinate (TOPROL-XL) 50 MG 24 hr tablet Take 1 tablet (50 mg total) by mouth daily. Take with or immediately following a meal. 90 tablet 0  . rosuvastatin  (CRESTOR) 40 MG tablet Take 1 tablet (40 mg total) by mouth every morning. 90 tablet 1  . ticagrelor (BRILINTA) 60 MG TABS tablet Take 60 mg by mouth 2 (two) times daily.    Marland Kitchen venlafaxine XR (EFFEXOR XR) 75 MG 24 hr capsule Take 1 capsule (75 mg total) by mouth daily with breakfast. 90 capsule 0  . busPIRone (BUSPAR) 15 MG tablet Take 0.5 tablets (7.5 mg total) by mouth 2 (two) times daily as needed. 30 tablet 1   No current facility-administered medications on file prior to visit.     Physical Exam: -Vitals:  BP 120/80   Ht 5' 5.5" (1.664 m)   Wt 188 lb (85.3 kg)   BMI 30.81 kg/m  GEN: WD, WN, NAD.  A+ O x 3, good mood and affect. ABD:  NT, ND.  Soft, no masses.  No hernias noted.   Pelvic:   Vulva: Normal appearance.  No lesions.  Vagina: No lesions or abnormalities noted.  Support: Normal pelvic support.  Urethra No masses tenderness or scarring.  Meatus Normal size without lesions or prolapse.  Cervix: See below.  Anus: Normal exam.  No lesions.  Perineum: Normal exam.  No lesions.  Bimanual   Uterus: Normal size.  Non-tender.  Mobile.  AV.  Adnexae: No masses.  Non-tender to palpation.  Cul-de-sac: Negative for abnormality.   PROCEDURE: 1.  Urine Pregnancy Test:  not done 2.  Colposcopy performed with 4% acetic acid after verbal consent obtained                           - [place]arrow TZ              -Aceto-white Lesions Location(s): None              -Biopsy performed at 6, 12 o'clock               -ECC indicated and performed: Yes.       -Biopsy sites made hemostatic with pressure, AgNO3, and/or Monsel's solution   -Satisfactory colposcopy: Yes.  Able to get into endocervix for sampling    -Evidence of Invasive cervical CA :  NO  ASSESSMENT:  Maria Franco is a 57 y.o. No obstetric history on file. here for  1. 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  I discussed the grading system of pap smears and HPV high risk viral types.  We will discuss and base  management after colpo results return. 2. Follow up PAP 6 months, vs intervention if high grade dysplasia identified 3. Treatment of persistantly abnormal PAP smears and cervical dysplasia, even mild, is discussed w pt today in detail, as well as the pros and cons of Cryo and LEEP procedures. Will consider and discuss after results. Also, if inadequate endocervical pathology then consider LEEP     Barnett Applebaum, MD, [facility], [facility] 04/17/2018  3:53 PM

## 2018-04-18 ENCOUNTER — Other Ambulatory Visit: Payer: Self-pay | Admitting: Family Medicine

## 2018-04-19 NOTE — Progress Notes (Signed)
Sch appt for follow up 6 mos

## 2018-04-20 ENCOUNTER — Telehealth: Payer: Self-pay | Admitting: Obstetrics & Gynecology

## 2018-04-20 NOTE — Telephone Encounter (Signed)
Patient is schedule 10/16/17 with [facility]

## 2018-04-20 NOTE — Telephone Encounter (Signed)
-----   Message from Gae Dry, MD sent at 04/19/2018  6:04 PM EST ----- Sch appt for follow up 6 mos

## 2018-04-21 ENCOUNTER — Other Ambulatory Visit: Payer: Self-pay | Admitting: Family Medicine

## 2018-04-21 NOTE — Telephone Encounter (Signed)
Lab Results  Component Value Date   ALT 25 03/26/2018   Lab Results  Component Value Date   CHOL 121 03/26/2018   HDL 58 03/26/2018   LDLCALC 44 03/26/2018   TRIG 105 03/26/2018   CHOLHDL 2.1 03/26/2018

## 2018-05-06 DIAGNOSIS — R197 Diarrhea, unspecified: Secondary | ICD-10-CM | POA: Diagnosis not present

## 2018-05-06 DIAGNOSIS — A049 Bacterial intestinal infection, unspecified: Secondary | ICD-10-CM | POA: Diagnosis not present

## 2018-05-06 DIAGNOSIS — R112 Nausea with vomiting, unspecified: Secondary | ICD-10-CM | POA: Diagnosis not present

## 2018-05-10 IMAGING — CT CT ABDOMEN W/O CM
1 of 2 series · 14 of 32 positions shown, 19 images · non-contrast
Comparison: 06/07/2013

CLINICAL DATA: Followup left adrenal mass.

EXAM:
CT ABDOMEN WITHOUT CONTRAST
TECHNIQUE: Multidetector CT imaging of the abdomen was performed following the
standard protocol without IV contrast.

[Series 2: axial st · axial · 0.85mm/px · z∈[-827,-545]mm · 14 of 106 slices shown, 19 images]
[im 6/106  soft-tissue]
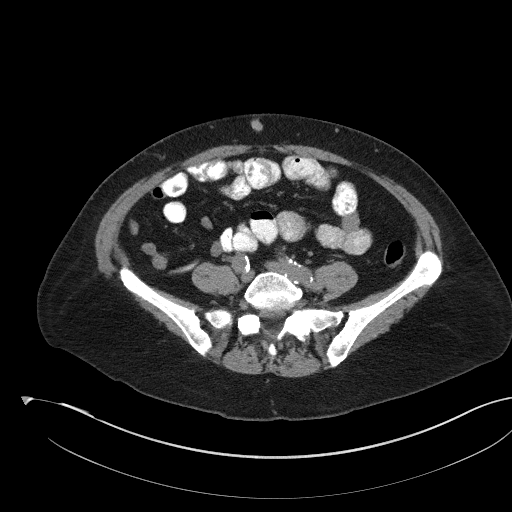
[im 6/106  bone]
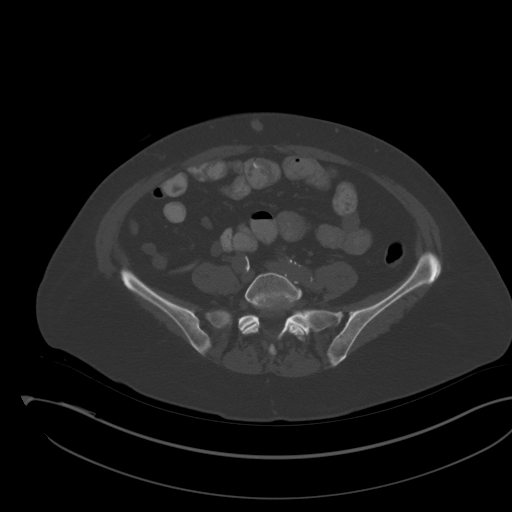
[im 16/106  soft-tissue]
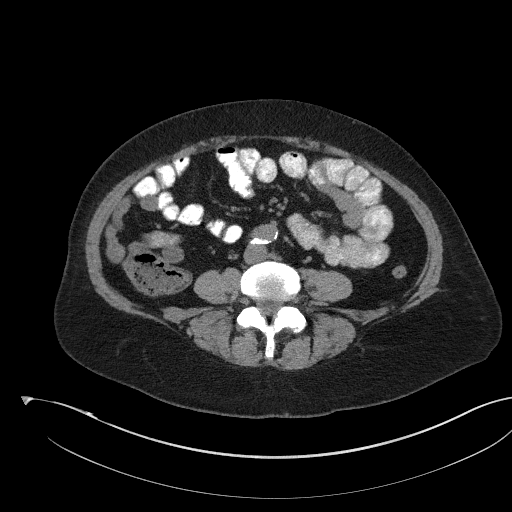
[im 22/106  soft-tissue]
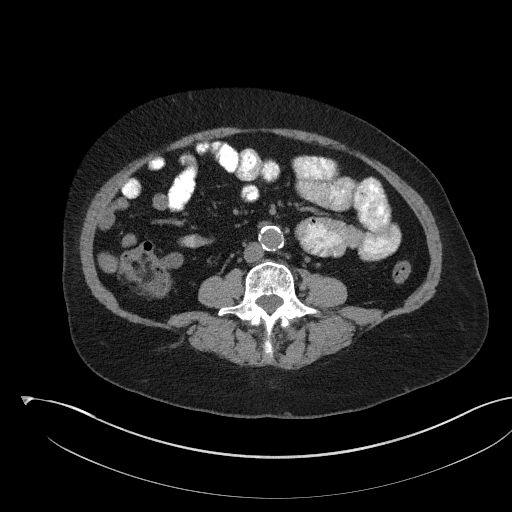
[im 32/106  soft-tissue]
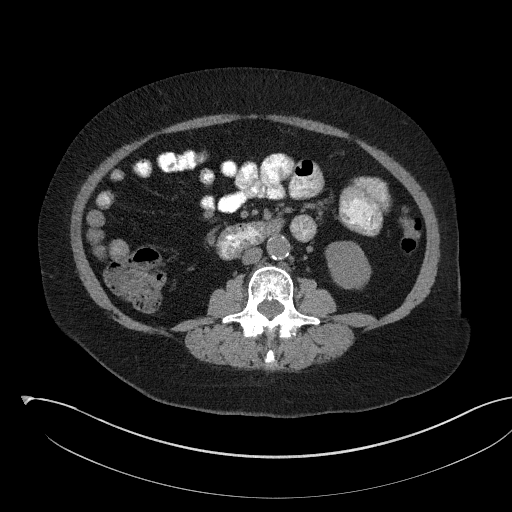
[im 37/106  soft-tissue]
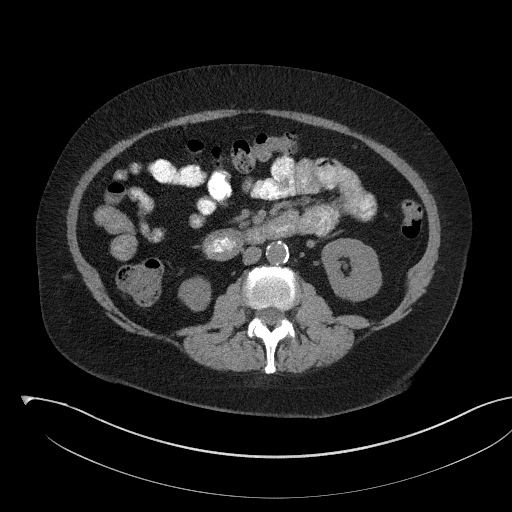
[im 48/106  soft-tissue]
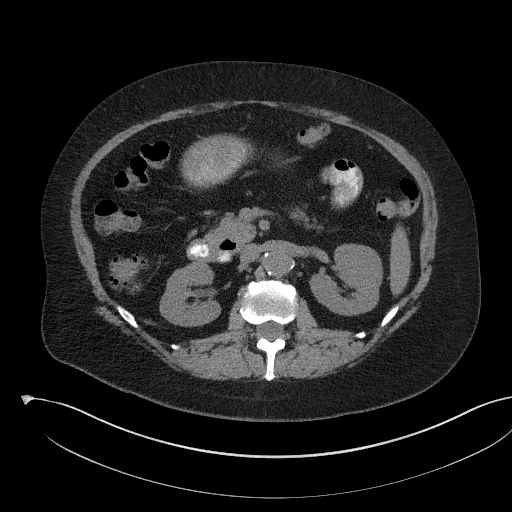
[im 53/106  soft-tissue]
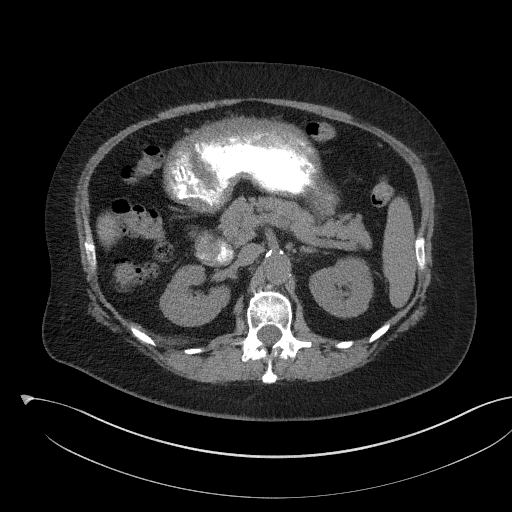
[im 58/106  soft-tissue]
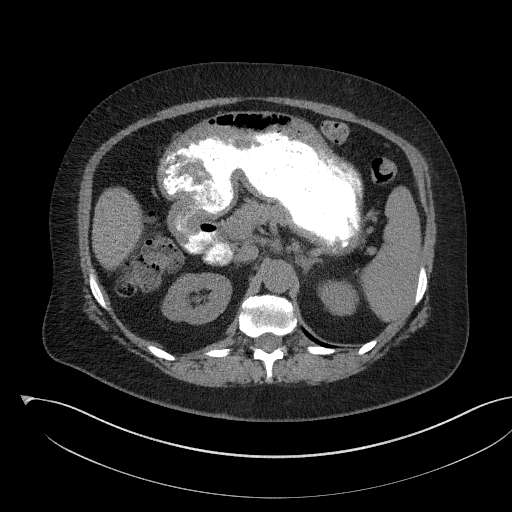
[im 69/106  soft-tissue]
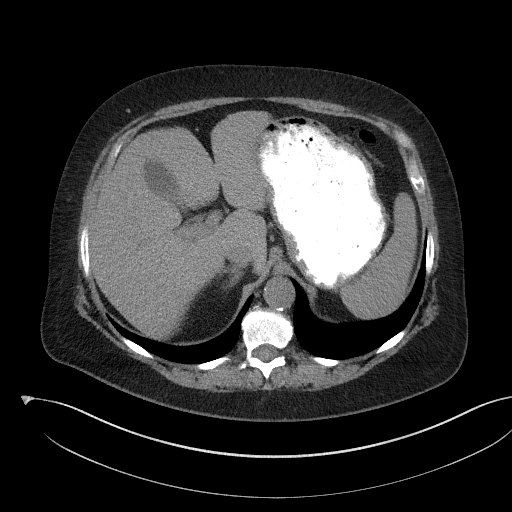
[im 69/106  bone]
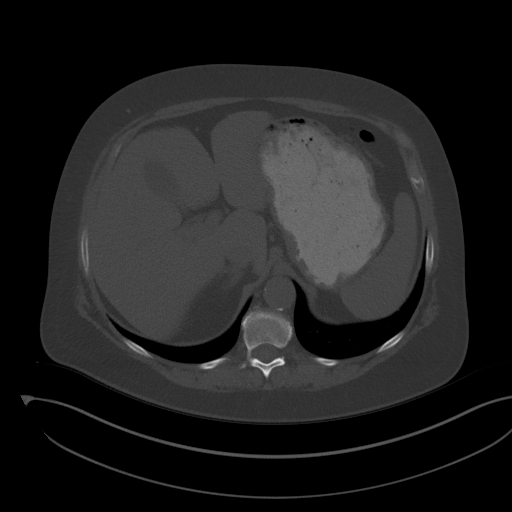
[im 74/106  soft-tissue]
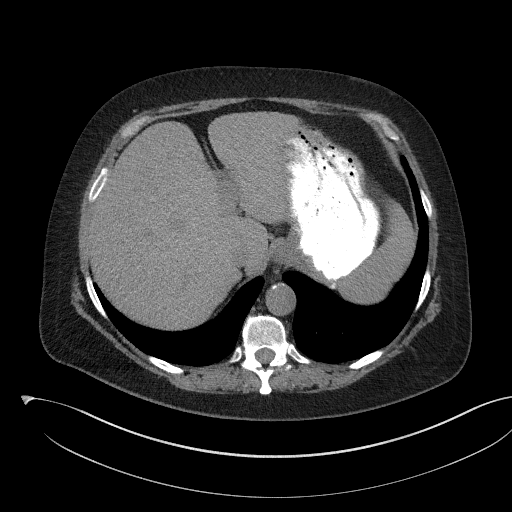
[im 85/106  soft-tissue]
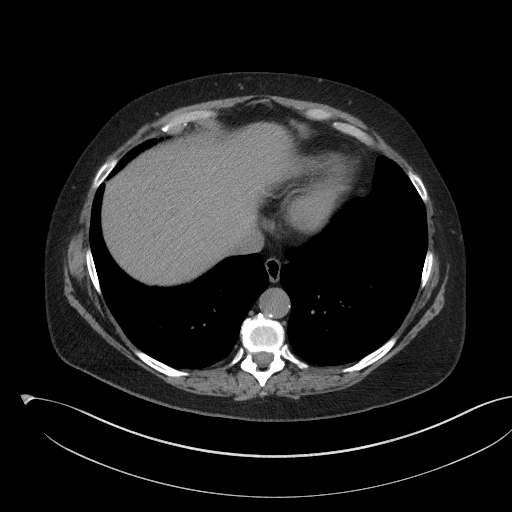
[im 85/106  lung]
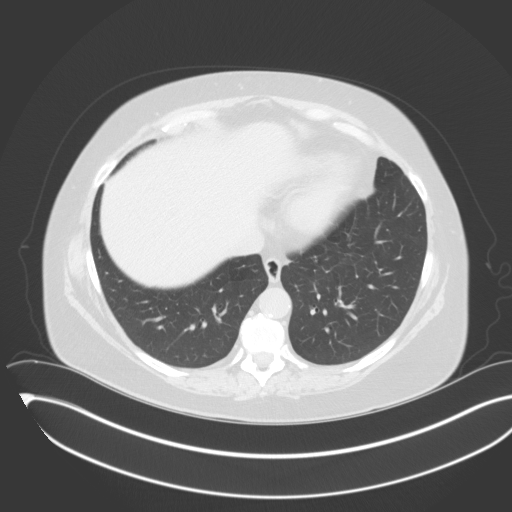
[im 90/106  soft-tissue]
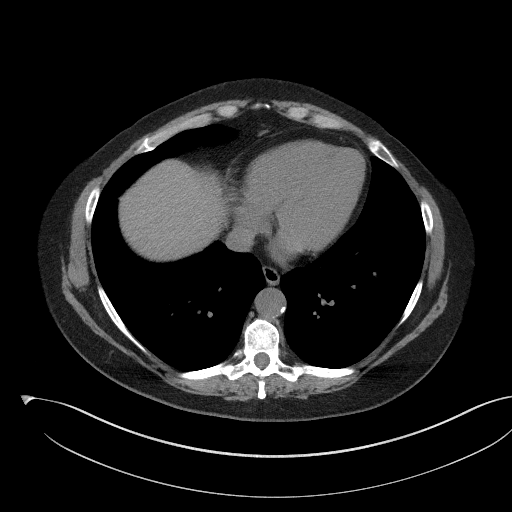
[im 90/106  lung]
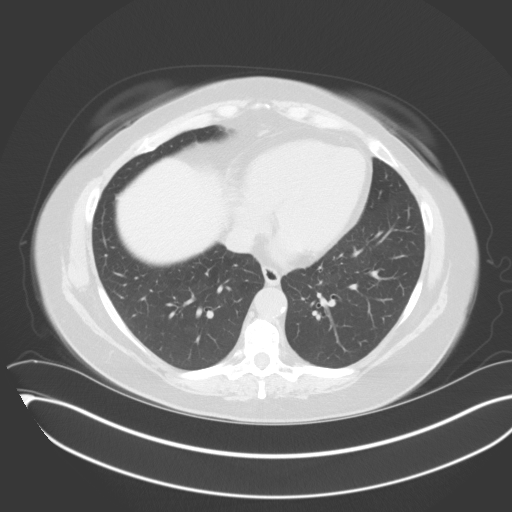
[im 95/106  lung]
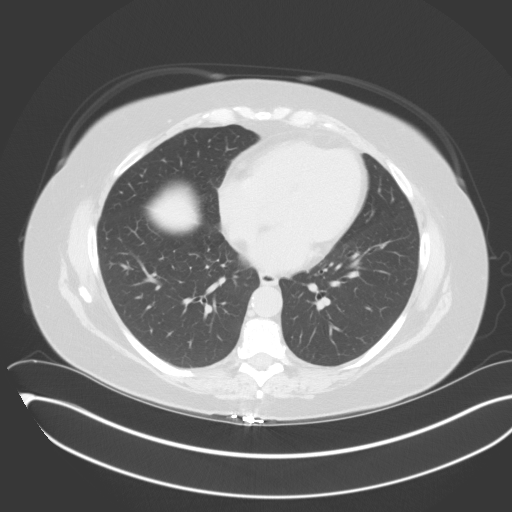
[im 100/106  soft-tissue]
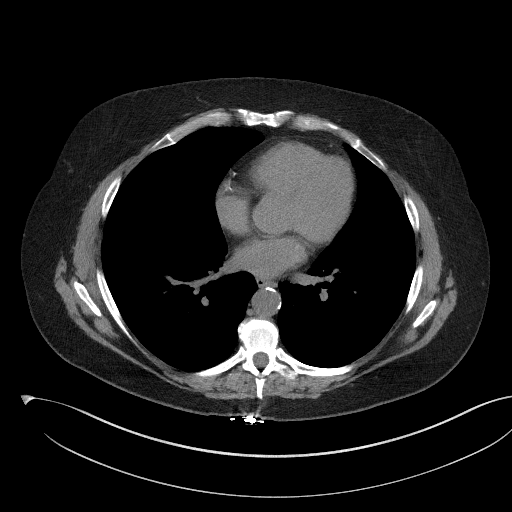
[im 100/106  lung]
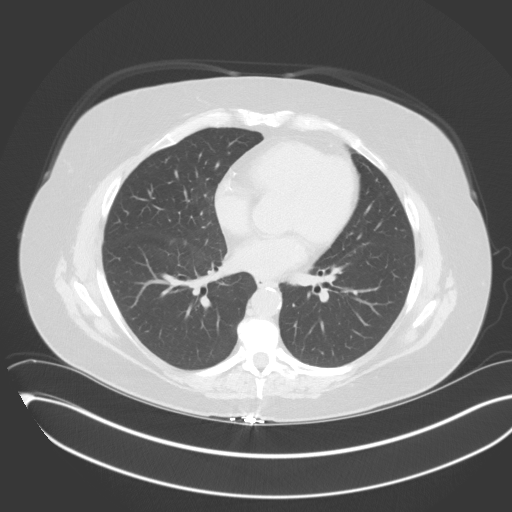

[14 of 32 positions shown; findings below may reference images not displayed]

FINDINGS: Lower chest: No acute findings.

Hepatobiliary: No masses visualized on this unenhanced exam.
Gallbladder is unremarkable.

Pancreas: No mass or inflammatory process visualized on this
unenhanced exam.

Spleen:  Within normal limits in size.

Adrenals/Urinary tract: 1.2 cm left adrenal nodule remains stable,
consistent with benign adenoma or nodular hyperplasia. No evidence
of urolithiasis or hydronephrosis.

Stomach/Bowel: Visualized portions within abdomen are unremarkable.

Vascular/Lymphatic: No pathologically enlarged lymph nodes
identified. No evidence of abdominal aortic aneurysm. Aortic
atherosclerosis.

Other:  None.

Musculoskeletal:  No suspicious bone lesions identified.
IMPRESSION: Stable small left adrenal nodule, consistent with benign adenoma or
nodular hyperplasia.

No acute findings.  Aortic atherosclerosis.

## 2018-06-04 ENCOUNTER — Other Ambulatory Visit: Payer: Self-pay | Admitting: Family Medicine

## 2018-06-04 NOTE — Telephone Encounter (Signed)
See 03/26/2018 note about her CMP results She is past due for labs I'd like to see those for her refill please I'll approve a limited Rx so she can get in her for the urine and bloodwork

## 2018-06-05 NOTE — Telephone Encounter (Signed)
Pt notified. Stated she will try to come by this Friday.

## 2018-06-08 DIAGNOSIS — N289 Disorder of kidney and ureter, unspecified: Secondary | ICD-10-CM | POA: Diagnosis not present

## 2018-06-09 LAB — MICROALBUMIN / CREATININE URINE RATIO
CREATININE, URINE: 95 mg/dL (ref 20–275)
MICROALB UR: 27.1 mg/dL
Microalb Creat Ratio: 285 mcg/mg creat — ABNORMAL HIGH (ref <30)

## 2018-06-09 LAB — BASIC METABOLIC PANEL
BUN: 12 mg/dL (ref 7–25)
CALCIUM: 9.8 mg/dL (ref 8.6–10.4)
CO2: 29 mmol/L (ref 20–32)
CREATININE: 0.99 mg/dL (ref 0.50–1.05)
Chloride: 103 mmol/L (ref 98–110)
GLUCOSE: 101 mg/dL — AB (ref 65–99)
POTASSIUM: 4.1 mmol/L (ref 3.5–5.3)
Sodium: 143 mmol/L (ref 135–146)

## 2018-06-10 ENCOUNTER — Other Ambulatory Visit: Payer: Self-pay | Admitting: Family Medicine

## 2018-06-10 DIAGNOSIS — R809 Proteinuria, unspecified: Secondary | ICD-10-CM

## 2018-06-10 DIAGNOSIS — N289 Disorder of kidney and ureter, unspecified: Secondary | ICD-10-CM

## 2018-06-10 NOTE — Progress Notes (Signed)
24 hour urine for protein Renal US ordered

## 2018-06-10 NOTE — Telephone Encounter (Signed)
Maria Franco, please let the patient know that she is spilling protein through her kidneys, so we need to do some more testing First, we'll encourage her to stop smoking; smoking damages the kidneys; give her the 1-800-QUIT-NOW number and the number to the free smoking cessation classes at the hospital Please have her get a renal ultrasound and 24 hour urine collection for protein (I'll order)

## 2018-06-14 ENCOUNTER — Telehealth: Payer: Self-pay

## 2018-06-14 ENCOUNTER — Other Ambulatory Visit: Payer: Self-pay

## 2018-06-14 ENCOUNTER — Ambulatory Visit
Admission: RE | Admit: 2018-06-14 | Discharge: 2018-06-14 | Disposition: A | Discharge status: Home / Self Care | Payer: BLUE CROSS/BLUE SHIELD | Source: Ambulatory Visit | Attending: Family Medicine | Admitting: Family Medicine

## 2018-06-14 DIAGNOSIS — R809 Proteinuria, unspecified: Secondary | ICD-10-CM

## 2018-06-14 DIAGNOSIS — N289 Disorder of kidney and ureter, unspecified: Secondary | ICD-10-CM

## 2018-06-14 MED ORDER — LOSARTAN POTASSIUM-HCTZ 100-25 MG PO TABS: 1.0000 | ORAL_TABLET | Freq: Every day | ORAL | 0 refills | Status: DC

## 2018-06-14 NOTE — Telephone Encounter (Signed)
Pt completed her renal US this morning and picked up her 24 hour urine supplies. Stated that after Saturday's dose she will be completely out of her BP medicine.

## 2018-06-14 NOTE — Telephone Encounter (Signed)
Please let patient know that her ultrasound looks fine. I have sent in refills of the losartan-HCTZ. Let me know if she needs others.

## 2018-06-14 NOTE — Progress Notes (Signed)
Maria Franco, please let the patient know that her renal ultrasound is normal. No masses, no cysts, no evidence of medical renal disease. Good news. Thank you

## 2018-06-15 ENCOUNTER — Telehealth: Payer: Self-pay | Admitting: Family Medicine

## 2018-06-15 DIAGNOSIS — R809 Proteinuria, unspecified: Secondary | ICD-10-CM | POA: Diagnosis not present

## 2018-06-15 DIAGNOSIS — N289 Disorder of kidney and ureter, unspecified: Secondary | ICD-10-CM | POA: Diagnosis not present

## 2018-06-15 MED ORDER — METOPROLOL SUCCINATE ER 50 MG PO TB24: 50.0000 mg | ORAL_TABLET | Freq: Every day | ORAL | 1 refills | Status: DC

## 2018-06-15 NOTE — Addendum Note (Signed)
Addended by: Daeshon Grammatico, Satira Anis on: 06/15/2018 11:11 AM   Modules accepted: Orders

## 2018-06-15 NOTE — Telephone Encounter (Signed)
Left detailed VM. CRM created.  

## 2018-06-15 NOTE — Addendum Note (Signed)
Addended by: Matilde Sprang on: 06/15/2018 10:02 AM   Modules accepted: Orders

## 2018-06-15 NOTE — Telephone Encounter (Signed)
Patient called and asked for her ultrasound results. Results given per notes Dr. Sanda Klein below on 06/14/18, patient verbalized understanding. She says she will need a refill on Metoprolol sent to her pharmacy. I advised to ask the pharmacy about the refill of Amlodipine and Effexor, because refills should be at the pharmacy. If refills are needed, advised to call us back.

## 2018-06-15 NOTE — Telephone Encounter (Signed)
Patient has care gap for colon cancer screening and an unresolved Cologuard order Please contact patient, urge them to complete the Cologuard kit; offer to re-order if needed Thank you

## 2018-06-15 NOTE — Telephone Encounter (Signed)
Patient called and she was advised of the note by Dr. Sanda Klein below on 06/15/18, patient verbalized understanding and says the cologuard will be mailed out on Tuesday. She says she was completing the 24 hour urine and was going to do it at the same time, but the instructions said to make sure there was no holiday, so she says Tuesday it will be mailed out.

## 2018-06-16 LAB — PROTEIN, URINE, 24 HOUR: Protein, 24H Urine: 300 mg/24 h — ABNORMAL HIGH (ref 0–149)

## 2018-06-18 ENCOUNTER — Other Ambulatory Visit: Payer: Self-pay | Admitting: Family Medicine

## 2018-06-18 MED ORDER — HYDROCHLOROTHIAZIDE 25 MG PO TABS: 25.0000 mg | ORAL_TABLET | Freq: Every day | ORAL | 0 refills | Status: DC

## 2018-06-18 MED ORDER — LOSARTAN POTASSIUM 100 MG PO TABS: 100.0000 mg | ORAL_TABLET | Freq: Every day | ORAL | 0 refills | Status: DC

## 2018-06-18 NOTE — Progress Notes (Signed)
Pharmacy requested to split up the losartan and hctz That's fine New Rxs sent

## 2018-06-18 NOTE — Progress Notes (Signed)
Maria Franco, please let the patient know that she is in fact spilling excess protein through her kidneys; we'll encourage smoking cessation and REFER her to a kidney doctor, please REFER to nephrologist for proteinuria; they will probably do more testing to find out the cause and most importantly how to slow or stop anything that might be causing any damage to her kidneys; thank you

## 2018-06-19 ENCOUNTER — Telehealth: Payer: Self-pay

## 2018-06-19 DIAGNOSIS — R809 Proteinuria, unspecified: Secondary | ICD-10-CM

## 2018-06-19 NOTE — Telephone Encounter (Signed)
-----   Message from Arnetha Courser, MD sent at 06/18/2018  5:20 PM EST ----- Joelene Millin, please let the patient know that she is in fact spilling excess protein through her kidneys; we'll encourage smoking cessation and REFER her to a kidney doctor, please REFER to nephrologist for proteinuria; they will probably do more testing to find out the cause and most importantly how to slow or stop anything that might be causing any damage to her kidneys; thank you

## 2018-08-16 ENCOUNTER — Other Ambulatory Visit: Payer: Self-pay | Admitting: Family Medicine

## 2018-09-27 ENCOUNTER — Ambulatory Visit (INDEPENDENT_AMBULATORY_CARE_PROVIDER_SITE_OTHER): Payer: BLUE CROSS/BLUE SHIELD | Admitting: Nurse Practitioner

## 2018-09-27 ENCOUNTER — Encounter: Payer: Self-pay | Admitting: Nurse Practitioner

## 2018-09-27 DIAGNOSIS — E782 Mixed hyperlipidemia: Secondary | ICD-10-CM

## 2018-09-27 DIAGNOSIS — E278 Other specified disorders of adrenal gland: Secondary | ICD-10-CM

## 2018-09-27 DIAGNOSIS — F419 Anxiety disorder, unspecified: Secondary | ICD-10-CM

## 2018-09-27 DIAGNOSIS — I2510 Atherosclerotic heart disease of native coronary artery without angina pectoris: Secondary | ICD-10-CM | POA: Diagnosis not present

## 2018-09-27 DIAGNOSIS — I2583 Coronary atherosclerosis due to lipid rich plaque: Secondary | ICD-10-CM

## 2018-09-27 DIAGNOSIS — F410 Panic disorder [episodic paroxysmal anxiety] without agoraphobia: Secondary | ICD-10-CM

## 2018-09-27 DIAGNOSIS — I10 Essential (primary) hypertension: Secondary | ICD-10-CM

## 2018-09-27 DIAGNOSIS — I341 Nonrheumatic mitral (valve) prolapse: Secondary | ICD-10-CM

## 2018-09-27 DIAGNOSIS — F329 Major depressive disorder, single episode, unspecified: Secondary | ICD-10-CM

## 2018-09-27 DIAGNOSIS — Z1211 Encounter for screening for malignant neoplasm of colon: Secondary | ICD-10-CM

## 2018-09-27 DIAGNOSIS — R809 Proteinuria, unspecified: Secondary | ICD-10-CM

## 2018-09-27 DIAGNOSIS — F32A Depression, unspecified: Secondary | ICD-10-CM

## 2018-09-27 MED ORDER — AMLODIPINE BESYLATE 5 MG PO TABS: 5.0000 mg | ORAL_TABLET | Freq: Every day | ORAL | 1 refills | Status: DC

## 2018-09-27 MED ORDER — LOSARTAN POTASSIUM 100 MG PO TABS: 100.0000 mg | ORAL_TABLET | Freq: Every day | ORAL | 1 refills | Status: DC

## 2018-09-27 MED ORDER — ROSUVASTATIN CALCIUM 40 MG PO TABS: 40.0000 mg | ORAL_TABLET | ORAL | 1 refills | Status: DC

## 2018-09-27 MED ORDER — METOPROLOL SUCCINATE ER 50 MG PO TB24: 50.0000 mg | ORAL_TABLET | Freq: Every day | ORAL | 1 refills | Status: DC

## 2018-09-27 MED ORDER — VENLAFAXINE HCL ER 75 MG PO CP24: 75.0000 mg | ORAL_CAPSULE | Freq: Every day | ORAL | 1 refills | Status: DC

## 2018-09-27 MED ORDER — HYDROCHLOROTHIAZIDE 25 MG PO TABS: 25.0000 mg | ORAL_TABLET | Freq: Every day | ORAL | 1 refills | Status: DC

## 2018-09-27 NOTE — Progress Notes (Signed)
Virtual Visit via Video Note  I connected with Maria Franco on 09/27/18 at 10:40 AM EDT by a video enabled telemedicine application and verified that I am speaking with the correct person using two identifiers.   Staff discussed the limitations of evaluation and management by telemedicine and the availability of in person appointments. The patient expressed understanding and agreed to proceed.  Patient location: home  My location: work office Other people present: none HPI Hypertension Patient is on losartan 100mg , HCTZ 25mg , amlodipine 5mg , metoprolol 50mg .  Takes medications as prescribed with occasional missed doses a month.  Relatively compliant with low-salt diet.  Denies chest pain, headaches, blurry vision.  BP Readings from Last 3 Encounters:  04/17/18 120/80  03/26/18 126/78  02/09/18 128/64   Coronary artery disease Patient sees cardiologist Dr. Humphrey Rolls.  She is taking Brilinta and 81 mg aspirin.  Is status post PCI of the LAD in 2006. Denies excessive bruising or bleeding, no blood in stools.   Hyperlipidemia Patient rx crestor 40mg  Takes medications as prescribed with few missed doses a month.  She does have coronary artery disease. Denies myalgias Lab Results  Component Value Date   CHOL 121 03/26/2018   HDL 58 03/26/2018   LDLCALC 44 03/26/2018   TRIG 105 03/26/2018   CHOLHDL 2.1 03/26/2018    Adrenal mass Was cleared by Dr. Cruzita Lederer, endocrinologist in 2015 after testing for Cushing's, pheochromocytoma, hyperaldosteronism.  She was sent for consultation again by PCP and evaluated by Dr. Gabriel Carina endocrinologist In 2018 who repeated imaging and pheochromocytoma testing which all came back normal and adrenal nodule was stable.  She did not recommend any further testing.  Proteinuria PCP noted microalbuminemia and proteinuria.  Renal ultrasound was normal.  Kidney function preserved.  She was referred to nephrology states due to pandemic appointment has been pushed out.   Drinks plenty of water, denies flank pain nausea, vomiting, fevers, chills, foam in urine.  States her cologuard expired needed new one.   Anxiety and depression She takes Effexor 75mg  daily and this works well for her overall.  She does have a fear of flying and had taken Xanax prior to flights in the past.  Has tried hydroxyzine and BuSpar without any relief of symptoms.  PHQ2/9: Depression screen [facility] 2/9 09/27/2018 03/26/2018 02/09/2018 07/24/2017 01/24/2017  Decreased Interest 0 0 0 0 0  Down, Depressed, Hopeless 0 2 0 0 0  PHQ - 2 Score 0 2 0 0 0  Altered sleeping 0 0 0 - -  Tired, decreased energy 0 0 0 - -  Change in appetite 0 0 0 - -  Feeling bad or failure about yourself  0 0 0 - -  Trouble concentrating 0 0 0 - -  Moving slowly or fidgety/restless 0 0 0 - -  Suicidal thoughts 0 0 0 - -  PHQ-9 Score 0 2 0 - -  Difficult doing work/chores Not difficult at all Not difficult at all Not difficult at all - -    PHQ reviewed. Negative  Patient Active Problem List   Diagnosis Date Noted  . Abnormal Papanicolaou smear of cervix with positive human papilloma virus (HPV) test 03/27/2018  . Preventative health care 03/26/2018  . Obesity (BMI 30.0-34.9) 07/30/2017  . Anxiety 10/31/2015  . Medication monitoring encounter 10/30/2015  . Colon cancer screening 04/07/2015  . Breast cancer screening 04/07/2015  . Status post coronary artery stent placement 11/21/2014  . Hypertension   . Hyperlipidemia   . Panic attacks   .  Adrenal mass, left ([place])   . CAD (coronary artery disease)   . Premenstrual symptom   . Tobacco use   . Tricuspid valve regurgitation   . MVP (mitral valve prolapse)   . Left adrenal mass ([place]) 07/16/2013  . 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 Adrenal mass, left ([place])    followed by Endocrine, hormone testing q 5 years and CT q 1-2 years  . CAD (coronary artery disease)   . Depression   .  Hyperlipidemia   . Hypertension   . MVP (mitral valve prolapse)   . Obesity   . Panic attacks   . Plantar fascia syndrome   . Presence of stent in LAD coronary artery   . 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 Tricuspid valve regurgitation     Past Surgical History:  Procedure Laterality Date  . CORONARY ANGIOPLASTY WITH STENT PLACEMENT  2006  . ENDOMETRIAL ABLATION  2012  . HERNIA REPAIR     at 7 yrs of age  . TUBAL LIGATION  2012    Social History   Tobacco Use  . Smoking status: Current Every Day Smoker    Types: Cigarettes  . Smokeless tobacco: Former Systems developer  . Tobacco comment: vaper  Substance Use Topics  . Alcohol use: Yes    Alcohol/week: 0.0 standard drinks    Comment: Occasionally     Current Outpatient Medications:  .  amLODipine (NORVASC) 5 MG tablet, Take 1 tablet (5 mg total) by mouth daily., Disp: 90 tablet, Rfl: 3 .  aspirin 81 MG tablet, Take 81 mg by mouth daily., Disp: , Rfl:  .  hydrochlorothiazide (HYDRODIURIL) 25 MG tablet, Take 1 tablet (25 mg total) by mouth daily., Disp: 90 tablet, Rfl: 0 .  losartan (COZAAR) 100 MG tablet, Take 1 tablet (100 mg total) by mouth daily., Disp: 90 tablet, Rfl: 0 .  metoprolol succinate (TOPROL-XL) 50 MG 24 hr tablet, Take 1 tablet (50 mg total) by mouth daily. Take with or immediately following a meal., Disp: 90 tablet, Rfl: 1 .  rosuvastatin (CRESTOR) 40 MG tablet, Take 1 tablet (40 mg total) by mouth every morning., Disp: 90 tablet, Rfl: 1 .  ticagrelor (BRILINTA) 60 MG TABS tablet, Take 60 mg by mouth 2 (two) times daily., Disp: , Rfl:  .  venlafaxine XR (EFFEXOR-XR) 75 MG 24 hr capsule, Take 1 capsule (75 mg total) by mouth daily with breakfast., Disp: 90 capsule, Rfl: 1 .  busPIRone (BUSPAR) 15 MG tablet, Take 0.5 tablets (7.5 mg total) by mouth 2 (two) times daily as needed., Disp: 30 tablet, Rfl: 1  Allergies  Allergen Reactions  . Dilaudid [Hydromorphone Hcl] Nausea And Vomiting   . Hydromorphone Nausea Only and Nausea And Vomiting    ROS   No other specific complaints in a complete review of systems (except as listed in HPI above).  Objective  There were no vitals filed for this visit.   There is no height or weight on file to calculate BMI.  Nursing Note and Vital Signs reviewed.  Physical Exam Constitutional: Patient appears well-developed and well-nourished. No distress.  HENT: Head: Normocephalic and atraumatic. Pulmonary/Chest: Effort normal  Neurological: alert and oriented, speech normal.  Skin: No rash noted. No erythema.  Psychiatric: Patient has a normal mood and affect. behavior is normal. Judgment and thought content normal.    Assessment & Plan  1. Essential hypertension She has  been on the stable dose, asymptomatic we will continue.  Follow-up in 3 months - losartan (COZAAR) 100 MG tablet; Take 1 tablet (100 mg total) by mouth daily.  Dispense: 90 tablet; Refill: 1 - hydrochlorothiazide (HYDRODIURIL) 25 MG tablet; Take 1 tablet (25 mg total) by mouth daily.  Dispense: 90 tablet; Refill: 1 - amLODipine (NORVASC) 5 MG tablet; Take 1 tablet (5 mg total) by mouth daily.  Dispense: 90 tablet; Refill: 1 - metoprolol succinate (TOPROL-XL) 50 MG 24 hr tablet; Take 1 tablet (50 mg total) by mouth daily. Take with or immediately following a meal.  Dispense: 90 tablet; Refill: 1  2. Coronary artery disease due to lipid rich plaque Continue routine follow-up with cardiologist, continue Brilinta. - rosuvastatin (CRESTOR) 40 MG tablet; Take 1 tablet (40 mg total) by mouth every morning.  Dispense: 90 tablet; Refill: 1  3. Mixed hyperlipidemia Discussed diet, well controlled LDL. - rosuvastatin (CRESTOR) 40 MG tablet; Take 1 tablet (40 mg total) by mouth every morning.  Dispense: 90 tablet; Refill: 1  4. Colon cancer screening - Cologuard  5. Anxiety and depression Well-controlled on his medication, continue - venlafaxine XR (EFFEXOR-XR) 75  MG 24 hr capsule; Take 1 capsule (75 mg total) by mouth daily with breakfast.  Dispense: 90 capsule; Refill: 1  6. MVP (mitral valve prolapse) Stable  7. Panic attacks Due to fear of flying consider as needed anxiolytic   8. Left adrenal mass (HCC) Deemed benign no routine monitoring necessary  9. Microalbuminuria Follow-up with nephrology     Follow Up Instructions:   3 months I discussed the assessment and treatment plan with the patient. The patient was provided an opportunity to ask questions and all were answered. The patient agreed with the plan and demonstrated an understanding of the instructions.   The patient was advised to call back or seek an in-person evaluation if the symptoms worsen or if the condition fails to improve as anticipated.  I provided 27 minutes of non-face-to-face time during this encounter.   Fredderick Severance, NP

## 2018-10-17 ENCOUNTER — Ambulatory Visit: Payer: BLUE CROSS/BLUE SHIELD | Admitting: Obstetrics & Gynecology

## 2018-11-20 ENCOUNTER — Encounter: Payer: Self-pay | Admitting: Obstetrics & Gynecology

## 2018-11-20 ENCOUNTER — Ambulatory Visit (INDEPENDENT_AMBULATORY_CARE_PROVIDER_SITE_OTHER): Payer: 59 | Admitting: Obstetrics & Gynecology

## 2018-11-20 ENCOUNTER — Other Ambulatory Visit (HOSPITAL_COMMUNITY)
Admission: RE | Admit: 2018-11-20 | Discharge: 2018-11-20 | Disposition: A | Discharge status: Home / Self Care | Payer: 59 | Source: Ambulatory Visit | Attending: Obstetrics & Gynecology | Admitting: Obstetrics & Gynecology

## 2018-11-20 ENCOUNTER — Other Ambulatory Visit: Payer: Self-pay

## 2018-11-20 VITALS — BP 122/80 | Ht 65.0 in | Wt 188.0 lb

## 2018-11-20 DIAGNOSIS — N870 Mild cervical dysplasia: Secondary | ICD-10-CM | POA: Diagnosis not present

## 2018-11-20 DIAGNOSIS — R809 Proteinuria, unspecified: Secondary | ICD-10-CM | POA: Diagnosis not present

## 2018-11-20 DIAGNOSIS — I10 Essential (primary) hypertension: Secondary | ICD-10-CM | POA: Diagnosis not present

## 2018-11-20 DIAGNOSIS — R319 Hematuria, unspecified: Secondary | ICD-10-CM | POA: Diagnosis not present

## 2018-11-20 NOTE — Progress Notes (Signed)
  HPI:  Patient is a 58 y.o. No obstetric history on file. presenting for follow up evaluation of abnormal PAP smear in the past.  Her last PAP was 7 months ago and was abnormal: POS HPV. She has had a prior colposcopy. Prior biopsies (if done) were CIN I.  No prior abnormal PAP prior to this time.  History of genital warts in her 49s.  PMHx: She  has a past medical history of Adrenal mass, left ([place]), CAD (coronary artery disease), Depression, Hyperlipidemia, Hypertension, MVP (mitral valve prolapse), Obesity, Panic attacks, Plantar fascia syndrome, Presence of stent in LAD coronary artery, Recurrent and persistent hematuria (05/2013), and Tricuspid valve regurgitation. Also,  has a past surgical history that includes Hernia repair; Coronary angioplasty with stent (2006); Tubal ligation (2012); and Endometrial ablation (2012)., She was adopted. Family history is unknown by patient.,  reports that she has been smoking cigarettes. She has quit using smokeless tobacco. She reports current alcohol use. She reports that she does not use drugs.  She has a current medication list which includes the following prescription(s): amlodipine, aspirin, hydrochlorothiazide, losartan, metoprolol succinate, rosuvastatin, ticagrelor, and venlafaxine xr. Also, is allergic to dilaudid [hydromorphone hcl] and hydromorphone.  Review of Systems  All other systems reviewed and are negative.   Objective: BP 130/90   Ht 5\' 5"  (1.651 m)   Wt 188 lb (85.3 kg)   BMI 31.28 kg/m  Filed Weights   11/20/18 0846  Weight: 188 lb (85.3 kg)   Body mass index is 31.28 kg/m.  Physical examination Physical Exam Constitutional:      General: She is not in acute distress.    Appearance: She is well-developed.  Musculoskeletal: Normal range of motion.  Neurological:     Mental Status: She is alert and oriented to person, place, and time.  Skin:    General: Skin is warm and dry.  Vitals signs reviewed.     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  I discussed the grading system of pap smears and HPV high risk viral types.   2. Follow up PAP 6 months, vs intervention if high grade dysplasia identified. 3. Treatment of persistantly abnormal PAP smears and cervical dysplasia, even mild, is discussed w pt today in detail, as well as the pros and cons of Cryo and LEEP procedures. Will consider and discuss after results.   Barnett Applebaum, MD, [facility], [facility] 11/20/2018  8:50 AM

## 2018-11-20 NOTE — Patient Instructions (Signed)
Repeat BP 122/80

## 2018-11-21 DIAGNOSIS — Z9861 Coronary angioplasty status: Secondary | ICD-10-CM | POA: Diagnosis not present

## 2018-11-21 DIAGNOSIS — E782 Mixed hyperlipidemia: Secondary | ICD-10-CM | POA: Diagnosis not present

## 2018-11-21 DIAGNOSIS — R69 Illness, unspecified: Secondary | ICD-10-CM | POA: Diagnosis not present

## 2018-11-21 DIAGNOSIS — I2510 Atherosclerotic heart disease of native coronary artery without angina pectoris: Secondary | ICD-10-CM | POA: Diagnosis not present

## 2018-11-21 DIAGNOSIS — R0602 Shortness of breath: Secondary | ICD-10-CM | POA: Diagnosis not present

## 2018-11-21 DIAGNOSIS — I10 Essential (primary) hypertension: Secondary | ICD-10-CM | POA: Diagnosis not present

## 2018-11-23 LAB — CYTOLOGY - PAP: Diagnosis: NEGATIVE

## 2019-01-02 DIAGNOSIS — R809 Proteinuria, unspecified: Secondary | ICD-10-CM | POA: Diagnosis not present

## 2019-01-02 DIAGNOSIS — I10 Essential (primary) hypertension: Secondary | ICD-10-CM | POA: Diagnosis not present

## 2019-01-23 IMAGING — US US PELVIS COMPLETE
1 series · 14 of 25 positions shown · non-contrast
Comparison: None

CLINICAL DATA: Initial evaluation for acute right lower quadrant,
pelvic pain.



[Series 1: us pelvis complete · 0.17mm/px · 14 of 89 slices shown]
[im 1/89]
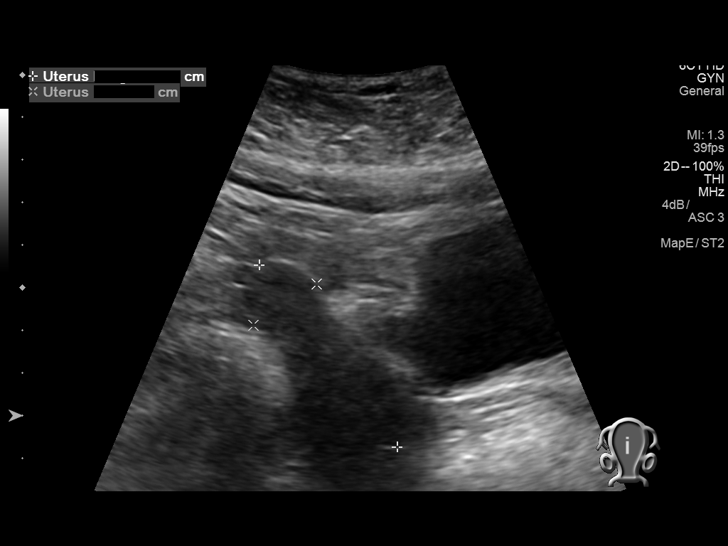
[im 8/89]
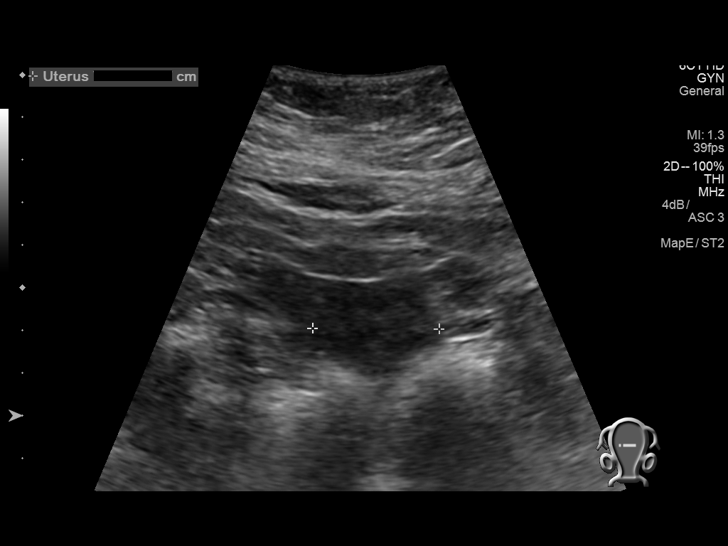
[im 15/89]
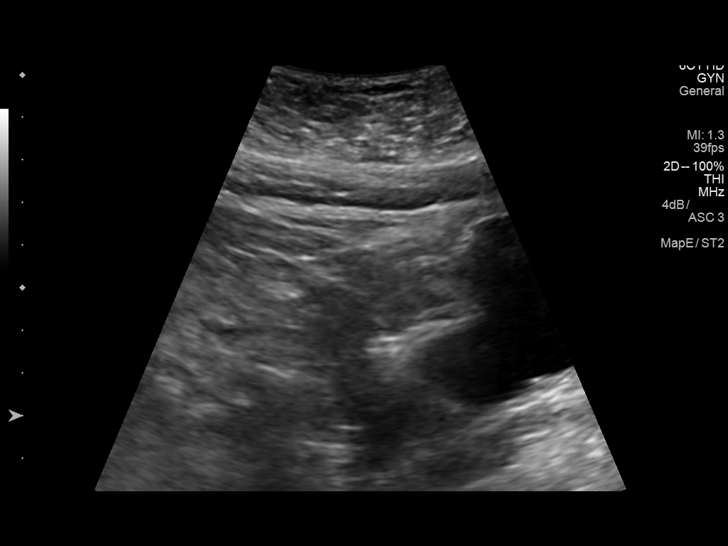
[im 23/89]
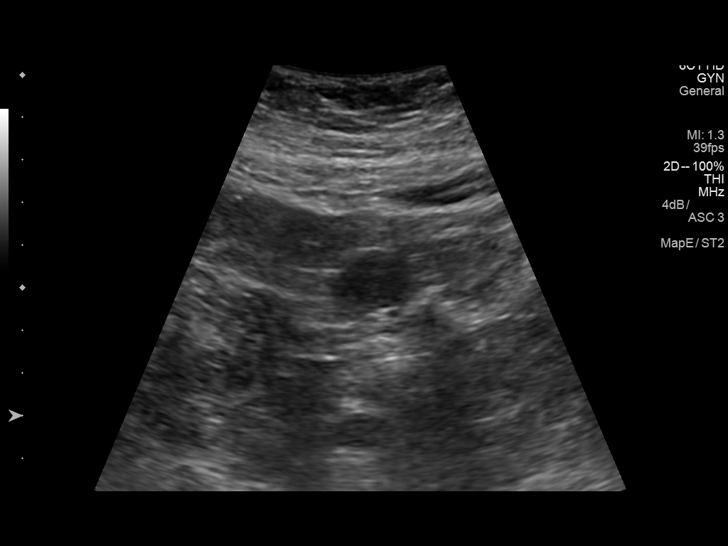
[im 30/89]
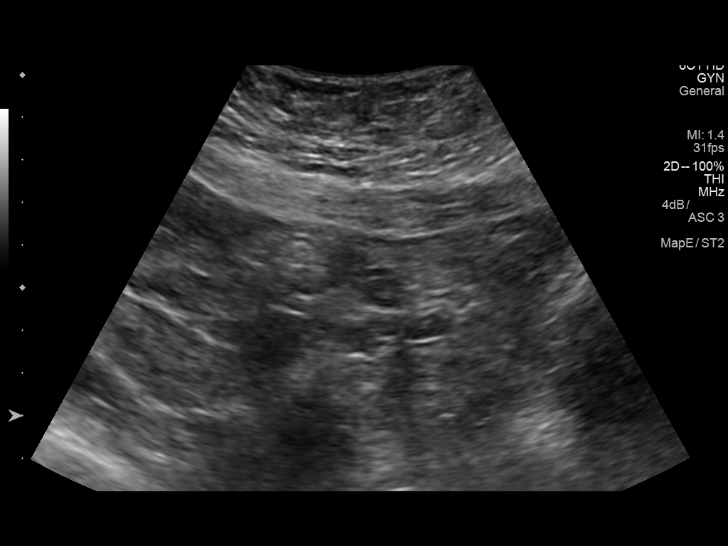
[im 34/89]
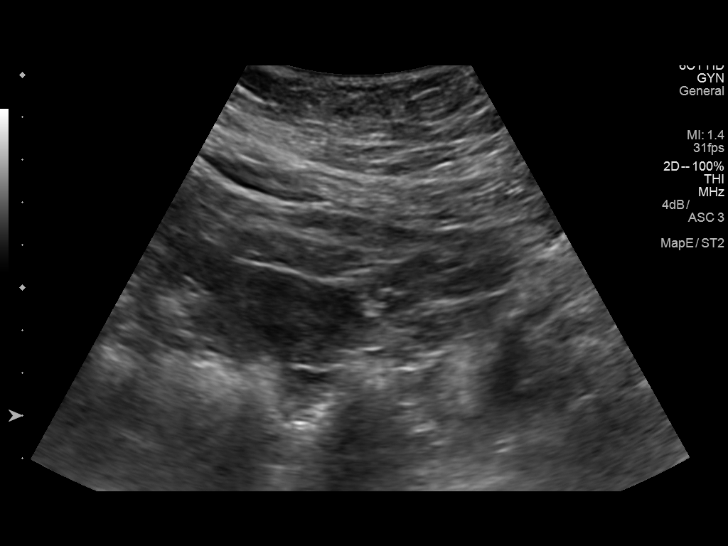
[im 41/89]
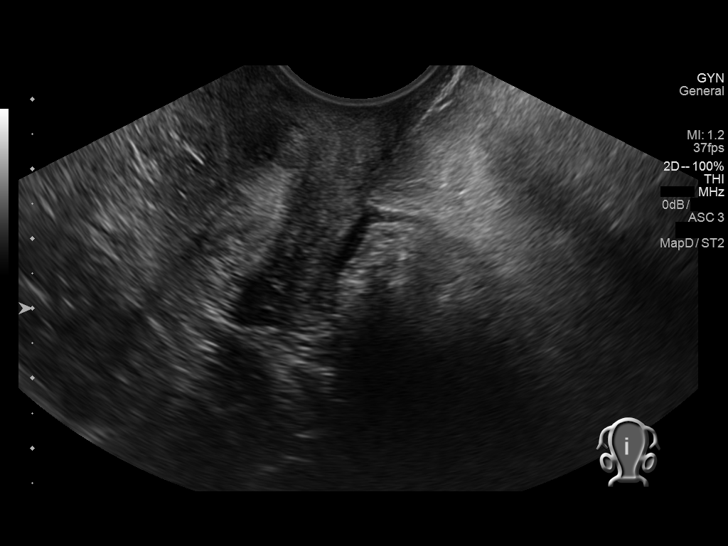
[im 48/89]
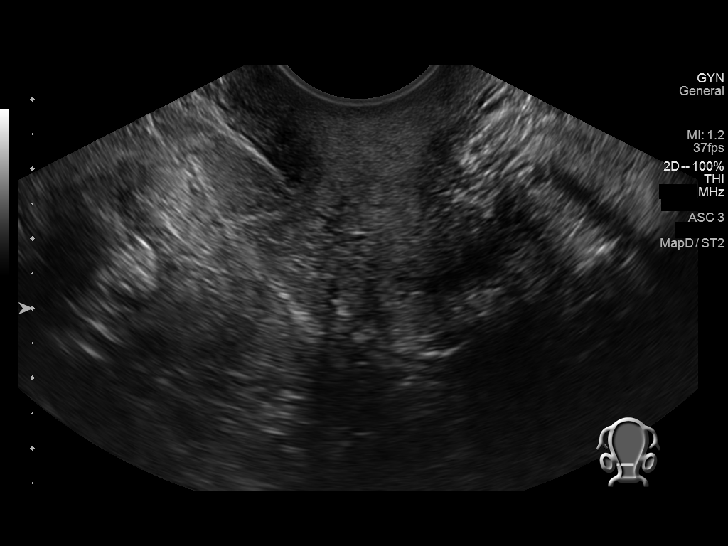
[im 56/89]
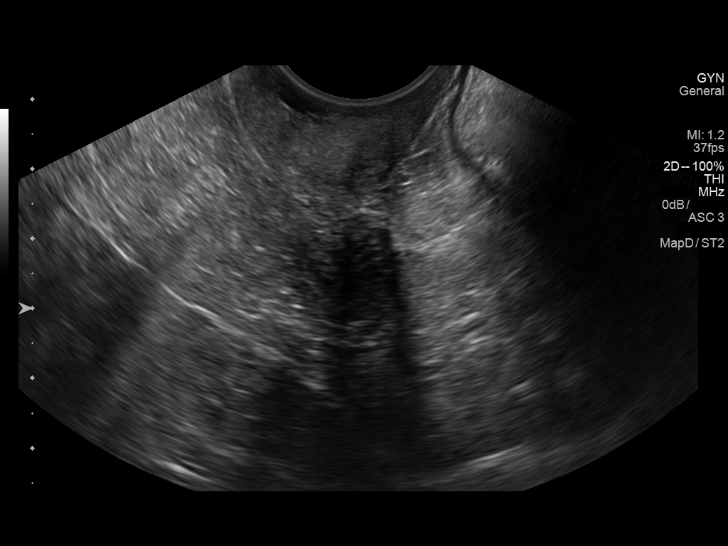
[im 59/89]
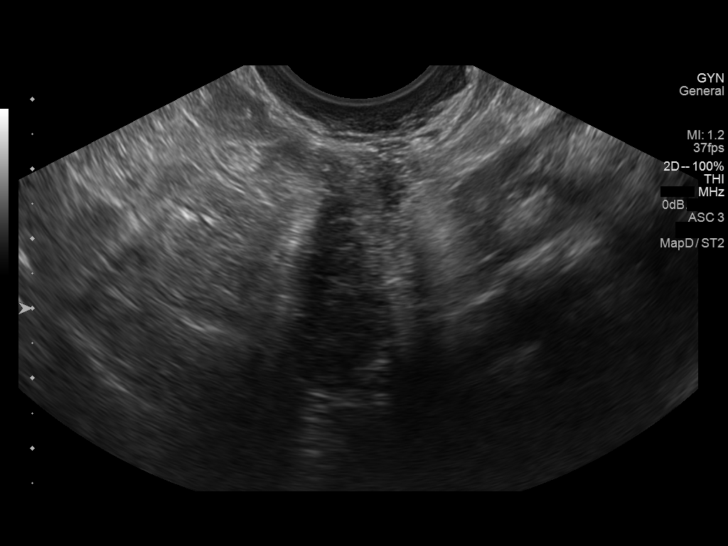
[im 67/89]
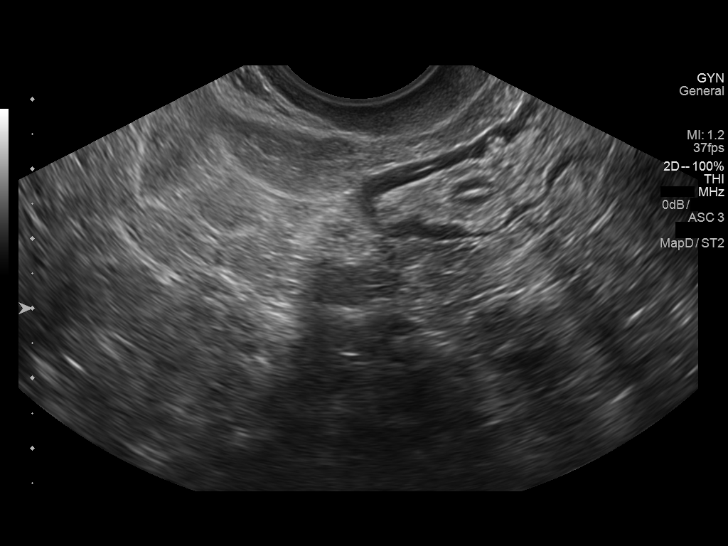
[im 74/89]
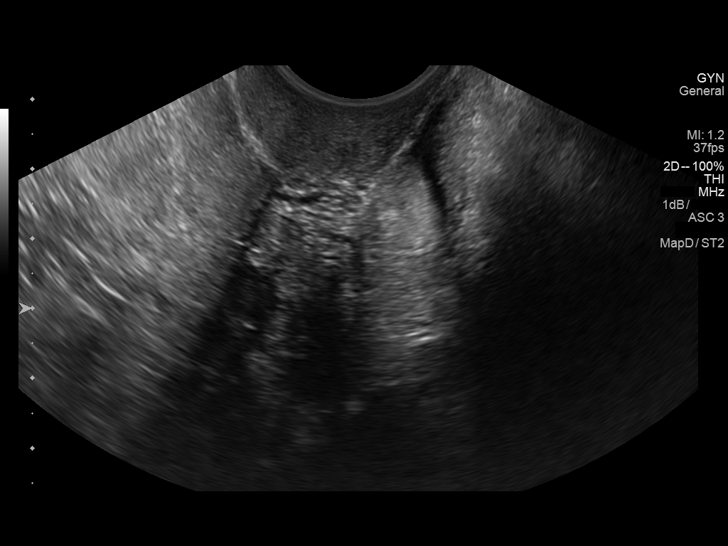
[im 81/89]
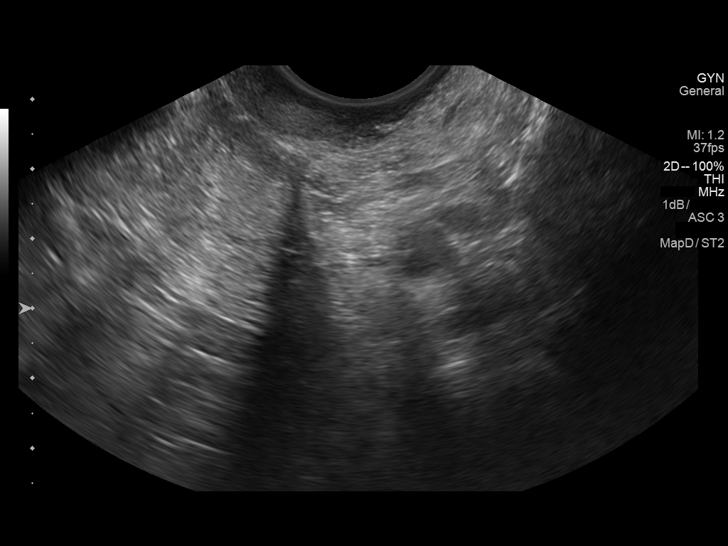
[im 89/89]
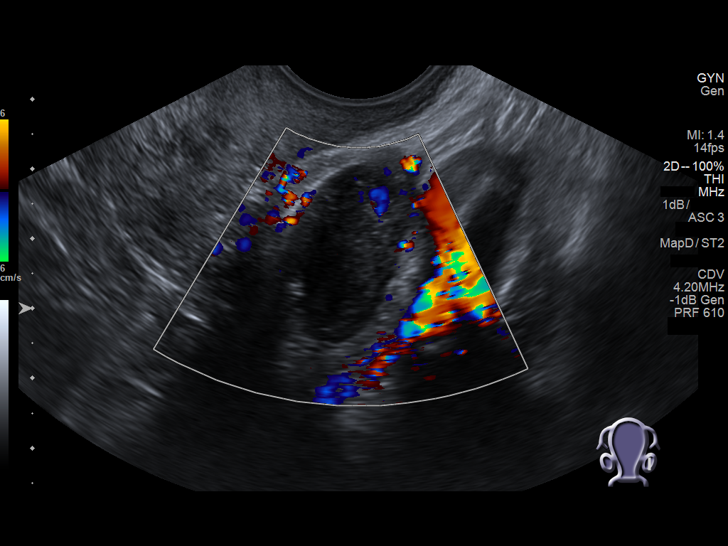

[14 of 25 positions shown; findings below may reference images not displayed]

FINDINGS: Uterus

Measurements: 3.5 x 1.9 x 3.3 cm. No fibroids or other mass
visualized.

Endometrium

Thickness: 3 mm.  No focal abnormality visualized.

Right ovary

Measurements: 2.9 x 1.4 x 1.5 cm. Normal appearance/no adnexal mass.

Left ovary

Measurements: 2.4 x 1.2 x 1.4 cm. Normal appearance/no adnexal mass.

Other findings

No abnormal free fluid.
IMPRESSION: Normal pelvic ultrasound. No pelvic or adnexal mass. No findings to
explain patient's symptoms identified.

## 2019-02-05 DIAGNOSIS — H5213 Myopia, bilateral: Secondary | ICD-10-CM | POA: Diagnosis not present

## 2019-03-29 ENCOUNTER — Encounter: Payer: BLUE CROSS/BLUE SHIELD | Admitting: Family Medicine

## 2019-05-31 DIAGNOSIS — E782 Mixed hyperlipidemia: Secondary | ICD-10-CM | POA: Diagnosis not present

## 2019-05-31 DIAGNOSIS — I2510 Atherosclerotic heart disease of native coronary artery without angina pectoris: Secondary | ICD-10-CM | POA: Diagnosis not present

## 2019-05-31 DIAGNOSIS — R0602 Shortness of breath: Secondary | ICD-10-CM | POA: Diagnosis not present

## 2019-05-31 DIAGNOSIS — Z9861 Coronary angioplasty status: Secondary | ICD-10-CM | POA: Diagnosis not present

## 2019-05-31 DIAGNOSIS — R69 Illness, unspecified: Secondary | ICD-10-CM | POA: Diagnosis not present

## 2019-05-31 DIAGNOSIS — I10 Essential (primary) hypertension: Secondary | ICD-10-CM | POA: Diagnosis not present

## 2019-06-14 DIAGNOSIS — R0602 Shortness of breath: Secondary | ICD-10-CM | POA: Diagnosis not present

## 2019-06-18 DIAGNOSIS — I351 Nonrheumatic aortic (valve) insufficiency: Secondary | ICD-10-CM | POA: Diagnosis not present

## 2019-06-18 DIAGNOSIS — Z1331 Encounter for screening for depression: Secondary | ICD-10-CM | POA: Diagnosis not present

## 2019-06-18 DIAGNOSIS — R5383 Other fatigue: Secondary | ICD-10-CM | POA: Diagnosis not present

## 2019-06-18 DIAGNOSIS — Z131 Encounter for screening for diabetes mellitus: Secondary | ICD-10-CM | POA: Diagnosis not present

## 2019-06-18 DIAGNOSIS — I340 Nonrheumatic mitral (valve) insufficiency: Secondary | ICD-10-CM | POA: Diagnosis not present

## 2019-06-18 DIAGNOSIS — D519 Vitamin B12 deficiency anemia, unspecified: Secondary | ICD-10-CM | POA: Diagnosis not present

## 2019-06-18 DIAGNOSIS — R0602 Shortness of breath: Secondary | ICD-10-CM | POA: Diagnosis not present

## 2019-06-18 DIAGNOSIS — R69 Illness, unspecified: Secondary | ICD-10-CM | POA: Diagnosis not present

## 2019-06-18 DIAGNOSIS — R635 Abnormal weight gain: Secondary | ICD-10-CM | POA: Diagnosis not present

## 2019-06-18 DIAGNOSIS — I2510 Atherosclerotic heart disease of native coronary artery without angina pectoris: Secondary | ICD-10-CM | POA: Diagnosis not present

## 2019-06-18 DIAGNOSIS — E782 Mixed hyperlipidemia: Secondary | ICD-10-CM | POA: Diagnosis not present

## 2019-06-18 DIAGNOSIS — I10 Essential (primary) hypertension: Secondary | ICD-10-CM | POA: Diagnosis not present

## 2019-06-24 ENCOUNTER — Other Ambulatory Visit: Payer: Self-pay

## 2019-06-24 DIAGNOSIS — I10 Essential (primary) hypertension: Secondary | ICD-10-CM

## 2019-06-24 MED ORDER — HYDROCHLOROTHIAZIDE 25 MG PO TABS: 25.0000 mg | ORAL_TABLET | Freq: Every day | ORAL | 0 refills | Status: DC

## 2019-06-24 NOTE — Telephone Encounter (Signed)
Hypertension medication request:09/27/18  Last office visit pertaining to hypertension:  BP Readings from Last 3 Encounters:  11/20/18 122/80  04/17/18 120/80  03/26/18 126/78    Lab Results  Component Value Date   CREATININE 0.99 06/08/2018   BUN 12 06/08/2018   NA 143 06/08/2018   K 4.1 06/08/2018   CL 103 06/08/2018   CO2 29 06/08/2018     No follow-ups on file.

## 2019-06-25 NOTE — Telephone Encounter (Signed)
Appt scheduled for 5.21.2021 for a complete physical and med refill

## 2019-07-01 DIAGNOSIS — M79672 Pain in left foot: Secondary | ICD-10-CM | POA: Diagnosis not present

## 2019-07-01 DIAGNOSIS — S93402A Sprain of unspecified ligament of left ankle, initial encounter: Secondary | ICD-10-CM | POA: Diagnosis not present

## 2019-07-10 DIAGNOSIS — R319 Hematuria, unspecified: Secondary | ICD-10-CM | POA: Insufficient documentation

## 2019-07-10 DIAGNOSIS — R809 Proteinuria, unspecified: Secondary | ICD-10-CM | POA: Insufficient documentation

## 2019-07-10 DIAGNOSIS — I10 Essential (primary) hypertension: Secondary | ICD-10-CM | POA: Insufficient documentation

## 2019-07-15 DIAGNOSIS — S93409D Sprain of unspecified ligament of unspecified ankle, subsequent encounter: Secondary | ICD-10-CM | POA: Diagnosis not present

## 2019-09-20 ENCOUNTER — Other Ambulatory Visit: Payer: Self-pay

## 2019-09-20 ENCOUNTER — Ambulatory Visit (INDEPENDENT_AMBULATORY_CARE_PROVIDER_SITE_OTHER): Payer: 59 | Admitting: Family Medicine

## 2019-09-20 ENCOUNTER — Encounter: Payer: Self-pay | Admitting: Family Medicine

## 2019-09-20 VITALS — BP 126/78 | HR 77 | Temp 97.9°F | Resp 14 | Ht 65.0 in | Wt 190.2 lb

## 2019-09-20 DIAGNOSIS — Z1231 Encounter for screening mammogram for malignant neoplasm of breast: Secondary | ICD-10-CM

## 2019-09-20 DIAGNOSIS — Z1211 Encounter for screening for malignant neoplasm of colon: Secondary | ICD-10-CM | POA: Diagnosis not present

## 2019-09-20 DIAGNOSIS — Z720 Tobacco use: Secondary | ICD-10-CM

## 2019-09-20 DIAGNOSIS — I10 Essential (primary) hypertension: Secondary | ICD-10-CM | POA: Diagnosis not present

## 2019-09-20 DIAGNOSIS — Z780 Asymptomatic menopausal state: Secondary | ICD-10-CM

## 2019-09-20 DIAGNOSIS — Z23 Encounter for immunization: Secondary | ICD-10-CM

## 2019-09-20 DIAGNOSIS — F329 Major depressive disorder, single episode, unspecified: Secondary | ICD-10-CM

## 2019-09-20 DIAGNOSIS — Z0000 Encounter for general adult medical examination without abnormal findings: Secondary | ICD-10-CM | POA: Diagnosis not present

## 2019-09-20 DIAGNOSIS — Z6831 Body mass index (BMI) 31.0-31.9, adult: Secondary | ICD-10-CM

## 2019-09-20 DIAGNOSIS — R809 Proteinuria, unspecified: Secondary | ICD-10-CM | POA: Diagnosis not present

## 2019-09-20 DIAGNOSIS — R69 Illness, unspecified: Secondary | ICD-10-CM | POA: Diagnosis not present

## 2019-09-20 DIAGNOSIS — I2510 Atherosclerotic heart disease of native coronary artery without angina pectoris: Secondary | ICD-10-CM

## 2019-09-20 DIAGNOSIS — Z5181 Encounter for therapeutic drug level monitoring: Secondary | ICD-10-CM

## 2019-09-20 DIAGNOSIS — F32A Depression, unspecified: Secondary | ICD-10-CM

## 2019-09-20 DIAGNOSIS — E782 Mixed hyperlipidemia: Secondary | ICD-10-CM | POA: Diagnosis not present

## 2019-09-20 DIAGNOSIS — E669 Obesity, unspecified: Secondary | ICD-10-CM

## 2019-09-20 DIAGNOSIS — F419 Anxiety disorder, unspecified: Secondary | ICD-10-CM

## 2019-09-20 MED ORDER — ROSUVASTATIN CALCIUM 40 MG PO TABS: 40.0000 mg | ORAL_TABLET | ORAL | 3 refills | Status: DC

## 2019-09-20 MED ORDER — AMLODIPINE BESYLATE 5 MG PO TABS: 5.0000 mg | ORAL_TABLET | Freq: Every day | ORAL | 3 refills | Status: DC

## 2019-09-20 MED ORDER — METOPROLOL SUCCINATE ER 50 MG PO TB24: 50.0000 mg | ORAL_TABLET | Freq: Every day | ORAL | 3 refills | Status: DC

## 2019-09-20 MED ORDER — LOSARTAN POTASSIUM 100 MG PO TABS: 100.0000 mg | ORAL_TABLET | Freq: Every day | ORAL | 3 refills | Status: DC

## 2019-09-20 MED ORDER — VENLAFAXINE HCL ER 75 MG PO CP24: 75.0000 mg | ORAL_CAPSULE | Freq: Every day | ORAL | 3 refills | Status: DC

## 2019-09-20 MED ORDER — ZOSTER VAC RECOMB ADJUVANTED 50 MCG/0.5ML IM SUSR: 0.5000 mL | Freq: Once | INTRAMUSCULAR | 1 refills | Status: AC

## 2019-09-20 NOTE — Patient Instructions (Signed)
Preventive Care 50-57 Years Old, Female Preventive care refers to visits with your health care provider and lifestyle choices that can promote health and wellness. This includes:  A yearly physical exam. This may also be called an annual well check.  Regular dental visits and eye exams.  Immunizations.  Screening for certain conditions.  Healthy lifestyle choices, such as eating a healthy diet, getting regular exercise, not using drugs or products that contain nicotine and tobacco, and limiting alcohol use. What can I expect for my preventive care visit? Physical exam Your health care provider will check your:  Height and weight. This may be used to calculate body mass index (BMI), which tells if you are at a healthy weight.  Heart rate and blood pressure.  Skin for abnormal spots. Counseling Your health care provider may ask you questions about your:  Alcohol, tobacco, and drug use.  Emotional well-being.  Home and relationship well-being.  Sexual activity.  Eating habits.  Work and work Statistician.  Method of birth control.  Menstrual cycle.  Pregnancy history. What immunizations do I need?  Influenza (flu) vaccine  This is recommended every year. Tetanus, diphtheria, and pertussis (Tdap) vaccine  You may need a Td booster every 10 years. Varicella (chickenpox) vaccine  You may need this if you have not been vaccinated. Zoster (shingles) vaccine  You may need this after age 20. Measles, mumps, and rubella (MMR) vaccine  You may need at least one dose of MMR if you were born in 1957 or later. You may also need a second dose. Pneumococcal conjugate (PCV13) vaccine  You may need this if you have certain conditions and were not previously vaccinated. Pneumococcal polysaccharide (PPSV23) vaccine  You may need one or two doses if you smoke cigarettes or if you have certain conditions. Meningococcal conjugate (MenACWY) vaccine  You may need this if you  have certain conditions. Hepatitis A vaccine  You may need this if you have certain conditions or if you travel or work in places where you may be exposed to hepatitis A. Hepatitis B vaccine  You may need this if you have certain conditions or if you travel or work in places where you may be exposed to hepatitis B. Haemophilus influenzae type b (Hib) vaccine  You may need this if you have certain conditions. Human papillomavirus (HPV) vaccine  If recommended by your health care provider, you may need three doses over 6 months. You may receive vaccines as individual doses or as more than one vaccine together in one shot (combination vaccines). Talk with your health care provider about the risks and benefits of combination vaccines. What tests do I need? Blood tests  Lipid and cholesterol levels. These may be checked every 5 years, or more frequently if you are over 77 years old.  Hepatitis C test.  Hepatitis B test. Screening  Lung cancer screening. You may have this screening every year starting at age 74 if you have a 30-pack-year history of smoking and currently smoke or have quit within the past 15 years.  Colorectal cancer screening. All adults should have this screening starting at age 60 and continuing until age 77. Your health care provider may recommend screening at age 66 if you are at increased risk. You will have tests every 1-10 years, depending on your results and the type of screening test.  Diabetes screening. This is done by checking your blood sugar (glucose) after you have not eaten for a while (fasting). You may have this  done every 1-3 years.  Mammogram. This may be done every 1-2 years. Talk with your health care provider about when you should start having regular mammograms. This may depend on whether you have a family history of breast cancer.  BRCA-related cancer screening. This may be done if you have a family history of breast, ovarian, tubal, or peritoneal  cancers.  Pelvic exam and Pap test. This may be done every 3 years starting at age 4. Starting at age 73, this may be done every 5 years if you have a Pap test in combination with an HPV test. Other tests  Sexually transmitted disease (STD) testing.  Bone density scan. This is done to screen for osteoporosis. You may have this scan if you are at high risk for osteoporosis. Follow these instructions at home: Eating and drinking  Eat a diet that includes fresh fruits and vegetables, whole grains, lean protein, and low-fat dairy.  Take vitamin and mineral supplements as recommended by your health care provider.  Do not drink alcohol if: ? Your health care provider tells you not to drink. ? You are pregnant, may be pregnant, or are planning to become pregnant.  If you drink alcohol: ? Limit how much you have to 0-1 drink a day. ? Be aware of how much alcohol is in your drink. In the [place] one drink equals one 12 oz bottle of beer (355 mL), one 5 oz glass of wine (148 mL), or one 1 oz glass of hard liquor (44 mL). Lifestyle  Take daily care of your teeth and gums.  Stay active. Exercise for at least 30 minutes on 5 or more days each week.  Do not use any products that contain nicotine or tobacco, such as cigarettes, e-cigarettes, and chewing tobacco. If you need help quitting, ask your health care provider.  If you are sexually active, practice safe sex. Use a condom or other form of birth control (contraception) in order to prevent pregnancy and STIs (sexually transmitted infections).  If told by your health care provider, take low-dose aspirin daily starting at age 98. What's next?  Visit your health care provider once a year for a well check visit.  Ask your health care provider how often you should have your eyes and teeth checked.  Stay up to date on all vaccines. This information is not intended to replace advice given to you by your health care provider. Make sure you  discuss any questions you have with your health care provider. Document Revised: 12/28/2017 Document Reviewed: 12/28/2017 Elsevier Patient Education  2020 [place].   Preventing Osteoporosis, Adult Osteoporosis is a condition that causes the bones to lose density. This means that the bones become thinner, and the normal spaces in bone tissue become larger. Low bone density can make the bones weak and cause them to break more easily. Osteoporosis cannot always be prevented, but you can take steps to lower your risk of developing this condition. How can this condition affect me? If you develop osteoporosis, you will be more likely to break bones in your wrist, spine, or hip. Even a minor accident or injury can be enough to break weak bones. The bones will also be slower to heal. Osteoporosis can cause other problems as well, such as a stooped posture or trouble with movement. Osteoporosis can occur with aging. As you get older, you may lose bone tissue more quickly, or it may be replaced more slowly. Osteoporosis is more likely to develop if you have  poor nutrition or do not get enough calcium or vitamin D. Other lifestyle factors can also play a role. By eating a well-balanced diet and making lifestyle changes, you can help keep your bones strong and healthy, lowering your chances of developing osteoporosis. What can increase my risk? The following factors may make you more likely to develop osteoporosis:  Having a family history of the condition.  Having poor nutrition or not getting enough calcium or vitamin D.  Using certain medicines, such as steroid medicines or antiseizure medicines.  Being any of the following: ? 50 years of age or older. ? Female. ? A woman who has gone through menopause (is postmenopausal). ? White (Caucasian) or of Asian descent.  Smoking or having a history of smoking.  Not being physically active (being sedentary).  Having a small body frame. What  actions can I take to prevent this?  Get enough calcium   Make sure you get enough calcium every day. Calcium is the most important mineral for bone health. Most people can get enough calcium from their diet, but supplements may be recommended for people who are at risk for osteoporosis. Follow these guidelines: ? If you are age 50 or younger, aim to get 1,000 mg of calcium every day. ? If you are older than age 50, aim to get 1,200 mg of calcium every day.  Good sources of calcium include: ? Dairy products, such as low-fat or nonfat milk, cheese, and yogurt. ? Dark green leafy vegetables, such as bok choy and broccoli. ? Foods that have had calcium added to them (calcium-fortified foods), such as orange juice, cereal, bread, soy beverages, and tofu products. ? Nuts, such as almonds.  Check nutrition labels to see how much calcium is in a food or drink. Get enough vitamin D  Try to get enough vitamin D every day. Vitamin D is the most essential vitamin for bone health. It helps the body absorb calcium. Follow these guidelines for how much vitamin D to get from food: ? If you are age 57 or younger, aim to get at least 600 international units (IU) every day. Your health care provider may suggest more. ? If you are older than age 57, aim to get at least 800 international units every day. Your health care provider may suggest more.  Good sources of vitamin D in your diet include: ? Egg yolks. ? Oily fish, such as salmon, sardines, and tuna. ? Milk and cereal fortified with vitamin D.  Your body also makes vitamin D when you are out in the sun. Exposing the bare skin on your face, arms, legs, or back to the sun for no more than 30 minutes a day, 2 times a week is more than enough. Beyond that, make sure you use sunblock to protect your skin from sunburn, which increases your risk for skin cancer. Exercise  Stay active and get exercise every day.  Ask your health care provider what types of  exercise are best for you. Weight-bearing and strength-building activities are important for building and maintaining healthy bones. Some examples of these types of activities include: ? Walking and hiking. ? Jogging and running. ? Dancing. ? Gym exercises. ? Lifting weights. ? Tennis and racquetball. ? Climbing stairs. ? Aerobics. Make other lifestyle changes  Do not use any products that contain nicotine or tobacco, such as cigarettes, e-cigarettes, and chewing tobacco. If you need help quitting, ask your health care provider.  Lose weight if you are overweight.    If you drink alcohol: ? Limit how much you use to:  0-1 drink a day for nonpregnant women.  0-2 drinks a day for men. ? Be aware of how much alcohol is in your drink. In the [place] one drink equals one 12 oz bottle of beer (355 mL), one 5 oz glass of wine (148 mL), or one 1 oz glass of hard liquor (44 mL). Where to find support If you need help making changes to prevent osteoporosis, talk with your health care provider. You can ask for a referral to a diet and nutrition specialist (dietitian) and a physical therapist. Where to find more information Learn more about osteoporosis from:  NIH Osteoporosis and Related Bone Diseases National Resource Center: www.bones.nih.gov  [place] Office on Women's Health: www.womenshealth.gov  National Osteoporosis Foundation: www.nof.org Summary  Osteoporosis is a condition that causes weak bones that are more likely to break.  Eat a healthy diet, making sure you get enough calcium and vitamin D, and stay active by getting regular exercise to help prevent osteoporosis.  Other ways to reduce your risk of osteoporosis include maintaining a healthy weight and avoiding alcohol and products that contain nicotine or tobacco. This information is not intended to replace advice given to you by your health care provider. Make sure you discuss any questions you have with your health care  provider. Document Revised: 11/16/2018 Document Reviewed: 11/16/2018 Elsevier Patient Education  2020 [employer]  

## 2019-09-20 NOTE — Progress Notes (Signed)
Patient: Maria Franco, Female    DOB: 12/18/1960, 59 y.o.   MRN: [id number] Arnetha Courser, MD Visit Date: 09/20/2019  Today's Provider: Delsa Grana, PA-C   Chief Complaint  Patient presents with  . Annual Exam   Subjective:   Annual physical exam:  Maria Franco is a 59 y.o. female who presents today for complete physical exam:  Not seen for a year - asking for med refills, due for routine f/up on multiple chronic conditions: Overall tries to be very healthy and holistic, continues to smoke  Hypertension Patient is on losartan 144m, amlodipine 542m metoprolol 5066m not sure if she is on HCTZ or chlorlathidone  BP Readings from Last 3 Encounters:  09/20/19 126/78  11/20/18 122/80  04/17/18 120/80  No lightheadedness, hypotension, syncope. Blood pressure today is controlled. Pt denies CP, SOB, exertional sx, LE edema, palpitation, Ha's, visual disturbances Dietary efforts for BP?  Attempts to do low salt  Coronary artery disease Patient sees cardiologist Dr. KhaHumphrey Rolls Last stress test in the past 2 months  She is taking Brilinta and 81 mg aspirin - still on and doing well Is status post PCI of the LAD in 2006. Denies excessive bruising or bleeding, no blood in stools.   Hyperlipidemia Patient rx crestor [id number] medications as prescribed with few missed doses a month.  She does have coronary artery disease. Last Lipids: Lab Results  Component Value Date   CHOL 121 03/26/2018   HDL 58 03/26/2018   LDLCALC 44 03/26/2018   TRIG 105 03/26/2018   CHOLHDL 2.1 03/26/2018  - Denies: Chest pain, shortness of breath, myalgias. - Documented aortic atherosclerosis? No - Risk factors for atherosclerosis: diabetes mellitus, hypercholesterolemia and hypertension  Adrenal mass Large work up in the past and rechecked recently released from endocrinology/specialist, no further work up  Proteinuria PCP noted microalbuminemia and proteinuria.  Renal ultrasound was normal.   Kidney function preserved.  She was referred to nephrology states due to pandemic appointment has been pushed out.  Drinks plenty of water, denies flank pain nausea, vomiting, fevers, chills, foam in urine.  Anxiety and depression She takes Effexor 75mg52mly and this works well for her overall.  She does have a fear of flying and had taken Xanax prior to flights in the past.  Has tried hydroxyzine and BuSpar without any relief of symptoms.  USPSTF grade A and B recommendations - reviewed and addressed today  Depression:  Phq 9 completed today by patient, was reviewed by me with patient in the room PHQ score is neg, pt feels good PHQ 2/9 Scores 09/20/2019 09/27/2018 03/26/2018 02/09/2018  PHQ - 2 Score 0 0 2 0  PHQ- 9 Score 0 0 2 0   Depression screen PHQ [facility] 09/27/2018 03/26/2018 02/09/2018 07/24/2017  Decreased Interest 0 0 0 0 0  Down, Depressed, Hopeless 0 0 2 0 0  PHQ - 2 Score 0 0 2 0 0  Altered sleeping 0 0 0 0 -  Tired, decreased energy 0 0 0 0 -  Change in appetite 0 0 0 0 -  Feeling bad or failure about yourself  0 0 0 0 -  Trouble concentrating 0 0 0 0 -  Moving slowly or fidgety/restless 0 0 0 0 -  Suicidal thoughts 0 0 0 0 -  PHQ-9 Score 0 0 2 0 -  Difficult doing work/chores Not difficult at all Not difficult at all Not difficult at all Not difficult  at all -    Alcohol screening:   [facility] Visit from 09/20/2019 in [facility]  AUDIT-C Score  4      Immunizations and Health Maintenance: Health Maintenance  Topic Date Due  . COLONOSCOPY  Never done  . MAMMOGRAM  11/19/2013  . COVID-19 Vaccine (1) 10/17/2019 (Originally 10/21/1974)  . Hepatitis C Screening  09/19/2020 (Originally February 08, 1961)  . HIV Screening  09/19/2020 (Originally 10/20/1977)  . INFLUENZA VACCINE  12/01/2019  . PAP SMEAR-Modifier  11/19/2021  . TETANUS/TDAP  11/16/2023     Hep C Screening: not indicated  STD testing and prevention (HIV/chl/gon/syphilis):  see  above, no additional testing desired by pt today-  No  Intimate partner violence:  Feels safe  Sexual History/Pain during Intercourse: Married  Menstrual History/LMP/Abnormal Bleeding: denies No LMP recorded. Patient is postmenopausal.  Incontinence Symptoms:  rarely  Breast cancer: due for mammogram - ordered Last Mammogram: *see HM list above BRCA gene screening: none  Cervical cancer screening: UTD Adopted Pt known family hx of cancers - breast, ovarian, uterine, colon  Osteoporosis:   Discussion on osteoporosis per age, including high calcium and vitamin D supplementation, weight bearing exercises Pt is supplementing with daily calcium/Vit D. Bone scan/dexa  Done a few years ago Roughly experienced menopause at age 33's  Skin cancer:  Hx of skin CA -  NO Discussed atypical lesions   Colorectal cancer:   Colonoscopy is due - referred to GI Dr. Allen Norris Discussed concerning signs and sx of CRC, pt denies melena hematochezia   Lung cancer:   Low Dose CT Chest recommended if Age 24-80 years, 30 pack-year currently smoking OR have quit w/in 15years. Patient does qualify. She is not interested in doing tx if positive  Social History   Tobacco Use  . Smoking status: Current Every Day Smoker    Packs/day: 1.00    Years: 30.00    Pack years: 30.00    Types: Cigarettes  . Smokeless tobacco: Former Systems developer  . Tobacco comment: 39 years  Substance Use Topics  . Alcohol use: Yes    Alcohol/week: 0.0 standard drinks    Comment: Occasionally  . Drug use: No       Office Visit from 09/20/2019 in [facility]  AUDIT-C Score  4      Family History  Adopted: Yes  Family history unknown: Yes     Blood pressure/Hypertension: BP Readings from Last 3 Encounters:  09/20/19 126/78  11/20/18 122/80  04/17/18 120/80    Weight/Obesity: Wt Readings from Last 3 Encounters:  09/20/19 190 lb 3.2 oz (86.3 kg)  11/20/18 188 lb (85.3 kg)  04/17/18 188 lb (85.3 kg)     BMI Readings from Last 3 Encounters:  09/20/19 31.65 kg/m  11/20/18 31.28 kg/m  04/17/18 30.81 kg/m     Lipids:  Lab Results  Component Value Date   CHOL 121 03/26/2018   CHOL 104 08/14/2017   CHOL 109 06/27/2016   Lab Results  Component Value Date   HDL 58 03/26/2018   HDL 50 (L) 08/14/2017   HDL 44 (L) 06/27/2016   Lab Results  Component Value Date   LDLCALC 44 03/26/2018   LDLCALC 34 08/14/2017   LDLCALC 42 06/27/2016   Lab Results  Component Value Date   TRIG 105 03/26/2018   TRIG 121 08/14/2017   TRIG 117 06/27/2016   Lab Results  Component Value Date   CHOLHDL 2.1 03/26/2018   CHOLHDL 2.1 08/14/2017  CHOLHDL 2.5 06/27/2016   No results found for: LDLDIRECT Based on the results of lipid panel his/her cardiovascular risk factor ( using [facility] )  in the next 10 years is: The ASCVD Risk score Mikey Bussing DC Jr., et al., 2013) failed to calculate for the following reasons:   The valid total cholesterol range is 130 to 320 mg/dL Glucose:  Glucose, Bld  Date Value Ref Range Status  06/08/2018 101 (H) 65 - 99 mg/dL Final    Comment:    .            Fasting reference interval . For someone without known diabetes, a glucose value between 100 and 125 mg/dL is consistent with prediabetes and should be confirmed with a follow-up test. .   03/26/2018 81 65 - 139 mg/dL Final    Comment:    .        Non-fasting reference interval .   08/14/2017 92 65 - 99 mg/dL Final    Comment:    .            Fasting reference interval .    Hypertension: BP Readings from Last 3 Encounters:  09/20/19 126/78  11/20/18 122/80  04/17/18 120/80   Obesity: Wt Readings from Last 3 Encounters:  09/20/19 190 lb 3.2 oz (86.3 kg)  11/20/18 188 lb (85.3 kg)  04/17/18 188 lb (85.3 kg)   BMI Readings from Last 3 Encounters:  09/20/19 31.65 kg/m  11/20/18 31.28 kg/m  04/17/18 30.81 kg/m      Advanced Care Planning:  A voluntary discussion about advance care  planning including the explanation and discussion of advance directives.   Discussed health care proxy and Living will, and the patient was able to identify a health care proxy her husband Richardson Landry Sherren Mocha ) Mayer Masker   Patient does not have a living will at present time.  Given a ACP packet  Social History      She        Social History   Socioeconomic History  . Marital status: Married    Spouse name: Steven"todd"  . Number of children: Not on file  . Years of education: 93  . Highest education level: Bachelor's degree (e.g., BA, AB, BS)  Occupational History  . Not on file  Tobacco Use  . Smoking status: Current Every Day Smoker    Types: Cigarettes  . Smokeless tobacco: Former Systems developer  . Tobacco comment: vaper  Substance and Sexual Activity  . Alcohol use: Yes    Alcohol/week: 0.0 standard drinks    Comment: Occasionally  . Drug use: No  . Sexual activity: Yes  Other Topics Concern  . Not on file  Social History Narrative  . Not on file   Social Determinants of Health   Financial Resource Strain:   . Difficulty of Paying Living Expenses:   Food Insecurity:   . Worried About Charity fundraiser in the Last Year:   . Arboriculturist in the Last Year:   Transportation Needs:   . Film/video editor (Medical):   Marland Kitchen Lack of Transportation (Non-Medical):   Physical Activity:   . Days of Exercise per Week:   . Minutes of Exercise per Session:   Stress:   . Feeling of Stress :   Social Connections:   . Frequency of Communication with Friends and Family:   . Frequency of Social Gatherings with Friends and Family:   . Attends Religious Services:   . Active Member  of Clubs or Organizations:   . Attends Archivist Meetings:   Marland Kitchen Marital Status:     Family History        Family History  Adopted: Yes  Family history unknown: Yes    Patient Active Problem List   Diagnosis Date Noted  . CIN I (cervical intraepithelial neoplasia I) 11/20/2018  . Abnormal  Papanicolaou smear of cervix with positive human papilloma virus (HPV) test 03/27/2018  . Obesity (BMI 30.0-34.9) 07/30/2017  . Anxiety 10/31/2015  . Status post coronary artery stent placement 11/21/2014  . Hypertension   . Hyperlipidemia   . Panic attacks   . CAD (coronary artery disease)   . Tobacco use   . Tricuspid valve regurgitation   . MVP (mitral valve prolapse)   . Left adrenal mass ([place]) 07/16/2013  . Recurrent and persistent hematuria 05/02/2013    Past Surgical History:  Procedure Laterality Date  . CORONARY ANGIOPLASTY WITH STENT PLACEMENT  2006  . ENDOMETRIAL ABLATION  2012  . HERNIA REPAIR     at 7 yrs of age  . TUBAL LIGATION  2012     Current Outpatient Medications:  .  amLODipine (NORVASC) 5 MG tablet, Take 1 tablet (5 mg total) by mouth daily., Disp: 90 tablet, Rfl: 3 .  aspirin 81 MG tablet, Take 81 mg by mouth daily., Disp: , Rfl:  .  chlorthalidone (HYGROTON) 25 MG tablet, Take 25 mg by mouth daily., Disp: , Rfl:  .  hydrochlorothiazide (HYDRODIURIL) 25 MG tablet, Take 1 tablet (25 mg total) by mouth daily., Disp: 90 tablet, Rfl: 0 .  losartan (COZAAR) 100 MG tablet, Take 1 tablet (100 mg total) by mouth daily., Disp: 90 tablet, Rfl: 3 .  metoprolol succinate (TOPROL-XL) 50 MG 24 hr tablet, Take 1 tablet (50 mg total) by mouth daily. Take with or immediately following a meal., Disp: 90 tablet, Rfl: 3 .  rosuvastatin (CRESTOR) 40 MG tablet, Take 1 tablet (40 mg total) by mouth every morning., Disp: 90 tablet, Rfl: 3 .  ticagrelor (BRILINTA) 60 MG TABS tablet, Take 60 mg by mouth 2 (two) times daily., Disp: , Rfl:  .  venlafaxine XR (EFFEXOR-XR) 75 MG 24 hr capsule, Take 1 capsule (75 mg total) by mouth daily with breakfast., Disp: 90 capsule, Rfl: 3  Allergies  Allergen Reactions  . Dilaudid [Hydromorphone Hcl] Nausea And Vomiting  . Hydromorphone Nausea Only and Nausea And Vomiting    Patient Care Team: Arnetha Courser, MD as PCP - General (Family  Medicine) Dionisio David, MD as Consulting Physician (Cardiology) Philemon Kingdom, MD as Consulting Physician (Endocrinology) Collier Flowers, MD as Referring Physician (Urology)  Review of Systems  10 Systems reviewed and are negative for acute change except as noted in the HPI.  I personally reviewed active problem list, medication list, allergies, family history, social history, health maintenance, notes from last encounter, lab results, imaging with the patient/caregiver today.        Objective:   Vitals:  Vitals:   09/20/19 0819  BP: 126/78  Pulse: 77  Resp: 14  Temp: 97.9 F (36.6 C)  SpO2: 98%  Weight: 190 lb 3.2 oz (86.3 kg)  Height: '5\' 5"'  (1.651 m)    Body mass index is 31.65 kg/m.  Physical Exam Vitals and nursing note reviewed.  Constitutional:      General: She is not in acute distress.    Appearance: Normal appearance. She is well-developed. She is not ill-appearing, toxic-appearing or  diaphoretic.  HENT:     Head: Normocephalic and atraumatic.     Nose: Nose normal.  Eyes:     General:        Right eye: No discharge.        Left eye: No discharge.     Conjunctiva/sclera: Conjunctivae normal.  Neck:     Trachea: No tracheal deviation.  Cardiovascular:     Rate and Rhythm: Normal rate and regular rhythm.  Pulmonary:     Effort: Pulmonary effort is normal. No respiratory distress.     Breath sounds: No stridor.  Chest:     Breasts:        Right: Normal. No swelling, bleeding, inverted nipple, mass, nipple discharge, skin change or tenderness.        Left: Normal. No swelling, bleeding, inverted nipple, mass, nipple discharge, skin change or tenderness.  Musculoskeletal:        General: Normal range of motion.  Lymphadenopathy:     Upper Body:     Right upper body: No supraclavicular, axillary or pectoral adenopathy.     Left upper body: No supraclavicular, axillary or pectoral adenopathy.  Skin:    General: Skin is warm and dry.      Findings: No rash.  Neurological:     Mental Status: She is alert.     Motor: No abnormal muscle tone.     Coordination: Coordination normal.  Psychiatric:        Behavior: Behavior normal.       Fall Risk: Fall Risk  09/20/2019 09/27/2018 03/26/2018 02/09/2018 07/24/2017  Falls in the past year? 1 0 0 No No  Number falls in past yr: 1 - 0 - -  Injury with Fall? 1 - - - -    Functional Status Survey: Is the patient deaf or have difficulty hearing?: No Does the patient have difficulty seeing, even when wearing glasses/contacts?: No Does the patient have difficulty concentrating, remembering, or making decisions?: No Does the patient have difficulty walking or climbing stairs?: No Does the patient have difficulty dressing or bathing?: No Does the patient have difficulty doing errands alone such as visiting a doctor's office or shopping?: No   Assessment & Plan:    CPE completed today  . USPSTF grade A and B recommendations reviewed with patient; age-appropriate recommendations, preventive care, screening tests, etc discussed and encouraged; healthy living encouraged; see AVS for patient education given to patient  . Discussed importance of 150 minutes of physical activity weekly, AHA exercise recommendations given to pt in AVS/handout  . Discussed importance of healthy diet:  eating lean meats and proteins, avoiding trans fats and saturated fats, avoid simple sugars and excessive carbs in diet, eat 6 servings of fruit/vegetables daily and drink plenty of water and avoid sweet beverages.    . Recommended pt to do annual eye exam and routine dental exams/cleanings  . Depression, alcohol, fall screening completed as documented above and per flowsheets  . Reviewed Health Maintenance: Health Maintenance  Topic Date Due  . COVID-19 Vaccine (1) Never done  . Fecal DNA (Cologuard)  Never done  . MAMMOGRAM  11/19/2013  . Hepatitis C Screening  09/19/2020 (Originally 13-Jan-1961)  .  HIV Screening  09/19/2020 (Originally 10/20/1977)  . INFLUENZA VACCINE  12/01/2019  . PAP SMEAR-Modifier  11/19/2021  . TETANUS/TDAP  11/16/2023    . Immunizations: Immunization History  Administered Date(s) Administered  . Pneumococcal Polysaccharide-23 03/16/2007  . Td 12/25/2002  . Tdap 11/15/2013  Orders Placed This Encounter  Procedures  . MM 3D SCREEN BREAST BILATERAL    Standing Status:   Future    Standing Expiration Date:   09/19/2020    Order Specific Question:   Reason for Exam (SYMPTOM  OR DIAGNOSIS REQUIRED)    Answer:   screen for breast    Order Specific Question:   Is the patient pregnant?    Answer:   No    Order Specific Question:   Preferred imaging location?    Answer:   External  . Ambulatory referral to Gastroenterology    Referral Priority:   Routine    Referral Type:   Consultation    Referral Reason:   Specialty Services Required    Referred to Provider:   Lucilla Lame, MD    Number of Visits Requested:   1         ICD-10-CM   1. Adult general medical exam  Z00.00 CANCELED: CBC with Differential/Platelet    CANCELED: COMPLETE METABOLIC PANEL WITH GFR    CANCELED: Lipid panel  2. Screen for colon cancer  Z12.11 Ambulatory referral to Gastroenterology  3. Encounter for screening mammogram for malignant neoplasm of breast  Z12.31 MM 3D SCREEN BREAST BILATERAL  4. Coronary artery disease involving native coronary artery of native heart, angina presence unspecified  I25.10 rosuvastatin (CRESTOR) 40 MG tablet   per Dr. Humphrey Rolls  5. Tobacco use  Z72.0    discussed smoking cessation and low dose CT screening for lung CA - not interested right now in either  6. Anxiety  F41.9 venlafaxine XR (EFFEXOR-XR) 75 MG 24 hr capsule   well controlled on Effexor  7. Essential hypertension  I10 chlorthalidone (HYGROTON) 25 MG tablet    amLODipine (NORVASC) 5 MG tablet    losartan (COZAAR) 100 MG tablet    metoprolol succinate (TOPROL-XL) 50 MG 24 hr tablet     CANCELED: COMPLETE METABOLIC PANEL WITH GFR   stable, well controlled, labs today and meds refilled  8. Mixed hyperlipidemia  E78.2 rosuvastatin (CRESTOR) 40 MG tablet    CANCELED: COMPLETE METABOLIC PANEL WITH GFR    CANCELED: Lipid panel   due for lipid panel and CMP, on statin, tolerating, meds refilled  9. Proteinuria, unspecified type  R80.9    per nephrology  10. Medication monitoring encounter  Z51.81    labs canceled - pt to get access to other recent labs- sign for record release, would need CMP, CBC and lipid panel for CPE and med monitoring  11. Class 1 obesity with serious comorbidity and body mass index (BMI) of 31.0 to 31.9 in adult, unspecified obesity type  E66.9    Z68.31   12. Anxiety and depression  F41.9    F32.9    well controlled with effexor - would like prn xanax for fear of flying - discussed other benzos that I feel more comfortable with - such as ativan sparingly  13. Postmenopausal estrogen deficiency  Z78.0 DG Bone Density  14. Need for shingles vaccine  Z23 Zoster Vaccine Adjuvanted [facility]) injection   discussed shingles vaccine- sent to her pharmacy     Delsa Grana, PA-C 09/20/19 8:40 AM  [facility]

## 2019-09-24 ENCOUNTER — Other Ambulatory Visit: Payer: Self-pay

## 2019-09-24 ENCOUNTER — Telehealth (INDEPENDENT_AMBULATORY_CARE_PROVIDER_SITE_OTHER): Payer: Self-pay | Admitting: Gastroenterology

## 2019-09-24 DIAGNOSIS — Z1211 Encounter for screening for malignant neoplasm of colon: Secondary | ICD-10-CM

## 2019-09-24 NOTE — Progress Notes (Signed)
Gastroenterology Pre-Procedure Review  Request Date: 10/11/19 Requesting Physician: Dr. Allen Norris  PATIENT REVIEW QUESTIONS: The patient responded to the following health history questions as indicated:    1. Are you having any GI issues? no 2. Do you have a personal history of Polyps? no 3. Do you have a family history of Colon Cancer or Polyps? adopted 4. Diabetes Mellitus? no 5. Joint replacements in the past 12 months?no 6. Major health problems in the past 3 months?no 7. Any artificial heart valves, MVP, or defibrillator?no    MEDICATIONS & ALLERGIES:    Patient reports the following regarding taking any anticoagulation/antiplatelet therapy:   Plavix, Coumadin, Eliquis, Xarelto, Lovenox, Pradaxa, Brilinta, or Effient?no Aspirin? yes (81mg )  Patient confirms/reports the following medications:  Current Outpatient Medications  Medication Sig Dispense Refill  . amLODipine (NORVASC) 5 MG tablet Take 1 tablet (5 mg total) by mouth daily. 90 tablet 3  . aspirin 81 MG tablet Take 81 mg by mouth daily.    . chlorthalidone (HYGROTON) 25 MG tablet Take 25 mg by mouth daily.    . hydrochlorothiazide (HYDRODIURIL) 25 MG tablet Take 1 tablet (25 mg total) by mouth daily. 90 tablet 0  . losartan (COZAAR) 100 MG tablet Take 1 tablet (100 mg total) by mouth daily. 90 tablet 3  . metoprolol succinate (TOPROL-XL) 50 MG 24 hr tablet Take 1 tablet (50 mg total) by mouth daily. Take with or immediately following a meal. 90 tablet 3  . rosuvastatin (CRESTOR) 40 MG tablet Take 1 tablet (40 mg total) by mouth every morning. 90 tablet 3  . ticagrelor (BRILINTA) 60 MG TABS tablet Take 60 mg by mouth 2 (two) times daily.    Marland Kitchen venlafaxine XR (EFFEXOR-XR) 75 MG 24 hr capsule Take 1 capsule (75 mg total) by mouth daily with breakfast. 90 capsule 3   No current facility-administered medications for this visit.    Patient confirms/reports the following allergies:  Allergies  Allergen Reactions  . Dilaudid  [Hydromorphone Hcl] Nausea And Vomiting  . Hydromorphone Nausea Only and Nausea And Vomiting    No orders of the defined types were placed in this encounter.   AUTHORIZATION INFORMATION Primary Insurance: 1D#: Group #:  Secondary Insurance: 1D#: Group #:  SCHEDULE INFORMATION: Date: 10/11/19 Time:Nurse will call to give arrival time [place]

## 2019-10-01 ENCOUNTER — Encounter: Payer: Self-pay | Admitting: Family Medicine

## 2019-10-03 ENCOUNTER — Other Ambulatory Visit: Payer: Self-pay

## 2019-10-03 ENCOUNTER — Encounter: Payer: Self-pay | Admitting: Gastroenterology

## 2019-10-04 DIAGNOSIS — I2510 Atherosclerotic heart disease of native coronary artery without angina pectoris: Secondary | ICD-10-CM | POA: Diagnosis not present

## 2019-10-04 DIAGNOSIS — Z9861 Coronary angioplasty status: Secondary | ICD-10-CM | POA: Diagnosis not present

## 2019-10-04 DIAGNOSIS — R0602 Shortness of breath: Secondary | ICD-10-CM | POA: Diagnosis not present

## 2019-10-04 DIAGNOSIS — E782 Mixed hyperlipidemia: Secondary | ICD-10-CM | POA: Diagnosis not present

## 2019-10-04 DIAGNOSIS — I10 Essential (primary) hypertension: Secondary | ICD-10-CM | POA: Diagnosis not present

## 2019-10-08 ENCOUNTER — Telehealth: Payer: Self-pay

## 2019-10-08 NOTE — Telephone Encounter (Signed)
Cardiac clearance received from [facility] on 10/04/19.  Dr. April Manson follow up note for clearance noted the following: " Low risk for colonoscopy as had normal stress test and echo had normal LVEF".  Thanks,  [place], [place]

## 2019-10-09 ENCOUNTER — Other Ambulatory Visit: Payer: Self-pay

## 2019-10-09 ENCOUNTER — Other Ambulatory Visit
Admission: RE | Admit: 2019-10-09 | Discharge: 2019-10-09 | Disposition: A | Discharge status: Home / Self Care | Payer: 59 | Source: Ambulatory Visit | Attending: Gastroenterology | Admitting: Gastroenterology

## 2019-10-09 DIAGNOSIS — Z20822 Contact with and (suspected) exposure to covid-19: Secondary | ICD-10-CM | POA: Diagnosis not present

## 2019-10-09 DIAGNOSIS — Z01812 Encounter for preprocedural laboratory examination: Secondary | ICD-10-CM | POA: Insufficient documentation

## 2019-10-09 LAB — SARS CORONAVIRUS 2 (TAT 6-24 HRS): SARS Coronavirus 2: NEGATIVE

## 2019-10-10 NOTE — Discharge Instructions (Signed)
General Anesthesia, Adult, Care After This sheet gives you information about how to care for yourself after your procedure. Your health care provider may also give you more specific instructions. If you have problems or questions, contact your health care provider. What can I expect after the procedure? After the procedure, the following side effects are common:  Pain or discomfort at the IV site.  Nausea.  Vomiting.  Sore throat.  Trouble concentrating.  Feeling cold or chills.  Weak or tired.  Sleepiness and fatigue.  Soreness and body aches. These side effects can affect parts of the body that were not involved in surgery. Follow these instructions at home:  For at least 24 hours after the procedure:  Have a responsible adult stay with you. It is important to have someone help care for you until you are awake and alert.  Rest as needed.  Do not: ? Participate in activities in which you could fall or become injured. ? Drive. ? Use heavy machinery. ? Drink alcohol. ? Take sleeping pills or medicines that cause drowsiness. ? Make important decisions or sign legal documents. ? Take care of children on your own. Eating and drinking  Follow any instructions from your health care provider about eating or drinking restrictions.  When you feel hungry, start by eating small amounts of foods that are soft and easy to digest (bland), such as toast. Gradually return to your regular diet.  Drink enough fluid to keep your urine pale yellow.  If you vomit, rehydrate by drinking water, juice, or clear broth. General instructions  If you have sleep apnea, surgery and certain medicines can increase your risk for breathing problems. Follow instructions from your health care provider about wearing your sleep device: ? Anytime you are sleeping, including during daytime naps. ? While taking prescription pain medicines, sleeping medicines, or medicines that make you drowsy.  Return to  your normal activities as told by your health care provider. Ask your health care provider what activities are safe for you.  Take over-the-counter and prescription medicines only as told by your health care provider.  If you smoke, do not smoke without supervision.  Keep all follow-up visits as told by your health care provider. This is important. Contact a health care provider if:  You have nausea or vomiting that does not get better with medicine.  You cannot eat or drink without vomiting.  You have pain that does not get better with medicine.  You are unable to pass urine.  You develop a skin rash.  You have a fever.  You have redness around your IV site that gets worse. Get help right away if:  You have difficulty breathing.  You have chest pain.  You have blood in your urine or stool, or you vomit blood. Summary  After the procedure, it is common to have a sore throat or nausea. It is also common to feel tired.  Have a responsible adult stay with you for the first 24 hours after general anesthesia. It is important to have someone help care for you until you are awake and alert.  When you feel hungry, start by eating small amounts of foods that are soft and easy to digest (bland), such as toast. Gradually return to your regular diet.  Drink enough fluid to keep your urine pale yellow.  Return to your normal activities as told by your health care provider. Ask your health care provider what activities are safe for you. This information is not   intended to replace advice given to you by your health care provider. Make sure you discuss any questions you have with your health care provider. Document Revised: 04/21/2017 Document Reviewed: 12/02/2016 Elsevier Patient Education  2020 [employer]  

## 2019-10-11 ENCOUNTER — Encounter
Admission: RE | Disposition: A | Discharge status: Home / Self Care | Payer: Self-pay | Source: Home / Self Care | Attending: Gastroenterology

## 2019-10-11 ENCOUNTER — Ambulatory Visit
Admission: RE | Admit: 2019-10-11 | Discharge: 2019-10-11 | Disposition: A | Discharge status: Home / Self Care | Payer: 59 | Attending: Gastroenterology | Admitting: Gastroenterology

## 2019-10-11 ENCOUNTER — Other Ambulatory Visit: Payer: Self-pay

## 2019-10-11 ENCOUNTER — Ambulatory Visit: Payer: 59 | Admitting: Anesthesiology

## 2019-10-11 ENCOUNTER — Encounter: Payer: Self-pay | Admitting: Gastroenterology

## 2019-10-11 DIAGNOSIS — I2510 Atherosclerotic heart disease of native coronary artery without angina pectoris: Secondary | ICD-10-CM | POA: Insufficient documentation

## 2019-10-11 DIAGNOSIS — Z955 Presence of coronary angioplasty implant and graft: Secondary | ICD-10-CM | POA: Insufficient documentation

## 2019-10-11 DIAGNOSIS — D122 Benign neoplasm of ascending colon: Secondary | ICD-10-CM | POA: Insufficient documentation

## 2019-10-11 DIAGNOSIS — G4730 Sleep apnea, unspecified: Secondary | ICD-10-CM | POA: Diagnosis not present

## 2019-10-11 DIAGNOSIS — D123 Benign neoplasm of transverse colon: Secondary | ICD-10-CM | POA: Insufficient documentation

## 2019-10-11 DIAGNOSIS — E785 Hyperlipidemia, unspecified: Secondary | ICD-10-CM | POA: Insufficient documentation

## 2019-10-11 DIAGNOSIS — I081 Rheumatic disorders of both mitral and tricuspid valves: Secondary | ICD-10-CM | POA: Insufficient documentation

## 2019-10-11 DIAGNOSIS — F17210 Nicotine dependence, cigarettes, uncomplicated: Secondary | ICD-10-CM | POA: Insufficient documentation

## 2019-10-11 DIAGNOSIS — K635 Polyp of colon: Secondary | ICD-10-CM | POA: Diagnosis not present

## 2019-10-11 DIAGNOSIS — Z7982 Long term (current) use of aspirin: Secondary | ICD-10-CM | POA: Insufficient documentation

## 2019-10-11 DIAGNOSIS — K648 Other hemorrhoids: Secondary | ICD-10-CM | POA: Insufficient documentation

## 2019-10-11 DIAGNOSIS — E669 Obesity, unspecified: Secondary | ICD-10-CM | POA: Insufficient documentation

## 2019-10-11 DIAGNOSIS — Z885 Allergy status to narcotic agent status: Secondary | ICD-10-CM | POA: Diagnosis not present

## 2019-10-11 DIAGNOSIS — Z1211 Encounter for screening for malignant neoplasm of colon: Secondary | ICD-10-CM

## 2019-10-11 DIAGNOSIS — K621 Rectal polyp: Secondary | ICD-10-CM | POA: Diagnosis not present

## 2019-10-11 DIAGNOSIS — I10 Essential (primary) hypertension: Secondary | ICD-10-CM | POA: Diagnosis not present

## 2019-10-11 DIAGNOSIS — K5730 Diverticulosis of large intestine without perforation or abscess without bleeding: Secondary | ICD-10-CM | POA: Diagnosis not present

## 2019-10-11 DIAGNOSIS — Z79899 Other long term (current) drug therapy: Secondary | ICD-10-CM | POA: Diagnosis not present

## 2019-10-11 DIAGNOSIS — D125 Benign neoplasm of sigmoid colon: Secondary | ICD-10-CM | POA: Diagnosis not present

## 2019-10-11 HISTORY — PX: POLYPECTOMY: SHX5525

## 2019-10-11 HISTORY — DX: Sleep apnea, unspecified: G47.30

## 2019-10-11 HISTORY — PX: COLONOSCOPY WITH PROPOFOL: SHX5780

## 2019-10-11 SURGERY — COLONOSCOPY WITH PROPOFOL
Anesthesia: General

## 2019-10-11 MED ORDER — LACTATED RINGERS IV SOLN
10.0000 mL/h | INTRAVENOUS | Status: DC
  Administered 2019-10-11: 10 mL/h via INTRAVENOUS

## 2019-10-11 MED ORDER — ACETAMINOPHEN 160 MG/5ML PO SOLN: 325.0000 mg | Freq: Once | ORAL | Status: DC

## 2019-10-11 MED ORDER — SODIUM CHLORIDE 0.9 % IV SOLN: INTRAVENOUS | Status: DC

## 2019-10-11 MED ORDER — PROPOFOL 10 MG/ML IV BOLUS
INTRAVENOUS | Status: DC | PRN
  Administered 2019-10-11 (×4): 30 mg via INTRAVENOUS
  Administered 2019-10-11: 20 mg via INTRAVENOUS
  Administered 2019-10-11: 30 mg via INTRAVENOUS
  Administered 2019-10-11: 100 mg via INTRAVENOUS
  Administered 2019-10-11: 30 mg via INTRAVENOUS
  Administered 2019-10-11 (×3): 20 mg via INTRAVENOUS
  Administered 2019-10-11 (×6): 30 mg via INTRAVENOUS
  Administered 2019-10-11: 40 mg via INTRAVENOUS
  Administered 2019-10-11 (×2): 30 mg via INTRAVENOUS
  Administered 2019-10-11: 20 mg via INTRAVENOUS
  Administered 2019-10-11 (×2): 30 mg via INTRAVENOUS
  Administered 2019-10-11: 40 mg via INTRAVENOUS
  Administered 2019-10-11 (×4): 30 mg via INTRAVENOUS
  Administered 2019-10-11: 40 mg via INTRAVENOUS
  Administered 2019-10-11 (×2): 30 mg via INTRAVENOUS
  Administered 2019-10-11: 20 mg via INTRAVENOUS
  Administered 2019-10-11: 30 mg via INTRAVENOUS
  Administered 2019-10-11: 20 mg via INTRAVENOUS
  Administered 2019-10-11: 30 mg via INTRAVENOUS
  Administered 2019-10-11: 40 mg via INTRAVENOUS
  Administered 2019-10-11: 20 mg via INTRAVENOUS
  Administered 2019-10-11 (×5): 30 mg via INTRAVENOUS
  Administered 2019-10-11: 20 mg via INTRAVENOUS
  Administered 2019-10-11: 30 mg via INTRAVENOUS
  Administered 2019-10-11: 20 mg via INTRAVENOUS

## 2019-10-11 MED ORDER — ACETAMINOPHEN 325 MG PO TABS: 325.0000 mg | ORAL_TABLET | Freq: Once | ORAL | Status: DC

## 2019-10-11 MED ORDER — LIDOCAINE HCL (CARDIAC) PF 100 MG/5ML IV SOSY
PREFILLED_SYRINGE | INTRAVENOUS | Status: DC | PRN
  Administered 2019-10-11: 30 mg via INTRAVENOUS

## 2019-10-11 MED ORDER — STERILE WATER FOR IRRIGATION IR SOLN: Status: DC | PRN

## 2019-10-11 SURGICAL SUPPLY — 29 items
CLIP HMST 235XBRD CATH ROT (MISCELLANEOUS) IMPLANT
CLIP RESOLUTION 360 11X235 (MISCELLANEOUS) ×33
ELECT REM PT RETURN 9FT ADLT (ELECTROSURGICAL) ×3
ELECTRODE REM PT RTRN 9FT ADLT (ELECTROSURGICAL) IMPLANT
ELEVIEW  INJECTABLE COMP 10 (MISCELLANEOUS) ×2
FCP ESCP3.2XJMB 240X2.8X (MISCELLANEOUS)
FORCEPS BIOP RAD 4 LRG CAP 4 (CUTTING FORCEPS) IMPLANT
FORCEPS BIOP RJ4 240 W/NDL (MISCELLANEOUS)
FORCEPS ESCP3.2XJMB 240X2.8X (MISCELLANEOUS) IMPLANT
GOWN CVR UNV OPN BCK APRN NK (MISCELLANEOUS) ×4 IMPLANT
GOWN ISOL THUMB LOOP REG UNIV (MISCELLANEOUS) ×6
INJECTABLE ELEVIEW COMP 10 (MISCELLANEOUS) IMPLANT
INJECTOR VARIJECT VIN23 (MISCELLANEOUS) IMPLANT
KIT DEFENDO VALVE AND CONN (KITS) IMPLANT
KIT ENDO PROCEDURE OLY (KITS) ×3 IMPLANT
MANIFOLD NEPTUNE II (INSTRUMENTS) ×1 IMPLANT
MARKER SPOT ENDO TATTOO 5ML (MISCELLANEOUS) IMPLANT
NDL HYPO 18GX1.5 BLUNT FILL (NEEDLE) IMPLANT
NEEDLE HYPO 18GX1.5 BLUNT FILL (NEEDLE) ×6 IMPLANT
PROBE APC STR FIRE (PROBE) IMPLANT
RETRIEVER NET ROTH 2.5X230 LF (MISCELLANEOUS) ×1 IMPLANT
SNARE SHORT THROW 13M SML OVAL (MISCELLANEOUS) IMPLANT
SNARE SHORT THROW 30M LRG OVAL (MISCELLANEOUS) IMPLANT
SNARE SNG USE RND 15MM (INSTRUMENTS) ×3 IMPLANT
SPOT EX ENDOSCOPIC TATTOO (MISCELLANEOUS)
SYR 10ML LL (SYRINGE) ×2 IMPLANT
TRAP ETRAP POLY (MISCELLANEOUS) ×1 IMPLANT
VARIJECT INJECTOR VIN23 (MISCELLANEOUS) ×3
WATER STERILE IRR 250ML POUR (IV SOLUTION) ×3 IMPLANT

## 2019-10-11 NOTE — Transfer of Care (Signed)
Immediate Anesthesia Transfer of Care Note  Patient: Maria Franco  Procedure(s) Performed: COLONOSCOPY WITH PROPOFOL (N/A ) POLYPECTOMY  Patient Location: PACU  Anesthesia Type: General  Level of Consciousness: awake, alert  and patient cooperative  Airway and Oxygen Therapy: Patient Spontanous Breathing and Patient connected to supplemental oxygen  Post-op Assessment: Post-op Vital signs reviewed, Patient's Cardiovascular Status Stable, Respiratory Function Stable, Patent Airway and No signs of Nausea or vomiting  Post-op Vital Signs: Reviewed and stable  Complications: No complications documented.

## 2019-10-11 NOTE — Anesthesia Procedure Notes (Signed)
Date/Time: 10/11/2019 11:30 AM Performed by: Cameron Ali, CRNA Pre-anesthesia Checklist: Patient identified, Emergency Drugs available, Suction available, Timeout performed and Patient being monitored Patient Re-evaluated:Patient Re-evaluated prior to induction Oxygen Delivery Method: Nasal cannula Placement Confirmation: positive ETCO2

## 2019-10-11 NOTE — H&P (Signed)
Lucilla Lame, MD [address] [place], [place] [address] Phone: [phone] Fax : [phone]  Primary Care Physician:  Delsa Grana, PA-C Primary Gastroenterologist:  Dr. Allen Norris  Pre-Procedure History & Physical: HPI:  Maria Franco is a 59 y.o. female is here for a screening colonoscopy.   Past Medical History:  Diagnosis Date  . Adrenal mass, left ([place])    followed by Endocrine, hormone testing q 5 years and CT q 1-2 years  . CAD (coronary artery disease)   . Depression   . Hyperlipidemia   . Hypertension    controlled on med  . MVP (mitral valve prolapse)   . Obesity   . Panic attacks   . Plantar fascia syndrome   . Presence of stent in LAD coronary artery   . 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 Sleep apnea    no CPAP  . Tricuspid valve regurgitation     Past Surgical History:  Procedure Laterality Date  . CORONARY ANGIOPLASTY WITH STENT PLACEMENT  2006   x1  . ENDOMETRIAL ABLATION  2012  . HERNIA REPAIR     at 7 yrs of age  . TUBAL LIGATION  2012    Prior to Admission medications   Medication Sig Start Date End Date Taking? Authorizing Provider  amLODipine (NORVASC) 5 MG tablet Take 1 tablet (5 mg total) by mouth daily. 09/20/19  Yes Delsa Grana, PA-C  aspirin 81 MG tablet Take 81 mg by mouth daily.   Yes [provider]  chlorthalidone (HYGROTON) 25 MG tablet Take 25 mg by mouth daily. 09/04/19  Yes [provider]  Cholecalciferol (VITAMIN D3) 50 MCG (2000 UT) TABS Take by mouth.   Yes [provider]  hydrochlorothiazide (HYDRODIURIL) 25 MG tablet Take 1 tablet (25 mg total) by mouth daily. 06/24/19  Yes Delsa Grana, PA-C  losartan (COZAAR) 100 MG tablet Take 1 tablet (100 mg total) by mouth daily. 09/20/19  Yes Delsa Grana, PA-C  metoprolol succinate (TOPROL-XL) 50 MG 24 hr tablet Take 1 tablet (50 mg total) by mouth daily. Take with or immediately following a meal. 09/20/19  Yes Tapia,  Kristeen Miss, PA-C  Multiple Vitamins-Minerals (MULTIVITAMIN WOMEN PO) Take by mouth. Vitafusion for women gummies   Yes [provider]  rosuvastatin (CRESTOR) 40 MG tablet Take 1 tablet (40 mg total) by mouth every morning. 09/20/19  Yes Delsa Grana, PA-C  venlafaxine XR (EFFEXOR-XR) 75 MG 24 hr capsule Take 1 capsule (75 mg total) by mouth daily with breakfast. 09/20/19  Yes Delsa Grana, PA-C  zinc gluconate 50 MG tablet Take 50 mg by mouth daily.   Yes [provider]  ticagrelor (BRILINTA) 60 MG TABS tablet Take 60 mg by mouth 2 (two) times daily.    [provider]    Allergies as of 09/24/2019 - Review Complete 09/24/2019  Allergen Reaction Noted  . Dilaudid [hydromorphone hcl] Nausea And Vomiting 07/16/2013  . Hydromorphone Nausea Only and Nausea And Vomiting 10/30/2015    Family History  Adopted: Yes  Family history unknown: Yes    Social History   Socioeconomic History  . Marital status: Married    Spouse name: Steven"todd"  . Number of children: Not on file  . Years of education: 47  . Highest education level: Bachelor's degree (e.g., BA, AB, BS)  Occupational History  . Not on file  Tobacco Use  . Smoking status: Current Every Day Smoker    Packs/day:  1.00    Years: 30.00    Pack years: 30.00    Types: Cigarettes  . Smokeless tobacco: Never Used  . Tobacco comment: 39 years  Vaping Use  . Vaping Use: Former  Substance and Sexual Activity  . Alcohol use: Yes    Alcohol/week: 6.0 standard drinks    Types: 6 Glasses of wine per week    Comment: Occasionally  . Drug use: No  . Sexual activity: Yes  Other Topics Concern  . Not on file  Social History Narrative  . Not on file   Social Determinants of Health   Financial Resource Strain:   . Difficulty of Paying Living Expenses:   Food Insecurity:   . Worried About Charity fundraiser in the Last Year:   . Arboriculturist in the Last Year:   Transportation Needs:   . Lexicographer (Medical):   Marland Kitchen Lack of Transportation (Non-Medical):   Physical Activity:   . Days of Exercise per Week:   . Minutes of Exercise per Session:   Stress:   . Feeling of Stress :   Social Connections:   . Frequency of Communication with Friends and Family:   . Frequency of Social Gatherings with Friends and Family:   . Attends Religious Services:   . Active Member of Clubs or Organizations:   . Attends Archivist Meetings:   Marland Kitchen Marital Status:   Intimate Partner Violence:   . Fear of Current or Ex-Partner:   . Emotionally Abused:   Marland Kitchen Physically Abused:   . Sexually Abused:     Review of Systems: See HPI, otherwise negative ROS  Physical Exam: BP (!) 141/90   Pulse 71   Temp 97.9 F (36.6 C) (Temporal)   Resp 16   Ht 5' 5.25" (1.657 m)   Wt 84.4 kg   SpO2 98%   BMI 30.72 kg/m  General:   Alert,  pleasant and cooperative in NAD Head:  Normocephalic and atraumatic. Neck:  Supple; no masses or thyromegaly. Lungs:  Clear throughout to auscultation.    Heart:  Regular rate and rhythm. Abdomen:  Soft, nontender and nondistended. Normal bowel sounds, without guarding, and without rebound.   Neurologic:  Alert and  oriented x4;  grossly normal neurologically.  Impression/Plan: Maria Franco is now here to undergo a screening colonoscopy.  Risks, benefits, and alternatives regarding colonoscopy have been reviewed with the patient.  Questions have been answered.  All parties agreeable.

## 2019-10-11 NOTE — Op Note (Signed)
[facility] Gastroenterology Patient Name: Maria Franco Procedure Date: 10/11/2019 11:27 AM MRN: [id number] Account #: [id number] Date of Birth: 1960-06-04 Admit Type: Outpatient Age: 59 Room: [facility] OR ROOM 01 Gender: Female Note Status: Finalized Procedure:             Colonoscopy Indications:           Screening for colorectal malignant neoplasm Providers:             Lucilla Lame MD, MD Referring MD:          Delsa Grana (Referring MD) Medicines:             Propofol per Anesthesia Complications:         No immediate complications. Procedure:             Pre-Anesthesia Assessment:                        - Prior to the procedure, a History and Physical was                         performed, and patient medications and allergies were                         reviewed. The patient's tolerance of previous                         anesthesia was also reviewed. The risks and benefits                         of the procedure and the sedation options and risks                         were discussed with the patient. All questions were                         answered, and informed consent was obtained. Prior                         Anticoagulants: The patient has taken no previous                         anticoagulant or antiplatelet agents. ASA Grade                         Assessment: II - A patient with mild systemic disease.                         After reviewing the risks and benefits, the patient                         was deemed in satisfactory condition to undergo the                         procedure.                        After obtaining informed consent, the colonoscope was  passed under direct vision. Throughout the procedure,                         the patient's blood pressure, pulse, and oxygen                         saturations were monitored continuously. The                         Colonoscope was introduced through the anus and                          advanced to the the cecum, identified by appendiceal                         orifice and ileocecal valve. The colonoscopy was                         performed without difficulty. The patient tolerated                         the procedure well. The quality of the bowel                         preparation was excellent. Findings:      The perianal and digital rectal examinations were normal.      A 20 mm polyp was found in the ascending colon. The polyp was sessile.       Area was successfully injected with 4 mL saline with indigo carmine for       a lift polypectomy. Preparations were made for mucosal resection.       Chromoscopy with indigo carmine was done to mark the borders of the       lesion. Saline with indigo carmine was injected to raise the lesion.       Snare mucosal resection was performed. Resection and retrieval were       complete. To prevent bleeding post-intervention, five hemostatic clips       were successfully placed (MR conditional). There was no bleeding at the       end of the procedure.      A 12 mm polyp was found in the ascending colon. The polyp was sessile.       Area was successfully injected with 2 mL saline with indigo carmine for       a lift polypectomy. To prevent bleeding post-intervention, two       hemostatic clips were successfully placed (MR conditional). There was no       bleeding at the end of the procedure. Preparations were made for mucosal       resection. Chromoscopy with indigo carmine was done to mark the borders       of the lesion. Saline with indigo carmine was injected to raise the       lesion. Snare mucosal resection was performed. Resection and retrieval       were complete.      A 4 mm polyp was found in the ascending colon. The polyp was sessile.       The polyp was removed with a cold snare. Resection and retrieval were       complete.  A 6 mm polyp was found in the transverse colon. The polyp was  sessile.       The polyp was removed with a cold snare. Resection and retrieval were       complete.      Three pedunculated polyps were found in the sigmoid colon. The polyps       were 10 mm in size. These polyps were removed with a hot snare.       Resection and retrieval were complete. To prevent bleeding       post-intervention, three hemostatic clips were successfully placed (MR       conditional). There was no bleeding at the end of the procedure.      A 4 mm polyp was found in the hepatic flexure. The polyp was sessile.       The polyp was removed with a hot snare. Resection and retrieval were       complete.      Two sessile polyps were found in the rectum. The polyps were 3 to 4 mm       in size. These polyps were removed with a cold snare. Resection and       retrieval were complete.      Multiple small-mouthed diverticula were found in the sigmoid colon.      Non-bleeding internal hemorrhoids were found during retroflexion. The       hemorrhoids were Grade II (internal hemorrhoids that prolapse but reduce       spontaneously). Impression:            - One 20 mm polyp in the ascending colon, removed with                         a hot snare. Resected and retrieved. Injected. Clips                         (MR conditional) were placed.                        - One 12 mm polyp in the ascending colon, removed with                         a hot snare. Resected and retrieved. Injected. Clips                         (MR conditional) were placed.                        - One 4 mm polyp in the ascending colon, removed with                         a cold snare. Resected and retrieved.                        - One 6 mm polyp in the transverse colon, removed with                         a cold snare. Resected and retrieved.                        - Three 10 mm polyps in the sigmoid colon, removed  with a hot snare. Resected and retrieved. Clips (MR                          conditional) were placed.                        - One 4 mm polyp at the hepatic flexure, removed with                         a hot snare. Resected and retrieved.                        - Two 3 to 4 mm polyps in the rectum, removed with a                         cold snare. Resected and retrieved.                        - Diverticulosis in the sigmoid colon.                        - Non-bleeding internal hemorrhoids. Recommendation:        - Discharge patient to home.                        - Resume previous diet.                        - Continue present medications.                        - Await pathology results.                        - Repeat colonoscopy in 3 months for surveillance of                         multiple polyps. Procedure Code(s):     --- Professional ---                        [phone], Colonoscopy, flexible; with endoscopic mucosal                         resection                        [id number], 67, Colonoscopy, flexible; with removal of                         tumor(s), polyp(s), or other lesion(s) by snare                         technique Diagnosis Code(s):     --- Professional ---                        Z12.11, Encounter for screening for malignant neoplasm                         of colon  K63.5, Polyp of colon                        K62.1, Rectal polyp CPT copyright 2019 American Medical Association. All rights reserved. The codes documented in this report are preliminary and upon coder review may  be revised to meet current compliance requirements. Lucilla Lame MD, MD 10/11/2019 1:13:43 PM This report has been signed electronically. Number of Addenda: 0 Note Initiated On: 10/11/2019 11:27 AM Scope Withdrawal Time: 1 hour 18 minutes 6 seconds  Total Procedure Duration: 1 hour 25 minutes 48 seconds  Estimated Blood Loss:  Estimated blood loss: none.      [facility]

## 2019-10-11 NOTE — Anesthesia Preprocedure Evaluation (Signed)
Anesthesia Evaluation  Patient identified by MRN, date of birth, ID band Patient awake    Reviewed: Allergy & Precautions, H&P , NPO status , Patient's Chart, lab work & pertinent test results  Airway Mallampati: II  TM Distance: >3 FB Neck ROM: full    Dental no notable dental hx.    Pulmonary sleep apnea , Current SmokerPatient did not abstain from smoking.,    Pulmonary exam normal breath sounds clear to auscultation       Cardiovascular hypertension, + CAD and + Cardiac Stents  Normal cardiovascular exam Rhythm:regular Rate:Normal     Neuro/Psych PSYCHIATRIC DISORDERS    GI/Hepatic   Endo/Other    Renal/GU      Musculoskeletal   Abdominal   Peds  Hematology   Anesthesia Other Findings   Reproductive/Obstetrics                             Anesthesia Physical Anesthesia Plan  ASA: II  Anesthesia Plan: General   Post-op Pain Management:    Induction: Intravenous  PONV Risk Score and Plan: 3 and Treatment may vary due to age or medical condition, TIVA and Propofol infusion  Airway Management Planned: Natural Airway  Additional Equipment:   Intra-op Plan:   Post-operative Plan:   Informed Consent: I have reviewed the patients History and Physical, chart, labs and discussed the procedure including the risks, benefits and alternatives for the proposed anesthesia with the patient or authorized representative who has indicated his/her understanding and acceptance.     Dental Advisory Given  Plan Discussed with: CRNA  Anesthesia Plan Comments:         Anesthesia Quick Evaluation

## 2019-10-11 NOTE — Anesthesia Postprocedure Evaluation (Signed)
Anesthesia Post Note  Patient: Maria Franco  Procedure(s) Performed: COLONOSCOPY WITH PROPOFOL (N/A ) POLYPECTOMY     Patient location during evaluation: PACU Anesthesia Type: General Level of consciousness: awake and alert and oriented Pain management: satisfactory to patient Vital Signs Assessment: post-procedure vital signs reviewed and stable Respiratory status: spontaneous breathing, nonlabored ventilation and respiratory function stable Cardiovascular status: blood pressure returned to baseline and stable Postop Assessment: Adequate PO intake and No signs of nausea or vomiting Anesthetic complications: no   No complications documented.  Raliegh Ip

## 2019-10-14 ENCOUNTER — Encounter: Payer: Self-pay | Admitting: Gastroenterology

## 2019-10-16 ENCOUNTER — Encounter: Payer: Self-pay | Admitting: Gastroenterology

## 2019-10-16 LAB — SURGICAL PATHOLOGY

## 2020-03-20 ENCOUNTER — Ambulatory Visit: Payer: 59 | Admitting: Family Medicine

## 2020-03-31 ENCOUNTER — Encounter: Payer: Self-pay | Admitting: Family Medicine

## 2020-03-31 ENCOUNTER — Other Ambulatory Visit: Payer: Self-pay

## 2020-03-31 ENCOUNTER — Ambulatory Visit (INDEPENDENT_AMBULATORY_CARE_PROVIDER_SITE_OTHER): Payer: 59 | Admitting: Family Medicine

## 2020-03-31 VITALS — BP 122/76 | HR 85 | Temp 99.3°F | Resp 16 | Ht 65.0 in | Wt 192.3 lb

## 2020-03-31 DIAGNOSIS — Z5181 Encounter for therapeutic drug level monitoring: Secondary | ICD-10-CM | POA: Diagnosis not present

## 2020-03-31 DIAGNOSIS — E669 Obesity, unspecified: Secondary | ICD-10-CM

## 2020-03-31 DIAGNOSIS — Z955 Presence of coronary angioplasty implant and graft: Secondary | ICD-10-CM | POA: Diagnosis not present

## 2020-03-31 DIAGNOSIS — N029 Recurrent and persistent hematuria with unspecified morphologic changes: Secondary | ICD-10-CM

## 2020-03-31 DIAGNOSIS — Z6832 Body mass index (BMI) 32.0-32.9, adult: Secondary | ICD-10-CM

## 2020-03-31 DIAGNOSIS — I2583 Coronary atherosclerosis due to lipid rich plaque: Secondary | ICD-10-CM | POA: Diagnosis not present

## 2020-03-31 DIAGNOSIS — E782 Mixed hyperlipidemia: Secondary | ICD-10-CM

## 2020-03-31 DIAGNOSIS — Z720 Tobacco use: Secondary | ICD-10-CM | POA: Diagnosis not present

## 2020-03-31 DIAGNOSIS — N289 Disorder of kidney and ureter, unspecified: Secondary | ICD-10-CM

## 2020-03-31 DIAGNOSIS — I2510 Atherosclerotic heart disease of native coronary artery without angina pectoris: Secondary | ICD-10-CM | POA: Diagnosis not present

## 2020-03-31 DIAGNOSIS — I10 Essential (primary) hypertension: Secondary | ICD-10-CM | POA: Diagnosis not present

## 2020-03-31 DIAGNOSIS — R69 Illness, unspecified: Secondary | ICD-10-CM | POA: Diagnosis not present

## 2020-03-31 DIAGNOSIS — R809 Proteinuria, unspecified: Secondary | ICD-10-CM | POA: Diagnosis not present

## 2020-03-31 DIAGNOSIS — Z1231 Encounter for screening mammogram for malignant neoplasm of breast: Secondary | ICD-10-CM

## 2020-03-31 DIAGNOSIS — F410 Panic disorder [episodic paroxysmal anxiety] without agoraphobia: Secondary | ICD-10-CM

## 2020-03-31 MED ORDER — CHLORTHALIDONE 25 MG PO TABS: 25.0000 mg | ORAL_TABLET | Freq: Every day | ORAL | 3 refills | Status: DC

## 2020-03-31 NOTE — Progress Notes (Signed)
Name: Maria Franco   MRN: [id number]    DOB: 30-Aug-1960   Date:03/31/2020       Progress Note  Chief Complaint  Patient presents with  . Hyperlipidemia  . Hypertension  . Depression     Subjective:   Maria Franco is a 59 y.o. female, presents to clinic for routine f/up   Last seen for CPE May - pt was new to me, had not been seen for the prior year due to PCP leaving and COVID - had attempted to do CPE, transition care to new pcp and do f/up at that time:  asking for med refills, due for routine f/up on multiple chronic conditions: Overall tries to be very healthy and holistic, continues to smoke  Hypertension Patient is onlosartan 100mg , amlodipine 5mg , metoprolol 50mg  chlorthalidone 25 mg  Pt reports good med compliance and denies any SE.   Blood pressure today is well controlled. BP Readings from Last 3 Encounters:  03/31/20 122/76  10/11/19 (!) 151/87  09/20/19 126/78   Pt denies CP, SOB, exertional sx, LE edema, palpitation, Ha's, visual disturbances, lightheadedness, hypotension, syncope.  Coronary artery disease - sees cardiology every 6 months - records not available in system and not received after requested last OV Patient sees cardiologist Dr. Humphrey Rolls. Last stress test in the past 2 months, no exertional sx She is taking Brilinta and 81 mg aspirin - still on and doing well Is status post PCI of the LAD in 2006. Denies excessive bruising or bleeding, no blood in stools.  Hyperlipidemia Patient rxcrestor 40mg  Pt reports good med compliance Last Lipids: Lab Results  Component Value Date   CHOL 121 03/26/2018   HDL 58 03/26/2018   LDLCALC 44 03/26/2018   TRIG 105 03/26/2018   CHOLHDL 2.1 03/26/2018   - Denies: Chest pain, shortness of breath, myalgias, claudication - Documented aortic atherosclerosis? No, CAD - Risk factors for atherosclerosis: diabetes mellitus, hypercholesterolemia and hypertension  Adrenal mass - no change in past 6  months Large work up in the past and rechecked recently released from endocrinology/specialist, no further work up  Proteinuria PCP noted microalbuminemia and proteinuria. Renal ultrasound was normal. Kidney function preserved. She was referred to nephrology states due to pandemic appointment has been pushed out. Drinks plenty of water, denies flank pain nausea, vomiting, fevers, chills, foam in urine.  Anxiety and depression Doing well on effexor 75 mg daily, no SE or concerns, gad7 and phq reviewed today GAD 7 : Generalized Anxiety Score 03/31/2020  Nervous, Anxious, on Edge 0  Control/stop worrying 0  Worry too much - different things 0  Trouble relaxing 0  Restless 0  Easily annoyed or irritable 0  Afraid - awful might happen 0  Total GAD 7 Score 0  Anxiety Difficulty Not difficult at all    Depression screen [facility] 2/9 03/31/2020 09/20/2019 09/27/2018  Decreased Interest 0 0 0  Down, Depressed, Hopeless 0 0 0  PHQ - 2 Score 0 0 0  Altered sleeping 0 0 0  Tired, decreased energy 0 0 0  Change in appetite 0 0 0  Feeling bad or failure about yourself  0 0 0  Trouble concentrating 0 0 0  Moving slowly or fidgety/restless 0 0 0  Suicidal thoughts 0 0 0  PHQ-9 Score 0 0 0  Difficult doing work/chores Not difficult at all Not difficult at all Not difficult at all   She did colonoscopy and had >20 polyps, due for 3 month f/up  with Dr. Allen Norris Colonoscopy and pathology report reviewed, no new or concerning GI sx or bowel changes   Current Outpatient Medications:  .  amLODipine (NORVASC) 5 MG tablet, Take 1 tablet (5 mg total) by mouth daily., Disp: 90 tablet, Rfl: 3 .  aspirin 81 MG tablet, Take 81 mg by mouth daily., Disp: , Rfl:  .  chlorthalidone (HYGROTON) 25 MG tablet, Take 25 mg by mouth daily., Disp: , Rfl:  .  Cholecalciferol (VITAMIN D3) 50 MCG (2000 UT) TABS, Take by mouth., Disp: , Rfl:  .  hydrochlorothiazide (HYDRODIURIL) 25 MG tablet, Take 1 tablet (25 mg total) by  mouth daily., Disp: 90 tablet, Rfl: 0 .  losartan (COZAAR) 100 MG tablet, Take 1 tablet (100 mg total) by mouth daily., Disp: 90 tablet, Rfl: 3 .  metoprolol succinate (TOPROL-XL) 50 MG 24 hr tablet, Take 1 tablet (50 mg total) by mouth daily. Take with or immediately following a meal., Disp: 90 tablet, Rfl: 3 .  Multiple Vitamins-Minerals (MULTIVITAMIN WOMEN PO), Take by mouth. Vitafusion for women gummies, Disp: , Rfl:  .  rosuvastatin (CRESTOR) 40 MG tablet, Take 1 tablet (40 mg total) by mouth every morning., Disp: 90 tablet, Rfl: 3 .  ticagrelor (BRILINTA) 60 MG TABS tablet, Take 60 mg by mouth 2 (two) times daily., Disp: , Rfl:  .  venlafaxine XR (EFFEXOR-XR) 75 MG 24 hr capsule, Take 1 capsule (75 mg total) by mouth daily with breakfast., Disp: 90 capsule, Rfl: 3 .  zinc gluconate 50 MG tablet, Take 50 mg by mouth daily., Disp: , Rfl:   Patient Active Problem List   Diagnosis Date Noted  . Polyp of descending colon   . Polyp of ascending colon   . Benign essential hypertension 07/10/2019  . Hematuria 07/10/2019  . Proteinuria 07/10/2019  . CIN I (cervical intraepithelial neoplasia I) 11/20/2018  . Abnormal Papanicolaou smear of cervix with positive human papilloma virus (HPV) test 03/27/2018  . Encounter for screening colonoscopy 03/26/2018  . Obesity (BMI 30.0-34.9) 07/30/2017  . Anxiety 10/31/2015  . Status post coronary artery stent placement 11/21/2014  . Hypertension   . Hyperlipidemia   . Panic attacks   . CAD (coronary artery disease)   . Tobacco use   . Tricuspid valve regurgitation   . MVP (mitral valve prolapse)   . Left adrenal mass ([place]) 07/16/2013  . Recurrent and persistent hematuria 05/02/2013    Past Surgical History:  Procedure Laterality Date  . COLONOSCOPY WITH PROPOFOL N/A 10/11/2019   Procedure: COLONOSCOPY WITH PROPOFOL;  Surgeon: Lucilla Lame, MD;  Location: [facility];  Service: Endoscopy;  Laterality: N/A;  priority 4  . CORONARY  ANGIOPLASTY WITH STENT PLACEMENT  2006   x1  . ENDOMETRIAL ABLATION  2012  . HERNIA REPAIR     at 7 yrs of age  . POLYPECTOMY  10/11/2019   Procedure: POLYPECTOMY;  Surgeon: Lucilla Lame, MD;  Location: [facility];  Service: Endoscopy;;  . TUBAL LIGATION  2012    Family History  Adopted: Yes  Family history unknown: Yes    Social History   Tobacco Use  . Smoking status: Current Every Day Smoker    Packs/day: 1.00    Years: 30.00    Pack years: 30.00    Types: Cigarettes  . Smokeless tobacco: Never Used  . Tobacco comment: 39 years  Vaping Use  . Vaping Use: Former  Substance Use Topics  . Alcohol use: Yes    Alcohol/week: 6.0 standard  drinks    Types: 6 Glasses of wine per week    Comment: Occasionally  . Drug use: No     Allergies  Allergen Reactions  . Dilaudid [Hydromorphone Hcl] Nausea And Vomiting  . Hydromorphone Nausea Only and Nausea And Vomiting    Health Maintenance  Topic Date Due  . COVID-19 Vaccine (1) Never done  . MAMMOGRAM  11/19/2013  . INFLUENZA VACCINE  Never done  . Hepatitis C Screening  09/19/2020 (Originally 1960-11-04)  . HIV Screening  09/19/2020 (Originally 10/20/1975)  . PAP SMEAR-Modifier  11/19/2021  . TETANUS/TDAP  11/16/2023  . COLONOSCOPY  10/10/2029  colonoscopy f/up 3 months with Allen Norris  Chart Review Today: I personally reviewed active problem list, medication list, allergies, family history, social history, health maintenance, notes from last encounter, lab results, imaging with the patient/caregiver today.'   Review of Systems  10 Systems reviewed and are negative for acute change except as noted in the HPI.  Objective:   Vitals:   03/31/20 1540  Pulse: 85  Resp: 16  Temp: 99.3 F (37.4 C)  TempSrc: Oral  SpO2: 99%  Weight: 192 lb 4.8 oz (87.2 kg)  Height: 5\' 5"  (1.651 m)    Body mass index is 32 kg/m.  Physical Exam Vitals and nursing note reviewed.  Constitutional:      General: She is not in  acute distress.    Appearance: Normal appearance. She is well-developed. She is obese. She is not ill-appearing, toxic-appearing or diaphoretic.     Interventions: Face mask in place.  HENT:     Head: Normocephalic and atraumatic.     Right Ear: External ear normal.     Left Ear: External ear normal.  Eyes:     General: Lids are normal. No scleral icterus.       Right eye: No discharge.        Left eye: No discharge.     Conjunctiva/sclera: Conjunctivae normal.  Neck:     Trachea: Phonation normal. No tracheal deviation.  Cardiovascular:     Rate and Rhythm: Normal rate and regular rhythm.     Pulses: Normal pulses.          Radial pulses are 2+ on the right side and 2+ on the left side.       Posterior tibial pulses are 2+ on the right side and 2+ on the left side.     Heart sounds: Normal heart sounds. No murmur heard.  No friction rub. No gallop.   Pulmonary:     Effort: Pulmonary effort is normal. No respiratory distress.     Breath sounds: Normal breath sounds. No stridor. No wheezing, rhonchi or rales.  Chest:     Chest wall: No tenderness.  Abdominal:     General: Bowel sounds are normal. There is no distension.     Palpations: Abdomen is soft.     Tenderness: There is no abdominal tenderness.  Musculoskeletal:     Right lower leg: No edema.     Left lower leg: No edema.  Skin:    General: Skin is warm and dry.     Coloration: Skin is not jaundiced or pale.     Findings: No rash.  Neurological:     Mental Status: She is alert.     Motor: No abnormal muscle tone.     Gait: Gait normal.  Psychiatric:        Mood and Affect: Mood normal.  Speech: Speech normal.        Behavior: Behavior normal.         Assessment & Plan:   1. Essential hypertension Stable, well controlled, BP at goal today Continue meds per cardiology - clarified meds with pharmacy today - on chlorthalidone  HCTZ taken off chart - COMPLETE METABOLIC PANEL WITH GFR  2. Mixed  hyperlipidemia On statin, no SE myalgias or concerns - COMPLETE METABOLIC PANEL WITH GFR - Lipid panel  3. Coronary artery disease due to lipid rich plaque Managed by cardiology, Dr. Humphrey Rolls, she is UTD with her f/up and does annual stress test No exertional sx - COMPLETE METABOLIC PANEL WITH GFR - Lipid panel  4. Microalbuminuria- may still need nephrology f/up - COMPLETE METABOLIC PANEL WITH GFR  5. Renal insufficiency monitoring - COMPLETE METABOLIC PANEL WITH GFR  6. Panic attacks Currently well controlled  7. Recurrent and persistent hematuria unchanged  8. Coronary artery disease involving native coronary artery of native heart, unspecified whether angina present On statin, metoprolol and brilinta seeing cardiology   9. Tobacco use 1/2 - 3/4 ppd, no interested in quitting  10. Status post coronary artery stent placement - Jan 2006 Stress test annually with Humphrey Rolls   11. Encounter for medication monitoring - CBC with Differential/Platelet - COMPLETE METABOLIC PANEL WITH GFR - Lipid panel  12. Class 1 obesity with serious comorbidity and body mass index (BMI) of 32.0 to 32.9 in adult, unspecified obesity type Gained weight back - going back on weight watcher after the 1st of the year Wt Readings from Last 5 Encounters:  03/31/20 192 lb 4.8 oz (87.2 kg)  10/11/19 186 lb (84.4 kg)  09/20/19 190 lb 3.2 oz (86.3 kg)  11/20/18 188 lb (85.3 kg)  04/17/18 188 lb (85.3 kg)   BMI Readings from Last 5 Encounters:  03/31/20 32.00 kg/m  10/11/19 30.72 kg/m  09/20/19 31.65 kg/m  11/20/18 31.28 kg/m  04/17/18 30.81 kg/m   Weight lower in 2019 170's  Due for mammogram - ordered x2    Return in about 6 months (around 09/28/2020) for Routine follow-up.   Delsa Grana, PA-C 03/31/20 4:29 PM

## 2020-03-31 NOTE — Patient Instructions (Signed)
Health Maintenance  Topic Date Due  . COVID-19 Vaccine (1) Never done  . Mammogram  11/19/2013  . Flu Shot  Never done  .  Hepatitis C: One time screening is recommended by Center for Disease Control  (CDC) for  adults born from [birth year] through 1963.   09/19/2020*  . HIV Screening  09/19/2020*  . Pap Smear  11/19/2021  . Tetanus Vaccine  11/16/2023  . Colon Cancer Screening  10/10/2029  *Topic was postponed. The date shown is not the original due date.   I will update your colon cancer screening dates when you do your follow up with Dr. Norberta Keens [facility] at [facility] [place],  [place]  [address] Get Driving Directions Main: [phone]   Call and schedule your mammogram when you have time!

## 2020-04-01 LAB — COMPLETE METABOLIC PANEL WITH GFR
AG Ratio: 1.8 (calc) (ref 1.0–2.5)
ALT: 27 U/L (ref 6–29)
AST: 22 U/L (ref 10–35)
Albumin: 4.2 g/dL (ref 3.6–5.1)
Alkaline phosphatase (APISO): 84 U/L (ref 37–153)
BUN/Creatinine Ratio: 17 (calc) (ref 6–22)
BUN: 21 mg/dL (ref 7–25)
CO2: 28 mmol/L (ref 20–32)
Calcium: 9.2 mg/dL (ref 8.6–10.4)
Chloride: 104 mmol/L (ref 98–110)
Creat: 1.22 mg/dL — ABNORMAL HIGH (ref 0.50–1.05)
GFR, Est African American: 57 mL/min/{1.73_m2} — ABNORMAL LOW (ref >=60)
GFR, Est Non African American: 49 mL/min/{1.73_m2} — ABNORMAL LOW (ref >=60)
Globulin: 2.4 g/dL (calc) (ref 1.9–3.7)
Glucose, Bld: 102 mg/dL — ABNORMAL HIGH (ref 65–99)
Potassium: 3.5 mmol/L (ref 3.5–5.3)
Sodium: 140 mmol/L (ref 135–146)
Total Bilirubin: 0.3 mg/dL (ref 0.2–1.2)
Total Protein: 6.6 g/dL (ref 6.1–8.1)

## 2020-04-01 LAB — CBC WITH DIFFERENTIAL/PLATELET
Absolute Monocytes: 798 cells/uL (ref 200–950)
Basophils Absolute: 32 cells/uL (ref 0–200)
Basophils Relative: 0.4 %
Eosinophils Absolute: 142 cells/uL (ref 15–500)
Eosinophils Relative: 1.8 %
HCT: 41.6 % (ref 35.0–45.0)
Hemoglobin: 14.1 g/dL (ref 11.7–15.5)
Lymphs Abs: 1920 cells/uL (ref 850–3900)
MCH: 32 pg (ref 27.0–33.0)
MCHC: 33.9 g/dL (ref 32.0–36.0)
MCV: 94.3 fL (ref 80.0–100.0)
MPV: 10.4 fL (ref 7.5–12.5)
Monocytes Relative: 10.1 %
Neutro Abs: 5009 cells/uL (ref 1500–7800)
Neutrophils Relative %: 63.4 %
Platelets: 217 10*3/uL (ref 140–400)
RBC: 4.41 10*6/uL (ref 3.80–5.10)
RDW: 13.1 % (ref 11.0–15.0)
Total Lymphocyte: 24.3 %
WBC: 7.9 10*3/uL (ref 3.8–10.8)

## 2020-04-01 LAB — LIPID PANEL
Cholesterol: 133 mg/dL (ref <200)
HDL: 47 mg/dL — ABNORMAL LOW (ref >=50)
LDL Cholesterol (Calc): 57 mg/dL (calc)
Non-HDL Cholesterol (Calc): 86 mg/dL (calc) (ref <130)
Total CHOL/HDL Ratio: 2.8 (calc) (ref <5.0)
Triglycerides: 218 mg/dL — ABNORMAL HIGH (ref <150)

## 2020-05-11 DIAGNOSIS — I2510 Atherosclerotic heart disease of native coronary artery without angina pectoris: Secondary | ICD-10-CM | POA: Diagnosis not present

## 2020-05-11 DIAGNOSIS — R0602 Shortness of breath: Secondary | ICD-10-CM | POA: Diagnosis not present

## 2020-05-11 DIAGNOSIS — Z9861 Coronary angioplasty status: Secondary | ICD-10-CM | POA: Diagnosis not present

## 2020-05-11 DIAGNOSIS — R69 Illness, unspecified: Secondary | ICD-10-CM | POA: Diagnosis not present

## 2020-05-11 DIAGNOSIS — E782 Mixed hyperlipidemia: Secondary | ICD-10-CM | POA: Diagnosis not present

## 2020-05-11 DIAGNOSIS — I10 Essential (primary) hypertension: Secondary | ICD-10-CM | POA: Diagnosis not present

## 2020-05-14 ENCOUNTER — Other Ambulatory Visit: Payer: Self-pay | Admitting: Family Medicine

## 2020-05-14 DIAGNOSIS — I10 Essential (primary) hypertension: Secondary | ICD-10-CM

## 2020-05-15 MED ORDER — VALSARTAN 160 MG PO TABS: 160.0000 mg | ORAL_TABLET | Freq: Every day | ORAL | 0 refills | Status: DC

## 2020-08-10 ENCOUNTER — Ambulatory Visit (INDEPENDENT_AMBULATORY_CARE_PROVIDER_SITE_OTHER): Payer: 59 | Admitting: Family Medicine

## 2020-08-10 ENCOUNTER — Encounter: Payer: Self-pay | Admitting: Family Medicine

## 2020-08-10 ENCOUNTER — Other Ambulatory Visit: Payer: Self-pay

## 2020-08-10 ENCOUNTER — Ambulatory Visit: Payer: Self-pay | Admitting: *Deleted

## 2020-08-10 ENCOUNTER — Ambulatory Visit: Payer: Self-pay

## 2020-08-10 VITALS — BP 132/72 | HR 89 | Temp 98.2°F | Resp 18 | Ht 65.0 in | Wt 189.3 lb

## 2020-08-10 DIAGNOSIS — L089 Local infection of the skin and subcutaneous tissue, unspecified: Secondary | ICD-10-CM

## 2020-08-10 MED ORDER — DOXYCYCLINE HYCLATE 100 MG PO TABS: 100.0000 mg | ORAL_TABLET | Freq: Two times a day (BID) | ORAL | 0 refills | Status: DC

## 2020-08-10 NOTE — Telephone Encounter (Signed)
Patient is calling to report she thought she had ingrown hair on leg- L lower calve. Patient reports it is now infected- red, painful-quarter size area and dime sizes drainage area. Patient is thinking it may now be a spider bite instead. Appointment scheduled. Reason for Disposition . Bite starts to look bad (e.g., blister, purplish skin, ulcer) (Exception:  just swelling or small red bump)  Answer Assessment - Initial Assessment Questions 1. TYPE of SPIDER: "What type of spider was it?"  (e.g., name, unknown, or brief description)     Unknown 2. LOCATION: "Where is the bite located?"      Inside of L calf 3. PAIN: "Is there any pain?" If Yes, ask: "How bad is it?"  (Scale 1-10; or mild, moderate, severe)     yes 4. SWELLING: "How big is the swelling?" (Inches, cm or compare to coins)      Red- quarter size- canter infection- dimes 5. ONSET: "When did the bite occur?" (Minutes or hours ago)      2 weekend ago 6. TETANUS: "When was the last tetanus booster?"      yes 7. OTHER SYMPTOMS: "Do you have any other symptoms?"  (e.g., muscle cramps, abdominal pain, change in urine color)     no  Protocols used: [place] AMERICA-A-AH

## 2020-08-10 NOTE — Progress Notes (Signed)
Name: VESPER TRANT   MRN: [id number]    DOB: 1960-09-24   Date:08/10/2020       Progress Note  Subjective  Chief Complaint  Acute- Left inner calf, possible spider bite  HPI  She noticed a small bump on left lower leg, looked like an ingrown hair about 2 weeks ago, last week she noticed some pain and erythema, yesterday it was very swollen and tender but later on it rupture and drained pus. Pain is not as intense today but there is an area of ulceration and mild clear to blood drainage. No fever or chills. She does not have diabetes. She is not allergic to any antibiotics  Patient Active Problem List   Diagnosis Date Noted  . Polyp of descending colon   . Polyp of ascending colon   . Benign essential hypertension 07/10/2019  . Hematuria 07/10/2019  . Proteinuria 07/10/2019  . CIN I (cervical intraepithelial neoplasia I) 11/20/2018  . Abnormal Papanicolaou smear of cervix with positive human papilloma virus (HPV) test 03/27/2018  . Encounter for screening colonoscopy 03/26/2018  . Obesity (BMI 30.0-34.9) 07/30/2017  . Anxiety 10/31/2015  . Status post coronary artery stent placement 11/21/2014  . Hypertension   . Hyperlipidemia   . Panic attacks   . CAD (coronary artery disease)   . Tobacco use   . Tricuspid valve regurgitation   . MVP (mitral valve prolapse)   . Left adrenal mass ([place]) 07/16/2013  . Recurrent and persistent hematuria 05/02/2013    Past Surgical History:  Procedure Laterality Date  . COLONOSCOPY WITH PROPOFOL N/A 10/11/2019   Procedure: COLONOSCOPY WITH PROPOFOL;  Surgeon: Lucilla Lame, MD;  Location: [facility];  Service: Endoscopy;  Laterality: N/A;  priority 4  . CORONARY ANGIOPLASTY WITH STENT PLACEMENT  2006   x1  . ENDOMETRIAL ABLATION  2012  . HERNIA REPAIR     at 7 yrs of age  . POLYPECTOMY  10/11/2019   Procedure: POLYPECTOMY;  Surgeon: Lucilla Lame, MD;  Location: [facility];  Service: Endoscopy;;  . TUBAL LIGATION  2012     Family History  Adopted: Yes  Family history unknown: Yes    Social History   Tobacco Use  . Smoking status: Current Every Day Smoker    Packs/day: 1.00    Years: 30.00    Pack years: 30.00    Types: Cigarettes  . Smokeless tobacco: Never Used  . Tobacco comment: 39 years  Substance Use Topics  . Alcohol use: Yes    Alcohol/week: 6.0 standard drinks    Types: 6 Glasses of wine per week    Comment: Occasionally     Current Outpatient Medications:  .  amLODipine (NORVASC) 5 MG tablet, Take 1 tablet (5 mg total) by mouth daily., Disp: 90 tablet, Rfl: 3 .  aspirin 81 MG tablet, Take 81 mg by mouth daily., Disp: , Rfl:  .  chlorthalidone (HYGROTON) 25 MG tablet, Take 1 tablet (25 mg total) by mouth daily., Disp: 90 tablet, Rfl: 3 .  Cholecalciferol (VITAMIN D3) 50 MCG (2000 UT) TABS, Take by mouth., Disp: , Rfl:  .  metoprolol succinate (TOPROL-XL) 50 MG 24 hr tablet, Take 1 tablet (50 mg total) by mouth daily. Take with or immediately following a meal., Disp: 90 tablet, Rfl: 3 .  Multiple Vitamins-Minerals (MULTIVITAMIN WOMEN PO), Take by mouth. Vitafusion for women gummies, Disp: , Rfl:  .  rosuvastatin (CRESTOR) 40 MG tablet, Take 1 tablet (40 mg total) by mouth every  morning., Disp: 90 tablet, Rfl: 3 .  ticagrelor (BRILINTA) 60 MG TABS tablet, Take 60 mg by mouth 2 (two) times daily., Disp: , Rfl:  .  valsartan (DIOVAN) 160 MG tablet, Take 1 tablet (160 mg total) by mouth daily., Disp: 90 tablet, Rfl: 0 .  venlafaxine XR (EFFEXOR-XR) 75 MG 24 hr capsule, Take 1 capsule (75 mg total) by mouth daily with breakfast., Disp: 90 capsule, Rfl: 3 .  zinc gluconate 50 MG tablet, Take 50 mg by mouth daily., Disp: , Rfl:   Allergies  Allergen Reactions  . Dilaudid [Hydromorphone Hcl] Nausea And Vomiting  . Hydromorphone Nausea Only and Nausea And Vomiting    I personally reviewed active problem list, medication list, allergies, family history, social history, health maintenance  with the patient/caregiver today.   ROS  Ten systems reviewed and is negative except as mentioned in HPI   Objective  Vitals:   08/10/20 1320  BP: 132/72  Pulse: 89  Resp: 18  Temp: 98.2 F (36.8 C)  TempSrc: Oral  SpO2: 99%  Weight: 189 lb 4.8 oz (85.9 kg)  Height: 5\' 5"  (1.651 m)    Body mass index is 31.5 kg/m.  Physical Exam  Constitutional: Patient appears well-developed and well-nourished. Obese  No distress.  HEENT: head atraumatic, normocephalic, pupils equal and reactive to light,  neck supple Cardiovascular: Normal rate, regular rhythm and normal heart sounds.  No murmur heard. No BLE edema. Pulmonary/Chest: Effort normal and breath sounds normal. No respiratory distress. Abdominal: Soft.  There is no tenderness. Skin: area of ulceration on left lower leg, some area of erythema surrounding, mild amount of drainage Psychiatric: Patient has a normal mood and affect. behavior is normal. Judgment and thought content normal.   PHQ2/9: Depression screen [facility] 2/9 08/10/2020 03/31/2020 09/20/2019 09/27/2018 03/26/2018  Decreased Interest 0 0 0 0 0  Down, Depressed, Hopeless 0 0 0 0 2  PHQ - 2 Score 0 0 0 0 2  Altered sleeping - 0 0 0 0  Tired, decreased energy - 0 0 0 0  Change in appetite - 0 0 0 0  Feeling bad or failure about yourself  - 0 0 0 0  Trouble concentrating - 0 0 0 0  Moving slowly or fidgety/restless - 0 0 0 0  Suicidal thoughts - 0 0 0 0  PHQ-9 Score - 0 0 0 2  Difficult doing work/chores - Not difficult at all Not difficult at all Not difficult at all Not difficult at all    phq 9 is negative   Fall Risk: Fall Risk  08/10/2020 03/31/2020 09/20/2019 09/27/2018 03/26/2018  Falls in the past year? 1 1 1  0 0  Number falls in past yr: 0 1 1 - 0  Injury with Fall? 0 1 1 - -  Risk for fall due to : History of fall(s) - - - -  Follow up Falls prevention discussed Falls evaluation completed - - -     Functional Status Survey: Is the patient deaf or  have difficulty hearing?: No Does the patient have difficulty seeing, even when wearing glasses/contacts?: No Does the patient have difficulty concentrating, remembering, or making decisions?: No Does the patient have difficulty walking or climbing stairs?: No Does the patient have difficulty dressing or bathing?: No Does the patient have difficulty doing errands alone such as visiting a doctor's office or shopping?: No    Assessment & Plan  1. Skin infection  - Anaerobic and Aerobic Culture - doxycycline (  VIBRA-TABS) 100 MG tablet; Take 1 tablet (100 mg total) by mouth 2 (two) times daily.  Dispense: 14 tablet; Refill: 0  Discussed possible MRSA, we will treat with antibiotics, sent for culture and if positive we will send muporicin to use on nostril, advised to wipe counters with clorox. Also discussed soaking area with warm water with some clorox bleach. May use 1/4 cup of bleach per full tub of water, less if half tub Keep area clean and may use a lose cover. Return if worsening or no improvement

## 2020-08-16 LAB — ANAEROBIC AND AEROBIC CULTURE
AER RESULT:: NO GROWTH
MICRO NUMBER:: 11758813
MICRO NUMBER:: 11758814
SPECIMEN QUALITY:: ADEQUATE
SPECIMEN QUALITY:: ADEQUATE

## 2020-09-12 ENCOUNTER — Other Ambulatory Visit: Payer: Self-pay | Admitting: Family Medicine

## 2020-09-29 ENCOUNTER — Encounter: Payer: Self-pay | Admitting: Unknown Physician Specialty

## 2020-09-29 ENCOUNTER — Telehealth: Payer: Self-pay

## 2020-09-29 ENCOUNTER — Ambulatory Visit (INDEPENDENT_AMBULATORY_CARE_PROVIDER_SITE_OTHER): Payer: 59 | Admitting: Unknown Physician Specialty

## 2020-09-29 ENCOUNTER — Other Ambulatory Visit: Payer: Self-pay

## 2020-09-29 VITALS — BP 124/78 | HR 87 | Temp 98.6°F | Resp 14 | Ht 65.0 in | Wt 188.7 lb

## 2020-09-29 DIAGNOSIS — E669 Obesity, unspecified: Secondary | ICD-10-CM

## 2020-09-29 DIAGNOSIS — F419 Anxiety disorder, unspecified: Secondary | ICD-10-CM | POA: Diagnosis not present

## 2020-09-29 DIAGNOSIS — Z0000 Encounter for general adult medical examination without abnormal findings: Secondary | ICD-10-CM

## 2020-09-29 DIAGNOSIS — K635 Polyp of colon: Secondary | ICD-10-CM

## 2020-09-29 DIAGNOSIS — I2510 Atherosclerotic heart disease of native coronary artery without angina pectoris: Secondary | ICD-10-CM | POA: Diagnosis not present

## 2020-09-29 DIAGNOSIS — N870 Mild cervical dysplasia: Secondary | ICD-10-CM

## 2020-09-29 DIAGNOSIS — E782 Mixed hyperlipidemia: Secondary | ICD-10-CM | POA: Diagnosis not present

## 2020-09-29 DIAGNOSIS — R69 Illness, unspecified: Secondary | ICD-10-CM | POA: Diagnosis not present

## 2020-09-29 DIAGNOSIS — Z720 Tobacco use: Secondary | ICD-10-CM | POA: Diagnosis not present

## 2020-09-29 DIAGNOSIS — I10 Essential (primary) hypertension: Secondary | ICD-10-CM | POA: Diagnosis not present

## 2020-09-29 MED ORDER — VENLAFAXINE HCL ER 75 MG PO CP24: 75.0000 mg | ORAL_CAPSULE | Freq: Every day | ORAL | 3 refills | Status: DC

## 2020-09-29 MED ORDER — AMLODIPINE BESYLATE 5 MG PO TABS: 5.0000 mg | ORAL_TABLET | Freq: Every day | ORAL | 3 refills | Status: DC

## 2020-09-29 MED ORDER — CHLORTHALIDONE 25 MG PO TABS: 25.0000 mg | ORAL_TABLET | Freq: Every day | ORAL | 3 refills | Status: DC

## 2020-09-29 MED ORDER — VALSARTAN 160 MG PO TABS: 160.0000 mg | ORAL_TABLET | Freq: Every day | ORAL | 1 refills | Status: DC

## 2020-09-29 MED ORDER — TICAGRELOR 60 MG PO TABS: 60.0000 mg | ORAL_TABLET | Freq: Two times a day (BID) | ORAL | 1 refills | Status: DC

## 2020-09-29 MED ORDER — ROSUVASTATIN CALCIUM 40 MG PO TABS: 40.0000 mg | ORAL_TABLET | ORAL | 3 refills | Status: DC

## 2020-09-29 MED ORDER — METOPROLOL SUCCINATE ER 50 MG PO TB24: 50.0000 mg | ORAL_TABLET | Freq: Every day | ORAL | 3 refills | Status: DC

## 2020-09-29 NOTE — Assessment & Plan Note (Signed)
Refer for lung CT screening.

## 2020-09-29 NOTE — Assessment & Plan Note (Signed)
Stable, continue present medications.   

## 2020-09-29 NOTE — Assessment & Plan Note (Signed)
Last pap was negative with Dr. Kenton Kingfisher.  Will resolve this diagnosis and f/u next year with pap and HPV

## 2020-09-29 NOTE — Telephone Encounter (Signed)
Order faxed to [facility]

## 2020-09-29 NOTE — Assessment & Plan Note (Signed)
Stable, continue present medications. Following with cardiology

## 2020-09-29 NOTE — Assessment & Plan Note (Signed)
Weight has been stable with no increase weight gain.

## 2020-09-29 NOTE — Patient Instructions (Signed)
Preventive Care 58-58 Years Old, Female Preventive care refers to lifestyle choices and visits with your health care provider that can promote health and wellness. This includes:  A yearly physical exam. This is also called an annual wellness visit.  Regular dental and eye exams.  Immunizations.  Screening for certain conditions.  Healthy lifestyle choices, such as: ? Eating a healthy diet. ? Getting regular exercise. ? Not using drugs or products that contain nicotine and tobacco. ? Limiting alcohol use. What can I expect for my preventive care visit? Physical exam Your health care provider will check your:  Height and weight. These may be used to calculate your BMI (body mass index). BMI is a measurement that tells if you are at a healthy weight.  Heart rate and blood pressure.  Body temperature.  Skin for abnormal spots. Counseling Your health care provider may ask you questions about your:  Past medical problems.  Family's medical history.  Alcohol, tobacco, and drug use.  Emotional well-being.  Home life and relationship well-being.  Sexual activity.  Diet, exercise, and sleep habits.  Work and work Statistician.  Access to firearms.  Method of birth control.  Menstrual cycle.  Pregnancy history. What immunizations do I need? Vaccines are usually given at various ages, according to a schedule. Your health care provider will recommend vaccines for you based on your age, medical history, and lifestyle or other factors, such as travel or where you work.   What tests do I need? Blood tests  Lipid and cholesterol levels. These may be checked every 5 years, or more often if you are over 58 years old.  Hepatitis C test.  Hepatitis B test. Screening  Lung cancer screening. You may have this screening every year starting at age 58 if you have a 30-pack-year history of smoking and currently smoke or have quit within the past 15 years.  Colorectal cancer  screening. ? All adults should have this screening starting at age 58 and continuing until age 17. ? Your health care provider may recommend screening at age 49 if you are at increased risk. ? You will have tests every 1-10 years, depending on your results and the type of screening test.  Diabetes screening. ? This is done by checking your blood sugar (glucose) after you have not eaten for a while (fasting). ? You may have this done every 1-3 years.  Mammogram. ? This may be done every 1-2 years. ? Talk with your health care provider about when you should start having regular mammograms. This may depend on whether you have a family history of breast cancer.  BRCA-related cancer screening. This may be done if you have a family history of breast, ovarian, tubal, or peritoneal cancers.  Pelvic exam and Pap test. ? This may be done every 3 years starting at age 58. ? Starting at age 58, this may be done every 5 years if you have a Pap test in combination with an HPV test. Other tests  STD (sexually transmitted disease) testing, if you are at risk.  Bone density scan. This is done to screen for osteoporosis. You may have this scan if you are at high risk for osteoporosis. Talk with your health care provider about your test results, treatment options, and if necessary, the need for more tests. Follow these instructions at home: Eating and drinking  Eat a diet that includes fresh fruits and vegetables, whole grains, lean protein, and low-fat dairy products.  Take vitamin and mineral supplements  as recommended by your health care provider.  Do not drink alcohol if: ? Your health care provider tells you not to drink. ? You are pregnant, may be pregnant, or are planning to become pregnant.  If you drink alcohol: ? Limit how much you have to 0-1 drink a day. ? Be aware of how much alcohol is in your drink. In the [place] one drink equals one 12 oz bottle of beer (355 mL), one 5 oz glass of  wine (148 mL), or one 1 oz glass of hard liquor (44 mL).   Lifestyle  Take daily care of your teeth and gums. Brush your teeth every morning and night with fluoride toothpaste. Floss one time each day.  Stay active. Exercise for at least 30 minutes 5 or more days each week.  Do not use any products that contain nicotine or tobacco, such as cigarettes, e-cigarettes, and chewing tobacco. If you need help quitting, ask your health care provider.  Do not use drugs.  If you are sexually active, practice safe sex. Use a condom or other form of protection to prevent STIs (sexually transmitted infections).  If you do not wish to become pregnant, use a form of birth control. If you plan to become pregnant, see your health care provider for a prepregnancy visit.  If told by your health care provider, take low-dose aspirin daily starting at age 50.  Find healthy ways to cope with stress, such as: ? Meditation, yoga, or listening to music. ? Journaling. ? Talking to a trusted person. ? Spending time with friends and family. Safety  Always wear your seat belt while driving or riding in a vehicle.  Do not drive: ? If you have been drinking alcohol. Do not ride with someone who has been drinking. ? When you are tired or distracted. ? While texting.  Wear a helmet and other protective equipment during sports activities.  If you have firearms in your house, make sure you follow all gun safety procedures. What's next?  Visit your health care provider once a year for an annual wellness visit.  Ask your health care provider how often you should have your eyes and teeth checked.  Stay up to date on all vaccines. This information is not intended to replace advice given to you by your health care provider. Make sure you discuss any questions you have with your health care provider. Document Revised: 01/21/2020 Document Reviewed: 12/28/2017 Elsevier Patient Education  2021 Elsevier Inc.  

## 2020-09-29 NOTE — Assessment & Plan Note (Signed)
Check labs today.

## 2020-09-29 NOTE — Progress Notes (Signed)
BP 124/78   Pulse 87   Temp 98.6 F (37 C) (Oral)   Resp 14   Ht 5\' 5"  (1.651 m)   Wt 188 lb 11.2 oz (85.6 kg)   SpO2 99%   BMI 31.40 kg/m    Subjective:    Patient ID: Maria Franco, female    DOB: [birth year], 60 y.o.   MRN: [id number]  HPI: Maria Franco is a 60 y.o. female  Chief Complaint  Patient presents with  . Annual Exam   Hypertension Using medications without difficulty Average home BPs   No problems or lightheadedness No chest pain with exertion or shortness of breath No Edema   Hyperlipidemia Using medications without problems: No Muscle aches  Diet compliance: Exercise:  Anxiety Stable on current medications.    Depression screen [facility] 2/9 09/29/2020 08/10/2020 03/31/2020 09/20/2019 09/27/2018  Decreased Interest 0 0 0 0 0  Down, Depressed, Hopeless 0 0 0 0 0  PHQ - 2 Score 0 0 0 0 0  Altered sleeping - - 0 0 0  Tired, decreased energy - - 0 0 0  Change in appetite - - 0 0 0  Feeling bad or failure about yourself  - - 0 0 0  Trouble concentrating - - 0 0 0  Moving slowly or fidgety/restless - - 0 0 0  Suicidal thoughts - - 0 0 0  PHQ-9 Score - - 0 0 0  Difficult doing work/chores - - Not difficult at all Not difficult at all Not difficult at all   Past Medical History:  Diagnosis Date  . Adrenal mass, left ([place])    followed by Endocrine, hormone testing q 5 years and CT q 1-2 years  . CAD (coronary artery disease)   . Depression   . Hyperlipidemia   . Hypertension    controlled on med  . MVP (mitral valve prolapse)   . Obesity   . Panic attacks   . Plantar fascia syndrome   . Presence of stent in LAD coronary artery   . 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 Sleep apnea    no CPAP  . Tricuspid valve regurgitation    Family History  Adopted: Yes  Family history unknown: Yes   Social History   Socioeconomic History  . Marital status: Married    Spouse name: Steven"todd"  . Number of children: Not on  file  . Years of education: 20  . Highest education level: Bachelor's degree (e.g., BA, AB, BS)  Occupational History  . Not on file  Tobacco Use  . Smoking status: Current Every Day Smoker    Packs/day: 1.00    Years: 30.00    Pack years: 30.00    Types: Cigarettes  . Smokeless tobacco: Never Used  . Tobacco comment: 39 years  Vaping Use  . Vaping Use: Former  Substance and Sexual Activity  . Alcohol use: Yes    Alcohol/week: 6.0 standard drinks    Types: 6 Glasses of wine per week    Comment: Occasionally  . Drug use: No  . Sexual activity: Yes  Other Topics Concern  . Not on file  Social History Narrative  . Not on file   Social Determinants of Health   Financial Resource Strain: Low Risk   . Difficulty of Paying Living Expenses: Not hard at all  Food Insecurity: No Food Insecurity  . Worried About Charity fundraiser in the Last Year: Never  true  . Ran Out of Food in the Last Year: Never true  Transportation Needs: No Transportation Needs  . Lack of Transportation (Medical): No  . Lack of Transportation (Non-Medical): No  Physical Activity: Insufficiently Active  . Days of Exercise per Week: 1 day  . Minutes of Exercise per Session: 10 min  Stress: No Stress Concern Present  . Feeling of Stress : Only a little  Social Connections: Moderately Isolated  . Frequency of Communication with Friends and Family: More than three times a week  . Frequency of Social Gatherings with Friends and Family: Once a week  . Attends Religious Services: Never  . Active Member of Clubs or Organizations: No  . Attends Archivist Meetings: Never  . Marital Status: Married    Relevant past medical, surgical, family and social history reviewed and updated as indicated. Interim medical history since our last visit reviewed. Allergies and medications reviewed and updated.  Review of Systems  All other systems reviewed and are negative.   Per HPI unless specifically  indicated above     Objective:    BP 124/78   Pulse 87   Temp 98.6 F (37 C) (Oral)   Resp 14   Ht 5\' 5"  (1.651 m)   Wt 188 lb 11.2 oz (85.6 kg)   SpO2 99%   BMI 31.40 kg/m   Wt Readings from Last 3 Encounters:  09/29/20 188 lb 11.2 oz (85.6 kg)  08/10/20 189 lb 4.8 oz (85.9 kg)  03/31/20 192 lb 4.8 oz (87.2 kg)    Physical Exam Constitutional:      Appearance: She is well-developed.  HENT:     Head: Normocephalic and atraumatic.  Eyes:     General: No scleral icterus.       Right eye: No discharge.        Left eye: No discharge.     Pupils: Pupils are equal, round, and reactive to light.  Neck:     Thyroid: No thyromegaly.     Vascular: No carotid bruit.  Cardiovascular:     Rate and Rhythm: Normal rate and regular rhythm.     Heart sounds: Normal heart sounds. No murmur heard. No friction rub. No gallop.   Pulmonary:     Effort: Pulmonary effort is normal. No respiratory distress.     Breath sounds: Normal breath sounds. No wheezing or rales.  Chest:  Breasts:     Right: Normal.     Left: Normal.    Abdominal:     General: Bowel sounds are normal.     Palpations: Abdomen is soft.     Tenderness: There is no abdominal tenderness. There is no rebound.  Musculoskeletal:        General: Normal range of motion.     Cervical back: Normal range of motion and neck supple.  Lymphadenopathy:     Cervical: No cervical adenopathy.  Skin:    General: Skin is warm and dry.     Findings: No rash.  Neurological:     Mental Status: She is alert and oriented to person, place, and time.  Psychiatric:        Speech: Speech normal.        Behavior: Behavior normal.        Thought Content: Thought content normal.        Judgment: Judgment normal.     Results for orders placed or performed in visit on 08/10/20  Anaerobic and Aerobic Culture  Result  Value Ref Range   MICRO NUMBER: [id number]    SPECIMEN QUALITY: Adequate    Source: ABSCESS    STATUS: FINAL    GRAM  STAIN:      No epithelial cells seen Rare Polymorphonuclear leukocytes No organisms seen   ANA RESULT: No anaerobes isolated.    MICRO NUMBER: [id number]    SPECIMEN QUALITY: Adequate    SOURCE: LEG,LEFT    STATUS: FINAL    AER RESULT: No Growth       Assessment & Plan:   Problem List Items Addressed This Visit      Unprioritized   Anxiety   Relevant Orders   MM Digital Diagnostic Bilat   CBC with Differential/Platelet   Comprehensive metabolic panel   Hepatitis C antibody   HIV Antibody (routine testing w rflx)   Lipid panel   Benign essential hypertension - Primary    Stable, continue present medications.        Relevant Orders   MM Digital Diagnostic Bilat   CBC with Differential/Platelet   Comprehensive metabolic panel   Hepatitis C antibody   HIV Antibody (routine testing w rflx)   Lipid panel   CAD (coronary artery disease) (Chronic)    Stable, continue present medications. Following with cardiology       RESOLVED: CIN I (cervical intraepithelial neoplasia I)    Last pap was negative with Dr. Kenton Kingfisher.  Will resolve this diagnosis and f/u next year with pap and HPV      Hyperlipidemia    Check labs today      RESOLVED: Hypertension (Chronic)   Relevant Orders   MM Digital Diagnostic Bilat   CBC with Differential/Platelet   Comprehensive metabolic panel   Hepatitis C antibody   HIV Antibody (routine testing w rflx)   Lipid panel   Obesity (BMI 30.0-34.9)    Weight has been stable with no increase weight gain.        Polyp of ascending colon   Relevant Orders   Ambulatory referral to Gastroenterology   Polyp of descending colon   Relevant Orders   Ambulatory referral to Gastroenterology   Tobacco use    Refer for lung CT screening.         Other Visit Diagnoses    Routine general medical examination at a health care facility       Relevant Orders   MM Digital Diagnostic Bilat   CBC with Differential/Platelet   Comprehensive metabolic panel    Hepatitis C antibody   HIV Antibody (routine testing w rflx)   Lipid panel   TSH      Update Hep B and HIV screening Order colonoscopy Order mammogram Refer for low dose CT Remains vaccine hesitant  Follow up plan: Return in about 6 months (around 03/31/2021).   Addendum: leg lesion.  Pointed out to me at the end of the visit.  Schedule a shave bx.

## 2020-09-29 NOTE — Telephone Encounter (Signed)
Pt wanted to remind you to put in a referral to [facility]  hospital

## 2020-09-30 LAB — COMPREHENSIVE METABOLIC PANEL
AG Ratio: 2 (calc) (ref 1.0–2.5)
ALT: 18 U/L (ref 6–29)
AST: 18 U/L (ref 10–35)
Albumin: 4.5 g/dL (ref 3.6–5.1)
Alkaline phosphatase (APISO): 68 U/L (ref 37–153)
BUN/Creatinine Ratio: 17 (calc) (ref 6–22)
BUN: 18 mg/dL (ref 7–25)
CO2: 30 mmol/L (ref 20–32)
Calcium: 10.1 mg/dL (ref 8.6–10.4)
Chloride: 104 mmol/L (ref 98–110)
Creat: 1.08 mg/dL — ABNORMAL HIGH (ref 0.50–1.05)
Globulin: 2.3 g/dL (calc) (ref 1.9–3.7)
Glucose, Bld: 97 mg/dL (ref 65–99)
Potassium: 4.7 mmol/L (ref 3.5–5.3)
Sodium: 144 mmol/L (ref 135–146)
Total Bilirubin: 0.3 mg/dL (ref 0.2–1.2)
Total Protein: 6.8 g/dL (ref 6.1–8.1)

## 2020-09-30 LAB — CBC WITH DIFFERENTIAL/PLATELET
Absolute Monocytes: 578 cells/uL (ref 200–950)
Basophils Absolute: 30 cells/uL (ref 0–200)
Basophils Relative: 0.4 %
Eosinophils Absolute: 120 cells/uL (ref 15–500)
Eosinophils Relative: 1.6 %
HCT: 44.4 % (ref 35.0–45.0)
Hemoglobin: 14.8 g/dL (ref 11.7–15.5)
Lymphs Abs: 1875 cells/uL (ref 850–3900)
MCH: 32.2 pg (ref 27.0–33.0)
MCHC: 33.3 g/dL (ref 32.0–36.0)
MCV: 96.7 fL (ref 80.0–100.0)
MPV: 10.5 fL (ref 7.5–12.5)
Monocytes Relative: 7.7 %
Neutro Abs: 4898 cells/uL (ref 1500–7800)
Neutrophils Relative %: 65.3 %
Platelets: 181 10*3/uL (ref 140–400)
RBC: 4.59 10*6/uL (ref 3.80–5.10)
RDW: 12.7 % (ref 11.0–15.0)
Total Lymphocyte: 25 %
WBC: 7.5 10*3/uL (ref 3.8–10.8)

## 2020-09-30 LAB — LIPID PANEL
Cholesterol: 122 mg/dL (ref <200)
HDL: 45 mg/dL — ABNORMAL LOW (ref >=50)
LDL Cholesterol (Calc): 49 mg/dL (calc)
Non-HDL Cholesterol (Calc): 77 mg/dL (calc) (ref <130)
Total CHOL/HDL Ratio: 2.7 (calc) (ref <5.0)
Triglycerides: 225 mg/dL — ABNORMAL HIGH (ref <150)

## 2020-09-30 LAB — HEPATITIS C ANTIBODY
Hepatitis C Ab: NONREACTIVE
SIGNAL TO CUT-OFF: 0 (ref <1.00)

## 2020-09-30 LAB — HIV ANTIBODY (ROUTINE TESTING W REFLEX): HIV 1&2 Ab, 4th Generation: NONREACTIVE

## 2020-09-30 LAB — TSH: TSH: 2.09 mIU/L (ref 0.40–4.50)

## 2020-10-13 ENCOUNTER — Other Ambulatory Visit: Payer: Self-pay

## 2020-10-13 ENCOUNTER — Encounter: Payer: Self-pay | Admitting: Unknown Physician Specialty

## 2020-10-13 ENCOUNTER — Encounter (INDEPENDENT_AMBULATORY_CARE_PROVIDER_SITE_OTHER): Payer: 59 | Admitting: Unknown Physician Specialty

## 2020-10-15 ENCOUNTER — Ambulatory Visit: Payer: 59 | Admitting: Unknown Physician Specialty

## 2020-10-23 ENCOUNTER — Telehealth: Payer: Self-pay | Admitting: *Deleted

## 2020-10-23 NOTE — Telephone Encounter (Signed)
Received referral for low dose lung cancer screening CT scan. Message left at phone number listed in EMR for patient to call [facility] to facilitate scheduling scan.

## 2020-11-13 DIAGNOSIS — I10 Essential (primary) hypertension: Secondary | ICD-10-CM | POA: Diagnosis not present

## 2020-11-13 DIAGNOSIS — R0602 Shortness of breath: Secondary | ICD-10-CM | POA: Diagnosis not present

## 2020-11-13 DIAGNOSIS — Z9861 Coronary angioplasty status: Secondary | ICD-10-CM | POA: Diagnosis not present

## 2020-11-13 DIAGNOSIS — E782 Mixed hyperlipidemia: Secondary | ICD-10-CM | POA: Diagnosis not present

## 2020-11-13 DIAGNOSIS — I2510 Atherosclerotic heart disease of native coronary artery without angina pectoris: Secondary | ICD-10-CM | POA: Diagnosis not present

## 2020-11-13 DIAGNOSIS — R69 Illness, unspecified: Secondary | ICD-10-CM | POA: Diagnosis not present

## 2021-01-25 ENCOUNTER — Other Ambulatory Visit: Payer: Self-pay

## 2021-01-25 ENCOUNTER — Ambulatory Visit: Payer: Self-pay | Admitting: Physician Assistant

## 2021-01-25 ENCOUNTER — Encounter: Payer: Self-pay | Admitting: Physician Assistant

## 2021-01-25 DIAGNOSIS — Z1152 Encounter for screening for COVID-19: Secondary | ICD-10-CM

## 2021-01-25 DIAGNOSIS — U071 COVID-19: Secondary | ICD-10-CM

## 2021-01-25 NOTE — Progress Notes (Signed)
   Subjective: COVID-19    Patient ID: Maria Franco, female    DOB: November 12, 1960, 60 y.o.   MRN: [id number]  HPI Patient tested positive for COVID-19.  Patient states except for chills and headache she is having no other symptoms.  Patient states she is explaining her normal "smoker's cough.  Review of patient records revealed that she was recommended to have a low dose CT screening test of her lungs.  This was annotated in June 2026.  Patient has not follow-up recommendation.  Review of Systems Anxiety, CAD, hyperlipidemia, hypertension, MVP, and tricuspid valve regurgitation.    Objective:   Physical Exam This is a virtual visit.  Noticed intermittent coughing during conversation.       Assessment & Plan: COVID-19.   Since patient has been vaccinated and with boosters recommend 5-day quarantine per [facility] recommendations.  Supportive care Tylenol for headache/fever/chills.  Call back to the clinic if condition deteriorates.

## 2021-01-25 NOTE — Progress Notes (Signed)
Pt started having fever/cold chills last night, woke up with head ache this morning and was advised to come get tested at clinic this morning.  Rapid: positive

## 2021-01-25 NOTE — Progress Notes (Signed)
Pt symptoms started last night having chills with no fever. This morning woke up with head ache. Pt is coughing but she does smoke./CL,RMA

## 2021-03-10 ENCOUNTER — Encounter: Payer: Self-pay | Admitting: Physician Assistant

## 2021-03-10 ENCOUNTER — Ambulatory Visit: Payer: Self-pay | Admitting: Physician Assistant

## 2021-03-10 DIAGNOSIS — J069 Acute upper respiratory infection, unspecified: Secondary | ICD-10-CM

## 2021-03-10 MED ORDER — PSEUDOEPH-BROMPHEN-DM 30-2-10 MG/5ML PO SYRP: 5.0000 mL | ORAL_SOLUTION | Freq: Four times a day (QID) | ORAL | 0 refills | Status: DC | PRN

## 2021-03-10 NOTE — Progress Notes (Signed)
Pt has coughed so much her stomach muscles are hurting. She stating the mucous in the back of her throat is very thick. She was waking up every hour last night with coughing fits. No fever.

## 2021-03-10 NOTE — Progress Notes (Signed)
   Subjective: Cough    Patient ID: Maria Franco, female    DOB: [birth year], 60 y.o.   MRN: [id number]  HPI Patient complains of nonproductive cough is worse in the past 3 days.  Patient states intermitting coughing started status post diagnosed with COVID approximately a month ago.  Patient state unable to sleep secondary to coughing spells.  Denies fever/chills or body aches.   Review of Systems Hyperlipidemia and hypertension.    Objective:   Physical Exam This is a virtual visit.       Assessment & Plan: Viral respiratory infection with cough   Patient is given a prescription for Bromfed-DM and advised to follow-up in 48 hours no improvement or worsening complaint.  Discussed rationale for not starting antibiotics at this time.  Will consider chest x-ray if no improvement in 48 hours.

## 2021-04-06 DIAGNOSIS — R0602 Shortness of breath: Secondary | ICD-10-CM | POA: Diagnosis not present

## 2021-04-06 DIAGNOSIS — E782 Mixed hyperlipidemia: Secondary | ICD-10-CM | POA: Diagnosis not present

## 2021-04-06 DIAGNOSIS — I2510 Atherosclerotic heart disease of native coronary artery without angina pectoris: Secondary | ICD-10-CM | POA: Diagnosis not present

## 2021-04-06 DIAGNOSIS — I10 Essential (primary) hypertension: Secondary | ICD-10-CM | POA: Diagnosis not present

## 2021-04-06 DIAGNOSIS — Z9861 Coronary angioplasty status: Secondary | ICD-10-CM | POA: Diagnosis not present

## 2021-04-06 DIAGNOSIS — R69 Illness, unspecified: Secondary | ICD-10-CM | POA: Diagnosis not present

## 2021-08-09 ENCOUNTER — Other Ambulatory Visit: Payer: Self-pay

## 2021-08-09 DIAGNOSIS — Z1231 Encounter for screening mammogram for malignant neoplasm of breast: Secondary | ICD-10-CM

## 2021-08-10 ENCOUNTER — Other Ambulatory Visit: Payer: Self-pay | Admitting: Unknown Physician Specialty

## 2021-08-11 NOTE — Telephone Encounter (Signed)
Appt scheduled for 08/19/21. ?

## 2021-08-11 NOTE — Telephone Encounter (Signed)
Requested Prescriptions  ?Pending Prescriptions Disp Refills  ?? valsartan (DIOVAN) 160 MG tablet [Pharmacy Med Name: VALSARTAN 160 MG TABLET] 30 tablet 0  ?  Sig: Take 1 tablet (160 mg total) by mouth daily.  ?  ? Cardiovascular:  Angiotensin Receptor Blockers Failed - 08/10/2021  2:27 PM  ?  ?  Failed - Cr in normal range and within 180 days  ?  Creat  ?Date Value Ref Range Status  ?09/29/2020 1.08 (H) 0.50 - 1.05 mg/dL Final  ?  Comment:  ?  For patients >59 years of age, the reference limit ?for Creatinine is approximately 13% higher for people ?identified as African-American. ?. ?  ? ?Creatinine, Urine  ?Date Value Ref Range Status  ?06/08/2018 95 20 - 275 mg/dL Final  ?   ?  ?  Failed - K in normal range and within 180 days  ?  Potassium  ?Date Value Ref Range Status  ?09/29/2020 4.7 3.5 - 5.3 mmol/L Final  ?   ?  ?  Failed - Valid encounter within last 6 months  ?  Recent Outpatient Visits   ?      ? 10 months ago   ? [facility] [place], Malachy Mood, NP  ? 10 months ago Benign essential hypertension  ? [facility] Kathrine Haddock, NP  ? 1 year ago Skin infection  ? [facility] Steele Sizer, MD  ? 1 year ago Essential hypertension  ? [facility] Delsa Grana, PA-C  ? 1 year ago Adult general medical exam  ? [facility] Delsa Grana, PA-C  ?  ?  ?Future Appointments   ?        ? In 1 week Delsa Grana, PA-C [facility] [place], [facility]  ?  ? ?  ?  ?  Passed - Patient is not pregnant  ?  ?  Passed - Last BP in normal range  ?  BP Readings from Last 1 Encounters:  ?10/13/20 126/78  ?   ?  ?  ? ? ?

## 2021-08-19 ENCOUNTER — Ambulatory Visit (INDEPENDENT_AMBULATORY_CARE_PROVIDER_SITE_OTHER): Payer: 59 | Admitting: Family Medicine

## 2021-08-19 ENCOUNTER — Encounter: Payer: Self-pay | Admitting: Family Medicine

## 2021-08-19 VITALS — BP 124/72 | HR 82 | Temp 98.6°F | Resp 18 | Ht 65.0 in | Wt 187.1 lb

## 2021-08-19 DIAGNOSIS — E669 Obesity, unspecified: Secondary | ICD-10-CM | POA: Diagnosis not present

## 2021-08-19 DIAGNOSIS — E782 Mixed hyperlipidemia: Secondary | ICD-10-CM

## 2021-08-19 DIAGNOSIS — Z7902 Long term (current) use of antithrombotics/antiplatelets: Secondary | ICD-10-CM

## 2021-08-19 DIAGNOSIS — Z5181 Encounter for therapeutic drug level monitoring: Secondary | ICD-10-CM | POA: Diagnosis not present

## 2021-08-19 DIAGNOSIS — Z7901 Long term (current) use of anticoagulants: Secondary | ICD-10-CM | POA: Diagnosis not present

## 2021-08-19 DIAGNOSIS — I2510 Atherosclerotic heart disease of native coronary artery without angina pectoris: Secondary | ICD-10-CM

## 2021-08-19 DIAGNOSIS — Z1231 Encounter for screening mammogram for malignant neoplasm of breast: Secondary | ICD-10-CM | POA: Diagnosis not present

## 2021-08-19 DIAGNOSIS — Z0000 Encounter for general adult medical examination without abnormal findings: Secondary | ICD-10-CM | POA: Diagnosis not present

## 2021-08-19 DIAGNOSIS — F419 Anxiety disorder, unspecified: Secondary | ICD-10-CM

## 2021-08-19 DIAGNOSIS — R69 Illness, unspecified: Secondary | ICD-10-CM | POA: Diagnosis not present

## 2021-08-19 DIAGNOSIS — I10 Essential (primary) hypertension: Secondary | ICD-10-CM | POA: Diagnosis not present

## 2021-08-19 LAB — CBC WITH DIFFERENTIAL/PLATELET
Absolute Monocytes: 698 cells/uL (ref 200–950)
Basophils Absolute: 30 cells/uL (ref 0–200)
Basophils Relative: 0.4 %
Eosinophils Absolute: 98 cells/uL (ref 15–500)
Eosinophils Relative: 1.3 %
HCT: 45.9 % — ABNORMAL HIGH (ref 35.0–45.0)
Hemoglobin: 15.5 g/dL (ref 11.7–15.5)
Lymphs Abs: 1523 cells/uL (ref 850–3900)
MCH: 32 pg (ref 27.0–33.0)
MCHC: 33.8 g/dL (ref 32.0–36.0)
MCV: 94.8 fL (ref 80.0–100.0)
MPV: 10 fL (ref 7.5–12.5)
Monocytes Relative: 9.3 %
Neutro Abs: 5153 cells/uL (ref 1500–7800)
Neutrophils Relative %: 68.7 %
Platelets: 195 10*3/uL (ref 140–400)
RBC: 4.84 10*6/uL (ref 3.80–5.10)
RDW: 12.9 % (ref 11.0–15.0)
Total Lymphocyte: 20.3 %
WBC: 7.5 10*3/uL (ref 3.8–10.8)

## 2021-08-19 LAB — COMPLETE METABOLIC PANEL WITH GFR
AG Ratio: 1.8 (calc) (ref 1.0–2.5)
ALT: 20 U/L (ref 6–29)
AST: 18 U/L (ref 10–35)
Albumin: 4.4 g/dL (ref 3.6–5.1)
Alkaline phosphatase (APISO): 70 U/L (ref 37–153)
BUN: 17 mg/dL (ref 7–25)
CO2: 31 mmol/L (ref 20–32)
Calcium: 9.7 mg/dL (ref 8.6–10.4)
Chloride: 102 mmol/L (ref 98–110)
Creat: 0.97 mg/dL (ref 0.50–1.03)
Globulin: 2.5 g/dL (calc) (ref 1.9–3.7)
Glucose, Bld: 95 mg/dL (ref 65–99)
Potassium: 3.5 mmol/L (ref 3.5–5.3)
Sodium: 141 mmol/L (ref 135–146)
Total Bilirubin: 0.4 mg/dL (ref 0.2–1.2)
Total Protein: 6.9 g/dL (ref 6.1–8.1)
eGFR: 68 mL/min/{1.73_m2} (ref >=60)

## 2021-08-19 LAB — LIPID PANEL
Cholesterol: 121 mg/dL (ref <200)
HDL: 49 mg/dL — ABNORMAL LOW (ref >=50)
LDL Cholesterol (Calc): 44 mg/dL (calc)
Non-HDL Cholesterol (Calc): 72 mg/dL (calc) (ref <130)
Total CHOL/HDL Ratio: 2.5 (calc) (ref <5.0)
Triglycerides: 222 mg/dL — ABNORMAL HIGH (ref <150)

## 2021-08-19 NOTE — Progress Notes (Signed)
? ?Name: Maria Franco   MRN: [id number]    DOB: 1961-02-17   Date:08/19/2021 ? ?     Progress Note ? ?Chief Complaint  ?Patient presents with  ? Follow-up  ? Medication Refill  ? ? ? ?Subjective:  ? ?Maria Franco is a 61 y.o. female, presents to clinic for  ? ?Hypertension:  ?Currently managed on valsartan 160, norvasc 5 chlorthalidone and metoprolol 50 mg (per cardiology) ?Pt reports good med compliance and denies any SE.   ?Blood pressure today is well controlled. ?BP Readings from Last 3 Encounters:  ?08/19/21 124/72  ?10/13/20 126/78  ?09/29/20 124/78  ? ?Pt denies CP, SOB, exertional sx, LE edema, palpitation, Ha's, visual disturbances, lightheadedness, hypotension, syncope. ? ? ?Cardiology - Dr. Humphrey Rolls  ?Stent - many years ago - states she sees him regularly and has had stress tests, ECHO, ECG (cannot find any recent records) ? ?Hyperlipidemia: ?Currently treated with crestor 40, pt reports good med compliance ?Last Lipids: ?Lab Results  ?Component Value Date  ? CHOL 122 09/29/2020  ? HDL 45 (L) 09/29/2020  ? LDLCALC 49 09/29/2020  ? TRIG 225 (H) 09/29/2020  ? CHOLHDL 2.7 09/29/2020  ? ?- Denies: Chest pain, shortness of breath, myalgias, claudication ? ?Would like to see a different cardiologist ? ? ? ? ? ?Current Outpatient Medications:  ?  valsartan (DIOVAN) 160 MG tablet, Take 1 tablet (160 mg total) by mouth daily., Disp: 30 tablet, Rfl: 0 ?  amLODipine (NORVASC) 5 MG tablet, Take 1 tablet (5 mg total) by mouth daily., Disp: 90 tablet, Rfl: 3 ?  amoxicillin (AMOXIL) 500 MG capsule, Take 1,000 mg by mouth 2 (two) times daily., Disp: , Rfl:  ?  aspirin 81 MG tablet, Take 81 mg by mouth daily., Disp: , Rfl:  ?  chlorthalidone (HYGROTON) 25 MG tablet, Take 1 tablet (25 mg total) by mouth daily., Disp: 90 tablet, Rfl: 3 ?  Cholecalciferol (VITAMIN D3) 50 MCG (2000 UT) TABS, Take by mouth., Disp: , Rfl:  ?  metoprolol succinate (TOPROL-XL) 50 MG 24 hr tablet, Take 1 tablet (50 mg total) by mouth daily. Take with  or immediately following a meal., Disp: 90 tablet, Rfl: 3 ?  Multiple Vitamins-Minerals (MULTIVITAMIN WOMEN PO), Take by mouth. Vitafusion for women gummies, Disp: , Rfl:  ?  nitroGLYCERIN (NITROSTAT) 0.4 MG SL tablet, Place under the tongue., Disp: , Rfl:  ?  rosuvastatin (CRESTOR) 40 MG tablet, Take 1 tablet (40 mg total) by mouth every morning., Disp: 90 tablet, Rfl: 3 ?  ticagrelor (BRILINTA) 60 MG TABS tablet, Take 1 tablet (60 mg total) by mouth 2 (two) times daily., Disp: 180 tablet, Rfl: 1 ?  venlafaxine XR (EFFEXOR-XR) 75 MG 24 hr capsule, Take 1 capsule (75 mg total) by mouth daily with breakfast., Disp: 90 capsule, Rfl: 3 ?  zinc gluconate 50 MG tablet, Take 50 mg by mouth daily., Disp: , Rfl:  ? ?Patient Active Problem List  ? Diagnosis Date Noted  ? Polyp of descending colon   ? Polyp of ascending colon   ? Benign essential hypertension 07/10/2019  ? Proteinuria 07/10/2019  ? Abnormal Papanicolaou smear of cervix with positive human papilloma virus (HPV) test 03/27/2018  ? Encounter for screening colonoscopy 03/26/2018  ? Obesity (BMI 30.0-34.9) 07/30/2017  ? Anxiety 10/31/2015  ? Status post coronary artery stent placement 11/21/2014  ? Hyperlipidemia   ? Panic attacks   ? CAD (coronary artery disease)   ? Tobacco use   ?  Tricuspid valve regurgitation   ? MVP (mitral valve prolapse)   ? Left adrenal mass ([place]) 07/16/2013  ? ? ?Past Surgical History:  ?Procedure Laterality Date  ? COLONOSCOPY WITH PROPOFOL N/A 10/11/2019  ? Procedure: COLONOSCOPY WITH PROPOFOL;  Surgeon: Lucilla Lame, MD;  Location: [place] [facility];  Service: Endoscopy;  Laterality: N/A;  priority 4  ? CORONARY ANGIOPLASTY WITH STENT PLACEMENT  2006  ? x1  ? ENDOMETRIAL ABLATION  2012  ? HERNIA REPAIR    ? at 4 yrs of age  ? POLYPECTOMY  10/11/2019  ? Procedure: POLYPECTOMY;  Surgeon: Lucilla Lame, MD;  Location: [facility];  Service: Endoscopy;;  ? TUBAL LIGATION  2012  ? ? ?Family History  ?Adopted: Yes  ?Family history  unknown: Yes  ? ? ?Social History  ? ?Tobacco Use  ? Smoking status: Every Day  ?  Packs/day: 1.00  ?  Years: 30.00  ?  Pack years: 30.00  ?  Types: Cigarettes  ? Smokeless tobacco: Never  ? Tobacco comments:  ?  39 years  ?Vaping Use  ? Vaping Use: Former  ?Substance Use Topics  ? Alcohol use: Yes  ?  Alcohol/week: 6.0 standard drinks  ?  Types: 6 Glasses of wine per week  ?  Comment: Occasionally  ? Drug use: No  ?  ? ?Allergies  ?Allergen Reactions  ? Dilaudid [Hydromorphone Hcl] Nausea And Vomiting  ? Hydromorphone Nausea Only and Nausea And Vomiting  ? ? ?Health Maintenance  ?Topic Date Due  ? MAMMOGRAM  11/19/2013  ? COVID-19 Vaccine (1) 09/04/2021 (Originally 04/22/1961)  ? PAP SMEAR-Modifier  11/19/2021  ? INFLUENZA VACCINE  11/30/2021  ? TETANUS/TDAP  11/16/2023  ? COLONOSCOPY (Pts 45-43yr Insurance coverage will need to be confirmed)  10/10/2029  ? Hepatitis C Screening  Completed  ? HIV Screening  Completed  ? Zoster Vaccines- Shingrix  Completed  ? HPV VACCINES  Aged Out  ? ? ?Chart Review Today: ?I personally reviewed active problem list, medication list, allergies, family history, social history, health maintenance, notes from last encounter, lab results, imaging with the patient/caregiver today. ? ? ?Review of Systems  ?Constitutional: Negative.   ?HENT: Negative.    ?Eyes: Negative.   ?Respiratory: Negative.    ?Cardiovascular: Negative.   ?Gastrointestinal: Negative.   ?Endocrine: Negative.   ?Genitourinary: Negative.   ?Musculoskeletal: Negative.   ?Skin: Negative.   ?Allergic/Immunologic: Negative.   ?Neurological: Negative.   ?Hematological: Negative.   ?Psychiatric/Behavioral: Negative.    ?All other systems reviewed and are negative.  ? ?Objective:  ? ?Vitals:  ? 08/19/21 0850  ?BP: 124/72  ?Pulse: 82  ?Resp: 18  ?Temp: 98.6 ?F (37 ?C)  ?TempSrc: Oral  ?SpO2: 98%  ?Weight: 187 lb 1.6 oz (84.9 kg)  ?Height: '5\' 5"'$  (1.651 m)  ? ? ?Body mass index is 31.14 kg/m?. ? ?Physical Exam ?Vitals and  nursing note reviewed.  ?Constitutional:   ?   General: She is not in acute distress. ?   Appearance: Normal appearance. She is well-developed. She is obese. She is not ill-appearing, toxic-appearing or diaphoretic.  ?   Interventions: Face mask in place.  ?HENT:  ?   Head: Normocephalic and atraumatic.  ?   Right Ear: External ear normal.  ?   Left Ear: External ear normal.  ?Eyes:  ?   General: Lids are normal. No scleral icterus.    ?   Right eye: No discharge.     ?   Left  eye: No discharge.  ?   Conjunctiva/sclera: Conjunctivae normal.  ?Neck:  ?   Trachea: Phonation normal. No tracheal deviation.  ?Cardiovascular:  ?   Rate and Rhythm: Normal rate and regular rhythm.  ?   Pulses: Normal pulses.     ?     Radial pulses are 2+ on the right side and 2+ on the left side.  ?     Posterior tibial pulses are 2+ on the right side and 2+ on the left side.  ?   Heart sounds: Normal heart sounds. No murmur heard. ?  No friction rub. No gallop.  ?Pulmonary:  ?   Effort: Pulmonary effort is normal. No respiratory distress.  ?   Breath sounds: Normal breath sounds. No stridor. No wheezing, rhonchi or rales.  ?Chest:  ?   Chest wall: No tenderness.  ?Abdominal:  ?   General: Bowel sounds are normal. There is no distension.  ?   Palpations: Abdomen is soft.  ?Musculoskeletal:  ?   Right lower leg: No edema.  ?   Left lower leg: No edema.  ?Skin: ?   General: Skin is warm and dry.  ?   Coloration: Skin is not jaundiced or pale.  ?   Findings: No bruising or rash.  ?Neurological:  ?   Mental Status: She is alert. Mental status is at baseline.  ?   Motor: No abnormal muscle tone.  ?   Gait: Gait normal.  ?Psychiatric:     ?   Mood and Affect: Mood normal.     ?   Speech: Speech normal.     ?   Behavior: Behavior normal.  ?  ? ? ? ? ?Assessment & Plan:  ? ?Pt presents for f/up, is going to do labs for CPE but return to complete the CPE ? ?  ICD-10-CM   ?1. Benign essential hypertension  H41 COMPLETE METABOLIC PANEL WITH GFR  ?   valsartan (DIOVAN) 160 MG tablet  ?  amLODipine (NORVASC) 5 MG tablet  ?  chlorthalidone (HYGROTON) 25 MG tablet  ?  Ambulatory referral to Cardiology  ? BP at goal, well controlled, check renal function and el

## 2021-08-27 MED ORDER — CHLORTHALIDONE 25 MG PO TABS: 25.0000 mg | ORAL_TABLET | Freq: Every day | ORAL | 3 refills | Status: DC

## 2021-08-27 MED ORDER — METOPROLOL SUCCINATE ER 50 MG PO TB24: 50.0000 mg | ORAL_TABLET | Freq: Every day | ORAL | 3 refills | Status: DC

## 2021-08-27 MED ORDER — VENLAFAXINE HCL ER 75 MG PO CP24: 75.0000 mg | ORAL_CAPSULE | Freq: Every day | ORAL | 3 refills | Status: DC

## 2021-08-27 MED ORDER — AMLODIPINE BESYLATE 5 MG PO TABS: 5.0000 mg | ORAL_TABLET | Freq: Every day | ORAL | 3 refills | Status: DC

## 2021-08-27 MED ORDER — VALSARTAN 160 MG PO TABS: 160.0000 mg | ORAL_TABLET | Freq: Every day | ORAL | 3 refills | Status: DC

## 2021-08-27 MED ORDER — ROSUVASTATIN CALCIUM 40 MG PO TABS: 40.0000 mg | ORAL_TABLET | ORAL | 3 refills | Status: DC

## 2021-09-06 DIAGNOSIS — I2510 Atherosclerotic heart disease of native coronary artery without angina pectoris: Secondary | ICD-10-CM | POA: Diagnosis not present

## 2021-09-06 DIAGNOSIS — I351 Nonrheumatic aortic (valve) insufficiency: Secondary | ICD-10-CM | POA: Diagnosis not present

## 2021-09-06 DIAGNOSIS — E669 Obesity, unspecified: Secondary | ICD-10-CM | POA: Diagnosis not present

## 2021-09-06 DIAGNOSIS — I10 Essential (primary) hypertension: Secondary | ICD-10-CM | POA: Diagnosis not present

## 2021-09-06 DIAGNOSIS — E782 Mixed hyperlipidemia: Secondary | ICD-10-CM | POA: Diagnosis not present

## 2021-10-01 ENCOUNTER — Encounter: Payer: Self-pay | Admitting: Family Medicine

## 2021-10-01 ENCOUNTER — Other Ambulatory Visit (HOSPITAL_COMMUNITY)
Admission: RE | Admit: 2021-10-01 | Discharge: 2021-10-01 | Disposition: A | Discharge status: Home / Self Care | Payer: 59 | Source: Ambulatory Visit | Attending: Family Medicine | Admitting: Family Medicine

## 2021-10-01 ENCOUNTER — Ambulatory Visit (INDEPENDENT_AMBULATORY_CARE_PROVIDER_SITE_OTHER): Payer: 59 | Admitting: Family Medicine

## 2021-10-01 VITALS — BP 126/74 | HR 94 | Temp 98.1°F | Resp 16 | Ht 65.0 in | Wt 190.1 lb

## 2021-10-01 DIAGNOSIS — Z0000 Encounter for general adult medical examination without abnormal findings: Secondary | ICD-10-CM | POA: Diagnosis not present

## 2021-10-01 DIAGNOSIS — Z124 Encounter for screening for malignant neoplasm of cervix: Secondary | ICD-10-CM

## 2021-10-01 DIAGNOSIS — Z1211 Encounter for screening for malignant neoplasm of colon: Secondary | ICD-10-CM

## 2021-10-01 NOTE — Progress Notes (Signed)
Patient: Maria Franco, Female    DOB: [birth year], 61 y.o.   MRN: [id number] Delsa Grana, PA-C Visit Date: 10/01/2021  Today's Provider: Delsa Grana, PA-C   Chief Complaint  Patient presents with   Annual Exam   Subjective:   Annual physical exam:  Maria Franco is a 61 y.o. female who presents today for complete physical exam:  Exercise/Activity:  not active Diet/nutrition:     no efforts Sleep:  no concerns    SDOH Screenings   Alcohol Screen: Low Risk    Last Alcohol Screening Score (AUDIT): 0  Depression (PHQ2-9): Low Risk    PHQ-2 Score: 0  Financial Resource Strain: Low Risk    Difficulty of Paying Living Expenses: Not hard at all  Food Insecurity: No Food Insecurity   Worried About Charity fundraiser in the Last Year: Never true   Ran Out of Food in the Last Year: Never true  Housing: Low Risk    Last Housing Risk Score: 0  Physical Activity: Inactive   Days of Exercise per Week: 0 days   Minutes of Exercise per Session: 0 min  Social Connections: Moderately Isolated   Frequency of Communication with Friends and Family: More than three times a week   Frequency of Social Gatherings with Friends and Family: Once a week   Attends Religious Services: Never   Marine scientist or Organizations: No   Attends Archivist Meetings: Never   Marital Status: Married  Stress: No Stress Concern Present   Feeling of Stress : Only a little  Tobacco Use: High Risk   Smoking Tobacco Use: Every Day   Smokeless Tobacco Use: Never   Passive Exposure: Not on file  Transportation Needs: No Transportation Needs   Lack of Transportation (Medical): No   Lack of Transportation (Non-Medical): No      USPSTF grade A and B recommendations - reviewed and addressed today  Depression:  Phq 9 completed today by patient, was reviewed by me with patient in the room PHQ score is neg, pt feels reviewed    10/01/2021    3:17 PM 08/19/2021    8:53 AM 10/13/2020    3:39  PM 09/29/2020    9:22 AM  PHQ 2/9 Scores  PHQ - 2 Score 0 0 0 0  PHQ- 9 Score 0  0       10/01/2021    3:17 PM 08/19/2021    8:53 AM 10/13/2020    3:39 PM 09/29/2020    9:22 AM 08/10/2020    1:24 PM  Depression screen PHQ 2/9  Decreased Interest 0 0 0 0 0  Down, Depressed, Hopeless 0 0 0 0 0  PHQ - 2 Score 0 0 0 0 0  Altered sleeping 0  0    Tired, decreased energy 0  0    Change in appetite 0  0    Feeling bad or failure about yourself  0  0    Trouble concentrating 0  0    Moving slowly or fidgety/restless 0  0    Suicidal thoughts 0  0    PHQ-9 Score 0  0    Difficult doing work/chores   Not difficult at all      Alcohol screening: [place] Office Visit [facility]/13/[facility]  AUDIT-C Score 0       Immunizations and Health Maintenance: Health Maintenance  Topic Date Due   MAMMOGRAM  11/19/2013   PAP SMEAR-Modifier  11/19/2021   COVID-19 Vaccine (1) 10/17/2021 (Originally 04/22/1961)   INFLUENZA VACCINE  11/30/2021   TETANUS/TDAP  11/16/2023   COLONOSCOPY (Pts 45-55yr Insurance coverage will need to be confirmed)  10/10/2029   Hepatitis C Screening  Completed   HIV Screening  Completed   Zoster Vaccines- Shingrix  Completed   HPV VACCINES  Aged Out     Hep C Screening: done  STD testing and prevention (HIV/chl/gon/syphilis):  see above, no additional testing desired by pt today - done previously and monogamous  Intimate partner violence: safe   Sexual History/Pain during Intercourse: Married  Menstrual History/LMP/Abnormal Bleeding:   no AUB No LMP recorded. Patient has had an ablation.  Incontinence Symptoms:  none  Breast cancer:  Last Mammogram: *see HM list above BRCA gene screening:   Cervical cancer screening: done today  Unknown family hx  Osteoporosis:   Discussion on osteoporosis per age, including high calcium and vitamin D supplementation, weight bearing exercises Pt is supplementing with daily calcium/Vit  D.  Skin cancer:  Hx of skin CA -  NO Discussed atypical lesions   Colorectal cancer:   Colonoscopy is due - f/up with [facility] - WAS DUE FOR 3 MONTH F/UP COLONOSCOPY AFTER COLONOSCOPY 2 YEARS AGO Discussed concerning signs and sx of CRC, pt denies bowel changes blood in stool  Lung cancer:   Low Dose CT Chest recommended if Age 59-80years, 20 pack-year currently smoking OR have quit w/in 15years. Patient does qualify.  She is not interested in doing lung CA screening  Social History   Tobacco Use   Smoking status: Every Day    Packs/day: 1.00    Years: 30.00    Pack years: 30.00    Types: Cigarettes   Smokeless tobacco: Never   Tobacco comments:    39 years  Vaping Use   Vaping Use: Former  Substance Use Topics   Alcohol use: Yes    Alcohol/week: 6.0 standard drinks    Types: 6 Glasses of wine per week    Comment: Occasionally   Drug use: No     Flowsheet Row Office [facility] AUDIT-C Score 0       Family History  Adopted: Yes  Family history unknown: Yes     Blood pressure/Hypertension: BP Readings from Last 3 Encounters:  10/01/21 126/74  08/19/21 124/72  10/13/20 126/78    Weight/Obesity: Wt Readings from Last 3 Encounters:  10/01/21 190 lb 1.6 oz (86.2 kg)  08/19/21 187 lb 1.6 oz (84.9 kg)  10/13/20 191 lb 3.2 oz (86.7 kg)   BMI Readings from Last 3 Encounters:  10/01/21 31.63 kg/m  08/19/21 31.14 kg/m  10/13/20 31.82 kg/m     Lipids:  Lab Results  Component Value Date   CHOL 121 08/19/2021   CHOL 122 09/29/2020   CHOL 133 03/31/2020   Lab Results  Component Value Date   HDL 49 (L) 08/19/2021   HDL 45 (L) 09/29/2020   HDL 47 (L) 03/31/2020   Lab Results  Component Value Date   LDLCALC 44 08/19/2021   LDLCALC 49 09/29/2020   LDLCALC 57 03/31/2020   Lab Results  Component Value Date   TRIG 222 (H) 08/19/2021   TRIG 225 (H) 09/29/2020   TRIG 218 (H) 03/31/2020   Lab Results   Component Value Date   CHOLHDL 2.5 08/19/2021   CHOLHDL 2.7 09/29/2020   CHOLHDL 2.8 03/31/2020  No results found for: LDLDIRECT Based on the results of lipid panel his/her cardiovascular risk factor ( using [facility] )  in the next 10 years is: The ASCVD Risk score (Arnett DK, et al., 2019) failed to calculate for the following reasons:   The valid total cholesterol range is 130 to 320 mg/dL  Glucose:  Glucose, Bld  Date Value Ref Range Status  08/19/2021 95 65 - 99 mg/dL Final    Comment:    .            Fasting reference interval .   09/29/2020 97 65 - 99 mg/dL Final    Comment:    .            Fasting reference interval .   03/31/2020 102 (H) 65 - 99 mg/dL Final    Comment:    .            Fasting reference interval . For someone without known diabetes, a glucose value between 100 and 125 mg/dL is consistent with prediabetes and should be confirmed with a follow-up test. .     Advanced Care Planning:  A voluntary discussion about advance care planning including the explanation and discussion of advance directives.   Discussed health care proxy and Living will, and the patient was able to identify a health care proxy as spouse.     Social History       Social History   Socioeconomic History   Marital status: Married    Spouse name: Steven"todd"   Number of children: Not on file   Years of education: 12   Highest education level: Bachelor's degree (e.g., BA, AB, BS)  Occupational History   Not on file  Tobacco Use   Smoking status: Every Day    Packs/day: 1.00    Years: 30.00    Pack years: 30.00    Types: Cigarettes   Smokeless tobacco: Never   Tobacco comments:    39 years  Vaping Use   Vaping Use: Former  Substance and Sexual Activity   Alcohol use: Yes    Alcohol/week: 6.0 standard drinks    Types: 6 Glasses of wine per week    Comment: Occasionally   Drug use: No   Sexual activity: Yes  Other Topics Concern   Not on file  Social  History Narrative   Not on file   Social Determinants of Health   Financial Resource Strain: Low Risk    Difficulty of Paying Living Expenses: Not hard at all  Food Insecurity: No Food Insecurity   Worried About Charity fundraiser in the Last Year: Never true   [place] in the Last Year: Never true  Transportation Needs: No Transportation Needs   Lack of Transportation (Medical): No   Lack of Transportation (Non-Medical): No  Physical Activity: Inactive   Days of Exercise per Week: 0 days   Minutes of Exercise per Session: 0 min  Stress: No Stress Concern Present   Feeling of Stress : Only a little  Social Connections: Moderately Isolated   Frequency of Communication with Friends and Family: More than three times a week   Frequency of Social Gatherings with Friends and Family: Once a week   Attends Religious Services: Never   Marine scientist or Organizations: No   Attends Archivist Meetings: Never   Marital Status: Married    Family History        Family History  Adopted:  Yes  Family history unknown: Yes    Patient Active Problem List   Diagnosis Date Noted   Polyp of descending colon    Polyp of ascending colon    Benign essential hypertension 07/10/2019   Proteinuria 07/10/2019   Abnormal Papanicolaou smear of cervix with positive human papilloma virus (HPV) test 03/27/2018   Encounter for screening colonoscopy 03/26/2018   Obesity (BMI 30.0-34.9) 07/30/2017   Anxiety 10/31/2015   Status post coronary artery stent placement 11/21/2014   Hyperlipidemia    Panic attacks    CAD (coronary artery disease)    Tobacco use    Tricuspid valve regurgitation    MVP (mitral valve prolapse)    Left adrenal mass ([place]) 07/16/2013    Past Surgical History:  Procedure Laterality Date   COLONOSCOPY WITH PROPOFOL N/A 10/11/2019   Procedure: COLONOSCOPY WITH PROPOFOL;  Surgeon: Lucilla Lame, MD;  Location: [place];  Service: Endoscopy;   Laterality: N/A;  priority 4   CORONARY ANGIOPLASTY WITH STENT PLACEMENT  2006   x1   ENDOMETRIAL ABLATION  2012   HERNIA REPAIR     at 7 yrs of age   POLYPECTOMY  10/11/2019   Procedure: POLYPECTOMY;  Surgeon: Lucilla Lame, MD;  Location: [place];  Service: Endoscopy;;   TUBAL LIGATION  2012     Current Outpatient Medications:    amLODipine (NORVASC) 5 MG tablet, Take 1 tablet (5 mg total) by mouth daily., Disp: 90 tablet, Rfl: 3   aspirin 81 MG tablet, Take 81 mg by mouth daily., Disp: , Rfl:    chlorthalidone (HYGROTON) 25 MG tablet, Take 1 tablet (25 mg total) by mouth daily., Disp: 90 tablet, Rfl: 3   Cholecalciferol (VITAMIN D3) 50 MCG (2000 UT) TABS, Take by mouth., Disp: , Rfl:    metoprolol succinate (TOPROL-XL) 50 MG 24 hr tablet, Take 1 tablet (50 mg total) by mouth daily. Take with or immediately following a meal., Disp: 90 tablet, Rfl: 3   Multiple Vitamins-Minerals (MULTIVITAMIN WOMEN PO), Take by mouth. Vitafusion for women gummies, Disp: , Rfl:    nitroGLYCERIN (NITROSTAT) 0.4 MG SL tablet, Place under the tongue., Disp: , Rfl:    oxyCODONE-acetaminophen (PERCOCET/ROXICET) 5-325 MG tablet, Take by mouth., Disp: , Rfl:    rosuvastatin (CRESTOR) 40 MG tablet, Take 1 tablet (40 mg total) by mouth every morning., Disp: 90 tablet, Rfl: 3   ticagrelor (BRILINTA) 60 MG TABS tablet, Take 1 tablet (60 mg total) by mouth 2 (two) times daily., Disp: 180 tablet, Rfl: 1   valsartan (DIOVAN) 160 MG tablet, Take 1 tablet (160 mg total) by mouth daily., Disp: 90 tablet, Rfl: 3   venlafaxine XR (EFFEXOR-XR) 75 MG 24 hr capsule, Take 1 capsule (75 mg total) by mouth daily with breakfast., Disp: 90 capsule, Rfl: 3   zinc gluconate 50 MG tablet, Take 50 mg by mouth daily., Disp: , Rfl:    amoxicillin (AMOXIL) 500 MG capsule, Take 1,000 mg by mouth 2 (two) times daily. (Patient not taking: Reported on 10/01/2021), Disp: , Rfl:   Allergies  Allergen Reactions   Dilaudid  [Hydromorphone Hcl] Nausea And Vomiting   Hydromorphone Nausea Only and Nausea And Vomiting    Patient Care Team: Delsa Grana, PA-C as PCP - General (Family Medicine) Dionisio David, MD as Consulting Physician (Cardiology) Philemon Kingdom, MD as Consulting Physician (Endocrinology) Collier Flowers, MD as Referring Physician (Urology)   Chart Review: I personally reviewed active problem list, medication list, allergies, family  history, social history, health maintenance, notes from last encounter, lab results, imaging with the patient/caregiver today.   Review of Systems  Constitutional: Negative.   HENT: Negative.    Eyes: Negative.   Respiratory: Negative.    Cardiovascular: Negative.   Gastrointestinal: Negative.   Endocrine: Negative.   Genitourinary: Negative.   Musculoskeletal: Negative.   Skin: Negative.   Allergic/Immunologic: Negative.   Neurological: Negative.   Hematological: Negative.   Psychiatric/Behavioral: Negative.    All other systems reviewed and are negative.        Objective:   Vitals:  Vitals:   10/01/21 1514  BP: 126/74  Pulse: 94  Resp: 16  Temp: 98.1 F (36.7 C)  TempSrc: Oral  SpO2: 97%  Weight: 190 lb 1.6 oz (86.2 kg)  Height: _0  (1.651 m)    Body mass index is 31.63 kg/m.  Physical Exam Vitals and nursing note reviewed. Exam conducted with a chaperone present.  Constitutional:      General: She is not in acute distress.    Appearance: Normal appearance. She is well-developed. She is obese. She is not ill-appearing, toxic-appearing or diaphoretic.     Interventions: Face mask in place.  HENT:     Head: Normocephalic and atraumatic.     Right Ear: External ear normal.     Left Ear: External ear normal. There is no impacted cerumen.     Nose: Nose normal. No congestion.     Mouth/Throat:     Mouth: Mucous membranes are moist.     Pharynx: Oropharynx is clear. No oropharyngeal exudate or posterior oropharyngeal erythema.   Eyes:     General: Lids are normal. No scleral icterus.       Right eye: No discharge.        Left eye: No discharge.     Conjunctiva/sclera: Conjunctivae normal.     Pupils: Pupils are equal, round, and reactive to light.  Neck:     Trachea: Phonation normal. No tracheal deviation.  Cardiovascular:     Rate and Rhythm: Normal rate and regular rhythm.     Pulses: Normal pulses.          Radial pulses are 2+ on the right side and 2+ on the left side.       Posterior tibial pulses are 2+ on the right side and 2+ on the left side.     Heart sounds: Normal heart sounds. No murmur heard.   No friction rub. No gallop.  Pulmonary:     Effort: Pulmonary effort is normal. No respiratory distress.     Breath sounds: Normal breath sounds. No stridor. No wheezing, rhonchi or rales.  Chest:     Chest wall: No tenderness.  Abdominal:     General: Bowel sounds are normal. There is no distension.     Palpations: Abdomen is soft.  Genitourinary:    General: Normal vulva.     Vagina: Vaginal discharge present. No erythema, tenderness, bleeding, lesions or prolapsed vaginal walls.     Cervix: Discharge present. No cervical motion tenderness, friability, lesion, erythema, cervical bleeding or eversion.     Uterus: Normal.      Adnexa: Right adnexa normal and left adnexa normal.     Comments: PAP obtained Musculoskeletal:     Cervical back: Normal range of motion and neck supple.     Right lower leg: No edema.     Left lower leg: No edema.  Skin:    General: Skin is warm  and dry.     Coloration: Skin is not jaundiced or pale.     Findings: No rash.  Neurological:     Mental Status: She is alert. Mental status is at baseline.     Motor: No abnormal muscle tone.     Gait: Gait normal.  Psychiatric:        Mood and Affect: Mood normal.        Speech: Speech normal.        Behavior: Behavior normal.      Fall Risk:    10/01/2021    3:18 PM 08/19/2021    8:53 AM 10/13/2020    3:38 PM  09/29/2020    9:21 AM 08/10/2020    1:24 PM  Fall Risk   Falls in the past year? 0 0 0 0 1  Number falls in past yr:   0 0 0  Injury with Fall?   0 0 0  Risk for fall due to : No Fall Risks No Fall Risks   History of fall(s)  Follow up Falls prevention discussed;Education provided;Falls evaluation completed Falls prevention discussed Falls evaluation completed Falls evaluation completed Falls prevention discussed    Functional Status Survey: Is the patient deaf or have difficulty hearing?: No Does the patient have difficulty seeing, even when wearing glasses/contacts?: No Does the patient have difficulty concentrating, remembering, or making decisions?: No Does the patient have difficulty walking or climbing stairs?: No Does the patient have difficulty dressing or bathing?: No Does the patient have difficulty doing errands alone such as visiting a doctor's office or shopping?: No   Assessment & Plan:    CPE completed today  USPSTF grade A and B recommendations reviewed with patient; age-appropriate recommendations, preventive care, screening tests, etc discussed and encouraged; healthy living encouraged; see AVS for patient education given to patient  Discussed importance of 150 minutes of physical activity weekly, AHA exercise recommendations given to pt in AVS/handout  Discussed importance of healthy diet:  eating lean meats and proteins, avoiding trans fats and saturated fats, avoid simple sugars and excessive carbs in diet, eat 6 servings of fruit/vegetables daily and drink plenty of water and avoid sweet beverages.    Recommended pt to do annual eye exam and routine dental exams/cleanings  Depression, alcohol, fall screening completed as documented above and per flowsheets  Advance Care planning information and packet discussed and offered today, encouraged pt to discuss with family members/spouse/partner/friends and complete Advanced directive packet and bring copy to office    Reviewed Health Maintenance: Health Maintenance  Topic Date Due   MAMMOGRAM  11/19/2013   PAP SMEAR-Modifier  11/19/2021   COVID-19 Vaccine (1) 10/17/2021 (Originally 04/22/1961)   INFLUENZA VACCINE  11/30/2021   TETANUS/TDAP  11/16/2023   COLONOSCOPY (Pts 45-13yr Insurance coverage will need to be confirmed)  10/10/2029   Hepatitis C Screening  Completed   HIV Screening  Completed   Zoster Vaccines- Shingrix  Completed   HPV VACCINES  Aged Out    Immunizations: Immunization History  Administered Date(s) Administered   Pneumococcal Polysaccharide-23 03/16/2007   Td 12/25/2002   Tdap 11/15/2013   Zoster Recombinat (Shingrix) 10/04/2019, 01/04/2020   Vaccines:  HPV: up to at age 246, ask insurance if age between 254-45 Shingrix: 571-64yo and ask insurance if covered when patient above 653yo Pneumonia:  educated and discussed with patient. Flu:  educated and discussed with patient. COVID:      ICD-10-CM   1. Adult general medical  exam  Z00.00     2. Cervical cancer screening  Z12.4 Cytology - PAP   PAP and HPV done today, some vaginal discharge, asx    3. Screening for malignant neoplasm of colon  Z12.11 Ambulatory referral to Gastroenterology   overdue for f/up with Dr. Fortino Sic, PA-C 10/01/21 4:12 PM  [facility]

## 2021-10-05 LAB — CYTOLOGY - PAP
Comment: NEGATIVE
Diagnosis: NEGATIVE
High risk HPV: NEGATIVE

## 2021-10-11 ENCOUNTER — Telehealth: Payer: Self-pay

## 2021-10-11 NOTE — Telephone Encounter (Signed)
CALLED PATIENT NO ANSWER LEFT VOICEMAIL FOR A CALL BACK °Letter sent °

## 2021-11-15 ENCOUNTER — Other Ambulatory Visit: Payer: Self-pay | Admitting: Unknown Physician Specialty

## 2021-11-16 NOTE — Telephone Encounter (Signed)
Requested medication (s) are due for refill today: yes  Requested medication (s) are on the active medication list: yes  Last refill:  09/29/20 #180 1 RF  Future visit scheduled: yes  Notes to clinic:  last ordered last year- please review if appropriate to refill   Requested Prescriptions  Pending Prescriptions Disp Refills   BRILINTA 60 MG TABS tablet [Pharmacy Med Name: BRILINTA 60 MG TABLET] 60 tablet 0    Sig: Take 1 tablet (60 mg total) by mouth 2 (two) times daily.     Hematology: Antiplatelets - ticagrelor Failed - 11/15/2021  2:20 PM      Failed - HCT in normal range and within 180 days    HCT  Date Value Ref Range Status  08/19/2021 45.9 (H) 35.0 - 45.0 % Final         Passed - Cr in normal range and within 180 days    Creat  Date Value Ref Range Status  08/19/2021 0.97 0.50 - 1.03 mg/dL Final   Creatinine, Urine  Date Value Ref Range Status  06/08/2018 95 20 - 275 mg/dL Final         Passed - HGB in normal range and within 180 days    Hemoglobin  Date Value Ref Range Status  08/19/2021 15.5 11.7 - 15.5 g/dL Final         Passed - PLT in normal range and within 180 days    Platelets  Date Value Ref Range Status  08/19/2021 195 140 - 400 Thousand/uL Final         Passed - Last Heart Rate in normal range    Pulse Readings from Last 1 Encounters:  10/01/21 94         Passed - Valid encounter within last 6 months    Recent Outpatient Visits           1 month ago Adult general medical exam   [facility] Delsa Grana, PA-C   2 months ago Benign essential hypertension   [facility] Delsa Grana, PA-C   1 year ago    [place], NP   1 year ago Benign essential hypertension   [place], NP   1 year ago Skin infection   [facility] Steele Sizer, MD       Future Appointments             In 3 days [place],  MD [facility], [place]   In 4 months Delsa Grana, PA-C [facility], [facility]

## 2021-11-17 ENCOUNTER — Encounter: Payer: Self-pay | Admitting: *Deleted

## 2021-11-18 ENCOUNTER — Other Ambulatory Visit: Payer: Self-pay | Admitting: Family Medicine

## 2021-11-18 NOTE — Telephone Encounter (Unsigned)
Copied from [place] [id number]. Topic: General - Other >> Nov 18, 2021  3:44 PM Everette C wrote: Reason for CRM: Medication Refill - Medication: ticagrelor (BRILINTA) 60 MG TABS tablet [[id number]]   Has the patient contacted their pharmacy? Yes.  The patient has been directed to contact their PCP  (Agent: If no, request that the patient contact the pharmacy for the refill. If patient does not wish to contact the pharmacy document the reason why and proceed with request.) (Agent: If yes, when and what did the pharmacy advise?)  Preferred Pharmacy (with phone number or street name): [place], [place] [address] Phone: [phone] Fax: [phone] Hours: Not open 24 hours  Has the patient been seen for an appointment in the last year OR does the patient have an upcoming appointment? Yes.    Agent: Please be advised that RX refills may take up to 3 business days. We ask that you follow-up with your pharmacy.

## 2021-11-19 ENCOUNTER — Ambulatory Visit (INDEPENDENT_AMBULATORY_CARE_PROVIDER_SITE_OTHER): Payer: 59 | Admitting: Cardiovascular Disease

## 2021-11-19 ENCOUNTER — Encounter: Payer: Self-pay | Admitting: Cardiovascular Disease

## 2021-11-19 VITALS — BP 116/90 | HR 75 | Ht 65.5 in | Wt 194.0 lb

## 2021-11-19 DIAGNOSIS — E782 Mixed hyperlipidemia: Secondary | ICD-10-CM

## 2021-11-19 DIAGNOSIS — I10 Essential (primary) hypertension: Secondary | ICD-10-CM

## 2021-11-19 DIAGNOSIS — Z720 Tobacco use: Secondary | ICD-10-CM | POA: Diagnosis not present

## 2021-11-19 DIAGNOSIS — I2510 Atherosclerotic heart disease of native coronary artery without angina pectoris: Secondary | ICD-10-CM

## 2021-11-19 MED ORDER — CLOPIDOGREL BISULFATE 75 MG PO TABS: 75.0000 mg | ORAL_TABLET | Freq: Every day | ORAL | 3 refills | Status: DC

## 2021-11-19 NOTE — Progress Notes (Signed)
Cardiology Office Note   Date:  11/19/2021   ID:  Maria Franco, DOB 05-10-1960, MRN [id number]  PCP:  Danelle Berry, PA-C  Cardiologist:   Lorine Bears, MD   Chief Complaint  Patient presents with   Follow-up      History of Present Illness: Maria Franco is a 61 y.o. female who presents as a new patient to establish cardiovascular care.  Previous cardiologist was Dr. Welton Flakes. She has known history of coronary artery disease with previous cardiac catheterization in 2006 that showed occluded mid LAD with right to left collaterals.  This was treated successfully with PCI and drug-eluting stent placement with a 3.0 x 23 mm Cypher stent. Other medical problems include tobacco use hyperlipidemia, essential hypertension and sleep apnea.  She had multiple stress test and echocardiogram throughout the years.  She works at the city of [employer].  She was adopted but recently met her biologic mother. She has been doing well with no recent chest pain, shortness of breath or palpitations.  She takes her medications regularly.  She has been on dual antiplatelet therapy since her stent placement.  She is not diabetic.    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   evaluated be urology   Sleep apnea    no CPAP   Tricuspid valve regurgitation     Past Surgical History:  Procedure Laterality Date   COLONOSCOPY WITH PROPOFOL N/A 10/11/2019   Procedure: COLONOSCOPY WITH PROPOFOL;  Surgeon: Midge Minium, MD;  Location: [facility];  Service: Endoscopy;  Laterality: N/A;  priority 4   CORONARY ANGIOPLASTY WITH STENT  PLACEMENT  2006   x1   ENDOMETRIAL ABLATION  2012   HERNIA REPAIR     at 7 yrs of age   POLYPECTOMY  10/11/2019   Procedure: POLYPECTOMY;  Surgeon: Midge Minium, MD;  Location: [facility];  Service: Endoscopy;;   TUBAL LIGATION  2012     Current Outpatient Medications  Medication Sig Dispense Refill   amLODipine (NORVASC) 5 MG tablet Take 1 tablet (5 mg total) by mouth daily. 90 tablet 3   aspirin 81 MG tablet Take 81 mg by mouth daily.     BRILINTA 60 MG TABS tablet Take 1 tablet (60 mg total) by mouth 2 (two) times daily. 60 tablet 0   chlorthalidone (HYGROTON) 25 MG tablet Take 1 tablet (25 mg total) by mouth daily. 90 tablet 3   Cholecalciferol (VITAMIN D3) 50 MCG (2000 UT) TABS Take by mouth.     metoprolol succinate (TOPROL-XL) 50 MG 24 hr tablet Take 1 tablet (50 mg total) by mouth daily. Take with or immediately following a meal. 90 tablet 3   Multiple Vitamins-Minerals (MULTIVITAMIN WOMEN PO) Take by mouth. Vitafusion for women gummies     nitroGLYCERIN (NITROSTAT) 0.4 MG SL tablet Place under the tongue.     rosuvastatin (CRESTOR) 40 MG tablet Take 1 tablet (40 mg total) by mouth every morning. 90 tablet 3   valsartan (DIOVAN)  160 MG tablet Take 1 tablet (160 mg total) by mouth daily. 90 tablet 3   venlafaxine XR (EFFEXOR-XR) 75 MG 24 hr capsule Take 1 capsule (75 mg total) by mouth daily with breakfast. 90 capsule 3   zinc gluconate 50 MG tablet Take 50 mg by mouth daily.     No current facility-administered medications for this visit.    Allergies:   Dilaudid [hydromorphone hcl] and Hydromorphone    Social History:  The patient  reports that she has been smoking cigarettes. She has a 30.00 pack-year smoking history. She has never used smokeless tobacco. She reports current alcohol use of about 6.0 standard drinks of alcohol per week. She reports that she does not use drugs.   Family History:  The patient's She was adopted. Family history is unknown by patient.     ROS:  Please see the history of present illness.   Otherwise, review of systems are positive for none.   All other systems are reviewed and negative.    PHYSICAL EXAM: VS:  BP 116/90 (BP Location: Left Arm, Patient Position: Sitting, Cuff Size: Large)   Pulse 75   Ht 5' 5.5" (1.664 m)   Wt 194 lb (88 kg)   BMI 31.79 kg/m  , BMI Body mass index is 31.79 kg/m. GEN: Well nourished, well developed, in no acute distress  HEENT: normal  Neck: no JVD, carotid bruits, or masses Cardiac: RRR; no murmurs, rubs, or gallops,no edema  Respiratory:  clear to auscultation bilaterally, normal work of breathing GI: soft, nontender, nondistended, + BS MS: no deformity or atrophy  Skin: warm and dry, no rash Neuro:  Strength and sensation are intact Psych: euthymic mood, full affect   EKG:  EKG is ordered today. The ekg ordered today demonstrates normal sinus rhythm with no significant ST or T wave changes.   Recent Labs: 08/19/2021: ALT 20; BUN 17; Creat 0.97; Hemoglobin 15.5; Platelets 195; Potassium 3.5; Sodium 141    Lipid Panel    Component Value Date/Time   CHOL 121 08/19/2021 0930   CHOL 144 04/23/2015 1018   TRIG 222 (H) 08/19/2021 0930   HDL 49 (L) 08/19/2021 0930   HDL 41 04/23/2015 1018   CHOLHDL 2.5 08/19/2021 0930   VLDL 23 06/27/2016 0827   LDLCALC 44 08/19/2021 0930      Wt Readings from Last 3 Encounters:  11/19/21 194 lb (88 kg)  10/01/21 190 lb 1.6 oz (86.2 kg)  08/19/21 187 lb 1.6 oz (84.9 kg)          No data to display            ASSESSMENT AND PLAN:  1.  Coronary artery disease involving native coronary arteries without angina: She is doing well overall with no anginal symptoms.  I discussed with her the indication of long-term dual antiplatelet therapy.  Even though her PCI was in 2006, she had a first generation drug-eluting stent and thus I favor continued dual antiplatelet therapy.  I elected to switch ticagrelor to clopidogrel 75 mg once  daily.  2.  Hyperlipidemia: Continue rosuvastatin 40 mg once daily.  I reviewed most recent lipid profile which showed an LDL of 44.  Triglyceride was mildly elevated at 222.  3.  Essential hypertension: Blood pressure is well controlled on current medications.  4.  Tobacco use: I discussed with her the importance of smoking cessation.    Disposition:   FU with me in 6 months  Signed,  Lorine Bears,  MD  11/19/2021 3:33 PM    [facility]

## 2021-11-19 NOTE — Patient Instructions (Signed)
Medication Instructions:   Your physician has recommended you make the following change in your medication:  STOP Brilinta -Start Plavix 75 mg tablet once daily.  *If you need a refill on your cardiac medications before your next appointment, please call your pharmacy*   Lab Work: NONE If you have labs (blood work) drawn today and your tests are completely normal, you will receive your results only by: [place] (if you have MyChart) OR A paper copy in the mail If you have any lab test that is abnormal or we need to change your treatment, we will call you to review the results.   Testing/Procedures: NONE   Follow-Up: At [facility], you and your health needs are our priority.  As part of our continuing mission to provide you with exceptional heart care, we have created designated Provider Care Teams.  These Care Teams include your primary Cardiologist (physician) and Advanced Practice Providers (APPs -  Physician Assistants and Nurse Practitioners) who all work together to provide you with the care you need, when you need it.  We recommend signing up for the patient portal called "MyChart".  Sign up information is provided on this After Visit Summary.  MyChart is used to connect with patients for Virtual Visits (Telemedicine).  Patients are able to view lab/test results, encounter notes, upcoming appointments, etc.  Non-urgent messages can be sent to your provider as well.   To learn more about what you can do with MyChart, go to [email or web address].    Your next appointment:   6 month(s)  The format for your next appointment:   In Person  Provider:   You may see Kathlyn Sacramento, MD or one of the following Advanced Practice Providers on your designated Care Team:   Murray Hodgkins, NP Christell Faith, PA-C Cadence Kathlen Mody, PA-C{     Important Information About Sugar

## 2021-11-19 NOTE — Telephone Encounter (Signed)
reordered 11/19/21 by Dr. Ancil Boozer  Requested Prescriptions  Refused Prescriptions Disp Refills  . ticagrelor (BRILINTA) 60 MG TABS tablet 180 tablet 1    Sig: Take 1 tablet (60 mg total) by mouth 2 (two) times daily.     Hematology: Antiplatelets - ticagrelor Failed - 11/18/2021  5:00 PM      Failed - HCT in normal range and within 180 days    HCT  Date Value Ref Range Status  08/19/2021 45.9 (H) 35.0 - 45.0 % Final         Passed - Cr in normal range and within 180 days    Creat  Date Value Ref Range Status  08/19/2021 0.97 0.50 - 1.03 mg/dL Final   Creatinine, Urine  Date Value Ref Range Status  06/08/2018 95 20 - 275 mg/dL Final         Passed - HGB in normal range and within 180 days    Hemoglobin  Date Value Ref Range Status  08/19/2021 15.5 11.7 - 15.5 g/dL Final         Passed - PLT in normal range and within 180 days    Platelets  Date Value Ref Range Status  08/19/2021 195 140 - 400 Thousand/uL Final         Passed - Last Heart Rate in normal range    Pulse Readings from Last 1 Encounters:  10/01/21 94         Passed - Valid encounter within last 6 months    Recent Outpatient Visits          1 month ago Adult general medical exam   [facility] Delsa Grana, PA-C   3 months ago Benign essential hypertension   [facility] Delsa Grana, PA-C   1 year ago    [place], NP   1 year ago Benign essential hypertension   [place], NP   1 year ago Skin infection   [facility] Steele Sizer, MD      Future Appointments            Today Wellington Hampshire, MD [facility] [place]   In 4 months Delsa Grana, PA-C [facility], [facility]

## 2022-04-08 ENCOUNTER — Ambulatory Visit (INDEPENDENT_AMBULATORY_CARE_PROVIDER_SITE_OTHER): Payer: 59 | Admitting: Family Medicine

## 2022-04-08 ENCOUNTER — Encounter: Payer: Self-pay | Admitting: Family Medicine

## 2022-04-08 VITALS — BP 130/76 | HR 83 | Temp 98.4°F | Resp 16 | Ht 65.5 in | Wt 191.0 lb

## 2022-04-08 DIAGNOSIS — R69 Illness, unspecified: Secondary | ICD-10-CM | POA: Diagnosis not present

## 2022-04-08 DIAGNOSIS — I2510 Atherosclerotic heart disease of native coronary artery without angina pectoris: Secondary | ICD-10-CM

## 2022-04-08 DIAGNOSIS — E782 Mixed hyperlipidemia: Secondary | ICD-10-CM | POA: Diagnosis not present

## 2022-04-08 DIAGNOSIS — I10 Essential (primary) hypertension: Secondary | ICD-10-CM | POA: Diagnosis not present

## 2022-04-08 DIAGNOSIS — F419 Anxiety disorder, unspecified: Secondary | ICD-10-CM

## 2022-04-08 DIAGNOSIS — Z5181 Encounter for therapeutic drug level monitoring: Secondary | ICD-10-CM

## 2022-04-08 DIAGNOSIS — Z23 Encounter for immunization: Secondary | ICD-10-CM | POA: Diagnosis not present

## 2022-04-08 NOTE — Progress Notes (Unsigned)
Name: Maria Franco   MRN: [id number]    DOB: [birth year]   Date:04/08/2022       Progress Note  Chief Complaint  Patient presents with   Follow-up   Hypertension   Hyperlipidemia   Anxiety     Subjective:   Maria Franco is a 61 y.o. female, presents to clinic for f/up on chronic conditions  Hypertension:  Currently managed on chlorthalidone 25 mg, Norvasc 5 mg, metoprolol 50 mg and valsartan 160 Pt reports good med compliance and denies any SE.   Blood pressure today is well controlled. BP Readings from Last 3 Encounters:  04/08/22 130/76  11/19/21 116/90  10/01/21 126/74   Pt denies CP, SOB, exertional sx, LE edema, palpitation, Ha's, visual disturbances, lightheadedness, hypotension, syncope. No sx   Hyperlipidemia: Currently treated with Crestor 40 mg daily, pt reports good med compliance Last Lipids: Lab Results  Component Value Date   CHOL 121 08/19/2021   HDL 49 (L) 08/19/2021   LDLCALC 44 08/19/2021   TRIG 222 (H) 08/19/2021   CHOLHDL 2.5 08/19/2021   - Denies: Chest pain, shortness of breath, myalgias, claudication  Anxiety managed with Effexor 75 mg daily PHQ-9 and GAD-7 have been reviewed and are negative    04/08/2022    2:44 PM 10/01/2021    3:17 PM 08/19/2021    8:53 AM  Depression screen PHQ 2/9  Decreased Interest 0 0 0  Down, Depressed, Hopeless 0 0 0  PHQ - 2 Score 0 0 0  Altered sleeping 0 0   Tired, decreased energy 0 0   Change in appetite 0 0   Feeling bad or failure about yourself  0 0   Trouble concentrating 0 0   Moving slowly or fidgety/restless 0 0   Suicidal thoughts 0 0   PHQ-9 Score 0 0   Difficult doing work/chores Not difficult at all        04/08/2022    2:57 PM 10/01/2021    3:19 PM 03/31/2020    3:56 PM  GAD 7 : Generalized Anxiety Score  Nervous, Anxious, on Edge 0 0 0  Control/stop worrying 0 0 0  Worry too much - different things 0 0 0  Trouble relaxing 0 0 0  Restless 0 0 0  Easily annoyed or irritable 0 0 0   Afraid - awful might happen 0 0 0  Total GAD 7 Score 0 0 0  Anxiety Difficulty Not difficult at all  Not difficult at all   Current smoker -she declined smoking cessation resources or discussion today    Current Outpatient Medications:    amLODipine (NORVASC) 5 MG tablet, Take 1 tablet (5 mg total) by mouth daily., Disp: 90 tablet, Rfl: 3   aspirin 81 MG tablet, Take 81 mg by mouth daily., Disp: , Rfl:    chlorthalidone (HYGROTON) 25 MG tablet, Take 1 tablet (25 mg total) by mouth daily., Disp: 90 tablet, Rfl: 3   Cholecalciferol (VITAMIN D3) 50 MCG (2000 UT) TABS, Take by mouth., Disp: , Rfl:    clopidogrel (PLAVIX) 75 MG tablet, Take 1 tablet (75 mg total) by mouth daily., Disp: 90 tablet, Rfl: 3   metoprolol succinate (TOPROL-XL) 50 MG 24 hr tablet, Take 1 tablet (50 mg total) by mouth daily. Take with or immediately following a meal., Disp: 90 tablet, Rfl: 3   Multiple Vitamins-Minerals (MULTIVITAMIN WOMEN PO), Take by mouth. Vitafusion for women gummies, Disp: , Rfl:    nitroGLYCERIN (NITROSTAT) 0.4  MG SL tablet, Place under the tongue., Disp: , Rfl:    rosuvastatin (CRESTOR) 40 MG tablet, Take 1 tablet (40 mg total) by mouth every morning., Disp: 90 tablet, Rfl: 3   valsartan (DIOVAN) 160 MG tablet, Take 1 tablet (160 mg total) by mouth daily., Disp: 90 tablet, Rfl: 3   venlafaxine XR (EFFEXOR-XR) 75 MG 24 hr capsule, Take 1 capsule (75 mg total) by mouth daily with breakfast., Disp: 90 capsule, Rfl: 3   zinc gluconate 50 MG tablet, Take 50 mg by mouth daily., Disp: , Rfl:   Patient Active Problem List   Diagnosis Date Noted   Polyp of descending colon    Polyp of ascending colon    Benign essential hypertension 07/10/2019   Proteinuria 07/10/2019   Abnormal Papanicolaou smear of cervix with positive human papilloma virus (HPV) test 03/27/2018   Encounter for screening colonoscopy 03/26/2018   Obesity (BMI 30.0-34.9) 07/30/2017   Anxiety 10/31/2015   Status post coronary  artery stent placement 11/21/2014   Hyperlipidemia    Panic attacks    CAD (coronary artery disease)    Tobacco use    Tricuspid valve regurgitation    MVP (mitral valve prolapse)    Left adrenal mass ([place]) 07/16/2013    Past Surgical History:  Procedure Laterality Date   COLONOSCOPY WITH PROPOFOL N/A 10/11/2019   Procedure: COLONOSCOPY WITH PROPOFOL;  Surgeon: Lucilla Lame, MD;  Location: [place];  Service: Endoscopy;  Laterality: N/A;  priority 4   CORONARY ANGIOPLASTY WITH STENT PLACEMENT  2006   x1   ENDOMETRIAL ABLATION  2012   HERNIA REPAIR     at 7 yrs of age   POLYPECTOMY  10/11/2019   Procedure: POLYPECTOMY;  Surgeon: Lucilla Lame, MD;  Location: [place];  Service: Endoscopy;;   TUBAL LIGATION  2012    Family History  Adopted: Yes  Family history unknown: Yes    Social History   Tobacco Use   Smoking status: Every Day    Packs/day: 1.00    Years: 30.00    Total pack years: 30.00    Types: Cigarettes   Smokeless tobacco: Never   Tobacco comments:    39 years  Vaping Use   Vaping Use: Former  Substance Use Topics   Alcohol use: Yes    Alcohol/week: 6.0 standard drinks of alcohol    Types: 6 Glasses of wine per week    Comment: Occasionally   Drug use: No     Allergies  Allergen Reactions   Dilaudid [Hydromorphone Hcl] Nausea And Vomiting   Hydromorphone Nausea Only and Nausea And Vomiting    Health Maintenance  Topic Date Due   MAMMOGRAM  11/19/2013   INFLUENZA VACCINE  07/31/2022 (Originally 11/30/2021)   Lung Cancer Screening  04/09/2023 (Originally 10/20/2012)   DTaP/Tdap/Td (3 - Td or Tdap) 11/16/2023   PAP SMEAR-Modifier  10/01/2024   COLONOSCOPY (Pts 45-58yr Insurance coverage will need to be confirmed)  10/10/2029   Hepatitis C Screening  Completed   HIV Screening  Completed   Zoster Vaccines- Shingrix  Completed   HPV VACCINES  Aged Out   COVID-19 Vaccine  Discontinued    Chart Review Today: I personally  reviewed active problem list, medication list, allergies, family history, social history, health maintenance, notes from last encounter, lab results, imaging with the patient/caregiver today.   Review of Systems  Constitutional: Negative.   HENT: Negative.    Eyes: Negative.   Respiratory: Negative.  Cardiovascular: Negative.   Gastrointestinal: Negative.   Endocrine: Negative.   Genitourinary: Negative.   Musculoskeletal: Negative.   Skin: Negative.   Allergic/Immunologic: Negative.   Neurological: Negative.   Hematological: Negative.   Psychiatric/Behavioral: Negative.    All other systems reviewed and are negative.    Objective:   Vitals:   04/08/22 1457  BP: 130/76  Pulse: 83  Resp: 16  Temp: 98.4 F (36.9 C)  TempSrc: Oral  SpO2: 98%  Weight: 191 lb (86.6 kg)  Height: 5' 5.5" (1.664 m)    Body mass index is 31.3 kg/m.  Physical Exam      Assessment & Plan:   Problem List Items Addressed This Visit       Cardiovascular and Mediastinum   CAD (coronary artery disease) (Chronic)   Benign essential hypertension - Primary   Relevant Orders   COMPLETE METABOLIC PANEL WITH GFR     Other   Hyperlipidemia   Relevant Orders   COMPLETE METABOLIC PANEL WITH GFR   Anxiety   Other Visit Diagnoses     Encounter for medication monitoring       Relevant Orders   COMPLETE METABOLIC PANEL WITH GFR   Need for influenza vaccination            No follow-ups on file.   Delsa Grana, PA-C 04/08/22 3:12 PM

## 2022-04-08 NOTE — Patient Instructions (Signed)
Health Maintenance  Topic Date Due   Mammogram  11/19/2013   Flu Shot  07/31/2022*   Screening for Lung Cancer  04/09/2023*   DTaP/Tdap/Td vaccine (3 - Td or Tdap) 11/16/2023   Pap Smear  10/01/2024   Colon Cancer Screening  10/10/2029   Hepatitis C Screening: USPSTF Recommendation to screen - Ages 18-59 yo.  Completed   HIV Screening  Completed   Zoster (Shingles) Vaccine  Completed   HPV Vaccine  Aged Out   COVID-19 Vaccine  Discontinued  *Topic was postponed. The date shown is not the original due date.   Please call and complete your mammogram [facility] [address], [place], [place] [address] Scheduling phone #: [phone]  You are eligible for lung cancer screening - please consider discussing this with the pulmonary team.  DASH Eating Plan DASH stands for Dietary Approaches to Stop Hypertension. The DASH eating plan is a healthy eating plan that has been shown to: Reduce high blood pressure (hypertension). Reduce your risk for type 2 diabetes, heart disease, and stroke. Help with weight loss. What are tips for following this plan? Reading food labels Check food labels for the amount of salt (sodium) per serving. Choose foods with less than 5 percent of the Daily Value of sodium. Generally, foods with less than 300 milligrams (mg) of sodium per serving fit into this eating plan. To find whole grains, look for the word "whole" as the first word in the ingredient list. Shopping Buy products labeled as "low-sodium" or "no salt added." Buy fresh foods. Avoid canned foods and pre-made or frozen meals. Cooking Avoid adding salt when cooking. Use salt-free seasonings or herbs instead of table salt or sea salt. Check with your health care provider or pharmacist before using salt substitutes. Do not fry foods. Cook foods using healthy methods such as baking, boiling, grilling, roasting, and broiling instead. Cook with heart-healthy  oils, such as olive, canola, avocado, soybean, or sunflower oil. Meal planning  Eat a balanced diet that includes: 4 or more servings of fruits and 4 or more servings of vegetables each day. Try to fill one-half of your plate with fruits and vegetables. 6-8 servings of whole grains each day. Less than 6 oz (170 g) of lean meat, poultry, or fish each day. A 3-oz (85-g) serving of meat is about the same size as a deck of cards. One egg equals 1 oz (28 g). 2-3 servings of low-fat dairy each day. One serving is 1 cup (237 mL). 1 serving of nuts, seeds, or beans 5 times each week. 2-3 servings of heart-healthy fats. Healthy fats called omega-3 fatty acids are found in foods such as walnuts, flaxseeds, fortified milks, and eggs. These fats are also found in cold-water fish, such as sardines, salmon, and mackerel. Limit how much you eat of: Canned or prepackaged foods. Food that is high in trans fat, such as some fried foods. Food that is high in saturated fat, such as fatty meat. Desserts and other sweets, sugary drinks, and other foods with added sugar. Full-fat dairy products. Do not salt foods before eating. Do not eat more than 4 egg yolks a week. Try to eat at least 2 vegetarian meals a week. Eat more home-cooked food and less restaurant, buffet, and fast food. Lifestyle When eating at a restaurant, ask that your food be prepared with less salt or no salt, if possible. If you drink alcohol: Limit how much you use to: 0-1  drink a day for women who are not pregnant. 0-2 drinks a day for men. Be aware of how much alcohol is in your drink. In the [place] one drink equals one 12 oz bottle of beer (355 mL), one 5 oz glass of wine (148 mL), or one 1 oz glass of hard liquor (44 mL). General information Avoid eating more than 2,300 mg of salt a day. If you have hypertension, you may need to reduce your sodium intake to 1,500 mg a day. Work with your health care provider to maintain a healthy body  weight or to lose weight. Ask what an ideal weight is for you. Get at least 30 minutes of exercise that causes your heart to beat faster (aerobic exercise) most days of the week. Activities may include walking, swimming, or biking. Work with your health care provider or dietitian to adjust your eating plan to your individual calorie needs. What foods should I eat? Fruits All fresh, dried, or frozen fruit. Canned fruit in natural juice (without added sugar). Vegetables Fresh or frozen vegetables (raw, steamed, roasted, or grilled). Low-sodium or reduced-sodium tomato and vegetable juice. Low-sodium or reduced-sodium tomato sauce and tomato paste. Low-sodium or reduced-sodium canned vegetables. Grains Whole-grain or whole-wheat bread. Whole-grain or whole-wheat pasta. Brown rice. [place]. Bulgur. Whole-grain and low-sodium cereals. Pita bread. Low-fat, low-sodium crackers. Whole-wheat flour tortillas. Meats and other proteins Skinless chicken or [place]. Ground chicken or [place]. Pork with fat trimmed off. Fish and seafood. Egg whites. Dried beans, peas, or lentils. Unsalted nuts, nut butters, and seeds. Unsalted canned beans. Lean cuts of beef with fat trimmed off. Low-sodium, lean precooked or cured meat, such as sausages or meat loaves. Dairy Low-fat (1%) or fat-free (skim) milk. Reduced-fat, low-fat, or fat-free cheeses. Nonfat, low-sodium ricotta or cottage cheese. Low-fat or nonfat yogurt. Low-fat, low-sodium cheese. Fats and oils Soft margarine without trans fats. Vegetable oil. Reduced-fat, low-fat, or light mayonnaise and salad dressings (reduced-sodium). Canola, safflower, olive, avocado, soybean, and sunflower oils. Avocado. Seasonings and condiments Herbs. Spices. Seasoning mixes without salt. Other foods Unsalted popcorn and pretzels. Fat-free sweets. The items listed above may not be a complete list of foods and beverages you can eat. Contact a dietitian for more  information. What foods should I avoid? Fruits Canned fruit in a light or heavy syrup. Fried fruit. Fruit in cream or butter sauce. Vegetables Creamed or fried vegetables. Vegetables in a cheese sauce. Regular canned vegetables (not low-sodium or reduced-sodium). Regular canned tomato sauce and paste (not low-sodium or reduced-sodium). Regular tomato and vegetable juice (not low-sodium or reduced-sodium). Angie Fava. Olives. Grains Baked goods made with fat, such as croissants, muffins, or some breads. Dry pasta or rice meal packs. Meats and other proteins Fatty cuts of meat. Ribs. Fried meat. Berniece Salines. Bologna, salami, and other precooked or cured meats, such as sausages or meat loaves. Fat from the back of a pig (fatback). Bratwurst. Salted nuts and seeds. Canned beans with added salt. Canned or smoked fish. Whole eggs or egg yolks. Chicken or [place] with skin. Dairy Whole or 2% milk, cream, and half-and-half. Whole or full-fat cream cheese. Whole-fat or sweetened yogurt. Full-fat cheese. Nondairy creamers. Whipped toppings. Processed cheese and cheese spreads. Fats and oils Butter. Stick margarine. Lard. Shortening. Ghee. Bacon fat. Tropical oils, such as coconut, palm kernel, or palm oil. Seasonings and condiments Onion salt, garlic salt, seasoned salt, table salt, and sea salt. Worcestershire sauce. Tartar sauce. Barbecue sauce. Teriyaki sauce. Soy sauce, including reduced-sodium. Steak sauce. Canned and packaged  gravies. Fish sauce. Oyster sauce. Cocktail sauce. Store-bought horseradish. Ketchup. Mustard. Meat flavorings and tenderizers. Bouillon cubes. Hot sauces. Pre-made or packaged marinades. Pre-made or packaged taco seasonings. Relishes. Regular salad dressings. Other foods Salted popcorn and pretzels. The items listed above may not be a complete list of foods and beverages you should avoid. Contact a dietitian for more information. Where to find more information National Heart, Lung, and  Blood Institute: https://wilson-eaton.com/ American Heart Association: www.heart.org Academy of Nutrition and Dietetics: www.eatright.White: www.kidney.org Summary The DASH eating plan is a healthy eating plan that has been shown to reduce high blood pressure (hypertension). It may also reduce your risk for type 2 diabetes, heart disease, and stroke. When on the DASH eating plan, aim to eat more fresh fruits and vegetables, whole grains, lean proteins, low-fat dairy, and heart-healthy fats. With the DASH eating plan, you should limit salt (sodium) intake to 2,300 mg a day. If you have hypertension, you may need to reduce your sodium intake to 1,500 mg a day. Work with your health care provider or dietitian to adjust your eating plan to your individual calorie needs. This information is not intended to replace advice given to you by your health care provider. Make sure you discuss any questions you have with your health care provider. Document Revised: 03/22/2019 Document Reviewed: 03/22/2019 Elsevier Patient Education  [place].

## 2022-04-11 NOTE — Assessment & Plan Note (Signed)
Symptoms stable and well-controlled on venlafaxine 75 mg PHQ-9 and GAD-7 reviewed and negative

## 2022-04-11 NOTE — Assessment & Plan Note (Signed)
Good statin compliance she is taking Crestor 40 mg once daily encouraged to continue to work on diet and lifestyle efforts

## 2022-04-11 NOTE — Assessment & Plan Note (Signed)
She has followed with cardiology with, last visit and testing reviewed her blood pressure and cholesterol were controlled

## 2022-04-11 NOTE — Assessment & Plan Note (Addendum)
Blood pressure well-controlled and at goal today continue amlodipine 5 mg, chlorthalidone 25 mg, metoprolol XL 50 mg daily, valsartan 160 and additional diet lifestyle efforts BP Readings from Last 3 Encounters:  04/08/22 130/76  11/19/21 116/90  10/01/21 126/74

## 2022-04-15 ENCOUNTER — Other Ambulatory Visit: Payer: Self-pay | Admitting: Unknown Physician Specialty

## 2022-04-15 DIAGNOSIS — I2510 Atherosclerotic heart disease of native coronary artery without angina pectoris: Secondary | ICD-10-CM

## 2022-04-15 NOTE — Telephone Encounter (Signed)
Refilled 09/07/2021 #90 3 rf - same pharmacy - confirmed. Requested Prescriptions  Pending Prescriptions Disp Refills   metoprolol succinate (TOPROL-XL) 50 MG 24 hr tablet [Pharmacy Med Name: METOPROLOL SUCC ER 50 MG TAB] 90 tablet 0    Sig: Take 1 tablet (50 mg total) by mouth daily. Take with or immediately following a meal.     Cardiovascular:  Beta Blockers Passed - 04/15/2022  4:18 PM      Passed - Last BP in normal range    BP Readings from Last 1 Encounters:  04/08/22 130/76         Passed - Last Heart Rate in normal range    Pulse Readings from Last 1 Encounters:  04/08/22 83         Passed - Valid encounter within last 6 months    Recent Outpatient Visits           1 week ago Benign essential hypertension   [facility] Delsa Grana, PA-C   6 months ago Adult general medical exam   [facility] Delsa Grana, PA-C   7 months ago Benign essential hypertension   [facility] Delsa Grana, PA-C   1 year ago    [place], NP   1 year ago Benign essential hypertension   [place], NP       Future Appointments             In 5 months Delsa Grana, PA-C [facility], [facility]

## 2022-05-30 DIAGNOSIS — H5213 Myopia, bilateral: Secondary | ICD-10-CM | POA: Diagnosis not present

## 2022-09-01 ENCOUNTER — Other Ambulatory Visit: Payer: Self-pay | Admitting: Unknown Physician Specialty

## 2022-09-01 ENCOUNTER — Ambulatory Visit: Payer: Self-pay | Admitting: Physician Assistant

## 2022-09-01 ENCOUNTER — Other Ambulatory Visit: Payer: Self-pay | Admitting: Family Medicine

## 2022-09-01 ENCOUNTER — Encounter: Payer: Self-pay | Admitting: Physician Assistant

## 2022-09-01 VITALS — BP 130/77 | HR 66 | Temp 97.1°F | Resp 12 | Ht 65.0 in | Wt 190.0 lb

## 2022-09-01 DIAGNOSIS — M778 Other enthesopathies, not elsewhere classified: Secondary | ICD-10-CM

## 2022-09-01 DIAGNOSIS — I2510 Atherosclerotic heart disease of native coronary artery without angina pectoris: Secondary | ICD-10-CM

## 2022-09-01 MED ORDER — ORPHENADRINE CITRATE ER 100 MG PO TB12: 100.0000 mg | ORAL_TABLET | Freq: Two times a day (BID) | ORAL | 0 refills | Status: DC

## 2022-09-01 MED ORDER — METHYLPREDNISOLONE 4 MG PO TBPK: ORAL_TABLET | ORAL | 0 refills | Status: DC

## 2022-09-01 NOTE — Progress Notes (Signed)
Right shoulder pain x1 month Deep ache in right shoulder Has taken tylenol w/o relief & using Biofreeze State getting worse Has gotten a new mattress & changed chair at work w/o any relief.  AMD

## 2022-09-01 NOTE — Progress Notes (Signed)
   Subjective: Right shoulder pain    Patient ID: Maria Franco, female    DOB: 25-Jul-1960, 62 y.o.   MRN: [id number]  HPI Patient complains of right posterior upper shoulder pain.  Patient states pain for greater than 1 month.  Patient states he noticed the pain dissipated after she went to [place] for 3 weeks.  Patient states upon returning to her home the pain reoccurred.  Patient believes it may be secondary to her mattress.  Denies any other provocative incident.  Describes the pain as "achy".  Rates pain as 5/10.  Palliative measures consist only of IcyHot and Tylenol.  Review of Systems Anxiety, CAD, hyperlipidemia, and hypertension.    Objective:   Physical Exam BP 130/77  BP Location Left Arm  Patient Position Sitting  Cuff Size Normal  Pulse 66  Resp 12  Temp 97.1 F (36.2 C)  Temp src Temporal  SpO2 99 %  Weight 190 lb (86.2 kg)  Height 5\' 5"  (1.651 m)   BMI 31.62 kg/m2  BSA 1.99 m2  Patient is right-hand dominant.  No obvious deformity of the right upper extremity.  Patient has full and equal range of motion of the right upper extremity.  Moderate guarding palpation of the  teres minor area of the shoulder.      Assessment & Plan: Tendinitis right shoulder  Trial of heat, Medrol Dosepak, and Norflex for 5 days.  If no improvement advised follow-up with orthopedics.

## 2022-09-02 ENCOUNTER — Telehealth: Payer: Self-pay

## 2022-09-02 ENCOUNTER — Other Ambulatory Visit: Payer: Self-pay | Admitting: Physician Assistant

## 2022-09-02 MED ORDER — OXYCODONE-ACETAMINOPHEN 7.5-325 MG PO TABS: 1.0000 | ORAL_TABLET | Freq: Four times a day (QID) | ORAL | 0 refills | Status: DC | PRN

## 2022-09-02 NOTE — Telephone Encounter (Signed)
The Dr gave me Orphenadrine and it's not helping at all in easing the pain in the shoulder/arm. Can you ask him if there is something a little stronger. The ache is still there and the meds are not doing anything to relieve the pain/ache.  Thanks ??  Alesia Morin Department Office: [phone]  Email: [email or web address] [address]  [place], [place] [address]  Web: www.[place].gov   Received above message from Elaria.  Sent text to Durward Parcel, PA-C.  He said to tell patient that he will send a Rx for 3 days of Tramadol into her pharmacy [facility] in [place], [place]) .  Also,said for her to keep taking the muscle relaxer & sterroid.  Informed Laasia above information.  Verbalized understanding.  AMD

## 2022-09-02 NOTE — Telephone Encounter (Signed)
Unable to refill per protocol, Rx refilled 09/02/22 for 30 days,duplicate request.  Requested Prescriptions  Pending Prescriptions Disp Refills   metoprolol succinate (TOPROL-XL) 50 MG 24 hr tablet [Pharmacy Med Name: METOPROLOL SUCC ER 50 MG TAB] 90 tablet 0    Sig: Take 1 tablet (50 mg total) by mouth daily. Take with or immediately following a meal.     Cardiovascular:  Beta Blockers Passed - 09/01/2022  2:41 PM      Passed - Last BP in normal range    BP Readings from Last 1 Encounters:  09/01/22 130/77         Passed - Last Heart Rate in normal range    Pulse Readings from Last 1 Encounters:  09/01/22 66         Passed - Valid encounter within last 6 months    Recent Outpatient Visits           4 months ago Benign essential hypertension   [facility] Danelle Berry, [place]   11 months ago Adult general medical exam   [facility] Danelle Berry, PA-C   1 year ago Benign essential hypertension   [facility] Danelle Berry, PA-C   1 year ago    [facility] Gabriel Cirri, NP   1 year ago Benign essential hypertension   [facility] Gabriel Cirri, NP       Future Appointments             In 1 month Danelle Berry, PA-C [facility], [facility]

## 2022-09-05 NOTE — Telephone Encounter (Signed)
Good Morning ??  I hope you had a great weekend!     Will you let the Dr know the meds he called in on Friday took the pain away but when it wears off it starts to ache again.  I think I'll go to [facility] today or tomorrow and let them figure out what is going on with my shoulder ??     [facility]  [employer] Department   Office: [phone]

## 2022-09-06 DIAGNOSIS — M24811 Other specific joint derangements of right shoulder, not elsewhere classified: Secondary | ICD-10-CM | POA: Diagnosis not present

## 2022-09-06 DIAGNOSIS — M25511 Pain in right shoulder: Secondary | ICD-10-CM | POA: Diagnosis not present

## 2022-09-09 ENCOUNTER — Encounter: Payer: Self-pay | Admitting: Cardiovascular Disease

## 2022-09-09 ENCOUNTER — Ambulatory Visit: Payer: 59 | Attending: Cardiovascular Disease | Admitting: Cardiovascular Disease

## 2022-09-09 VITALS — BP 114/60 | HR 79 | Ht 65.5 in | Wt 185.1 lb

## 2022-09-09 DIAGNOSIS — E785 Hyperlipidemia, unspecified: Secondary | ICD-10-CM | POA: Diagnosis not present

## 2022-09-09 DIAGNOSIS — I2510 Atherosclerotic heart disease of native coronary artery without angina pectoris: Secondary | ICD-10-CM

## 2022-09-09 DIAGNOSIS — I10 Essential (primary) hypertension: Secondary | ICD-10-CM | POA: Diagnosis not present

## 2022-09-09 DIAGNOSIS — Z720 Tobacco use: Secondary | ICD-10-CM | POA: Diagnosis not present

## 2022-09-09 NOTE — Patient Instructions (Signed)
Medication Instructions:  No changes *If you need a refill on your cardiac medications before your next appointment, please call your pharmacy*   Lab Work: None ordered If you have labs (blood work) drawn today and your tests are completely normal, you will receive your results only by: MyChart Message (if you have MyChart) OR A paper copy in the mail If you have any lab test that is abnormal or we need to change your treatment, we will call you to review the results.   Testing/Procedures: None ordered   Follow-Up: At [facility], you and your health needs are our priority.  As part of our continuing mission to provide you with exceptional heart care, we have created designated Provider Care Teams.  These Care Teams include your primary Cardiologist (physician) and Advanced Practice Providers (APPs -  Physician Assistants and Nurse Practitioners) who all work together to provide you with the care you need, when you need it.  We recommend signing up for the patient portal called "MyChart".  Sign up information is provided on this After Visit Summary.  MyChart is used to connect with patients for Virtual Visits (Telemedicine).  Patients are able to view lab/test results, encounter notes, upcoming appointments, etc.  Non-urgent messages can be sent to your provider as well.   To learn more about what you can do with MyChart, go to [email or web address].    Your next appointment:   12 month(s)  Provider:   You may see Dr. Arida or one of the following Advanced Practice Providers on your designated Care Team:   Christopher Berge, NP Ryan Dunn, PA-C Cadence Furth, PA-C Sheri Hammock, NP    

## 2022-09-09 NOTE — Progress Notes (Signed)
Cardiology Office Note   Date:  09/09/2022   ID:  Maria Franco, DOB [birth year], MRN [id number]  PCP:  Danelle Berry, PA-C  Cardiologist:   Lorine Bears, MD   Chief Complaint  Patient presents with   Follow-up    6 month f/u no complaints today. Meds reviewed verbally with pt.      History of Present Illness: Maria Franco is a 62 y.o. female who is here today for follow up visit regarding coronary artery disease.    She has known history of coronary artery disease with previous cardiac catheterization in 2006 that showed occluded mid LAD with right to left collaterals.  This was treated successfully with PCI and drug-eluting stent placement with a 3.0 x 23 mm Cypher stent. Other medical problems include tobacco use hyperlipidemia, essential hypertension and sleep apnea.  She had multiple stress test and echocardiogram throughout the years.  She works at the city of [employer].  She was adopted but recently met her biologic mother.  During last visit, I switched Brilinta to clopidogrel.  She has been doing very well with no chest pain, shortness of breath or palpitations.      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   evaluated be urology   Sleep apnea    no CPAP   Tricuspid valve regurgitation     Past Surgical History:  Procedure Laterality Date   COLONOSCOPY WITH PROPOFOL N/A 10/11/2019   Procedure: COLONOSCOPY WITH PROPOFOL;  Surgeon: Midge Minium, MD;  [facility];  Service: Endoscopy;  Laterality: N/A;  priority 4   CORONARY ANGIOPLASTY WITH STENT PLACEMENT  2006   x1    ENDOMETRIAL ABLATION  2012   HERNIA REPAIR     at 7 yrs of age   POLYPECTOMY  10/11/2019   Procedure: POLYPECTOMY;  Surgeon: Midge Minium, MD;  Location: [facility];  Service: Endoscopy;;   TUBAL LIGATION  2012     Current Outpatient Medications  Medication Sig Dispense Refill   amLODipine (NORVASC) 5 MG tablet Take 1 tablet (5 mg total) by mouth daily. 90 tablet 3   aspirin 81 MG tablet Take 81 mg by mouth daily.     chlorthalidone (HYGROTON) 25 MG tablet Take 1 tablet (25 mg total) by mouth daily. 90 tablet 3   Cholecalciferol (VITAMIN D3) 50 MCG (2000 UT) TABS Take by mouth.     clopidogrel (PLAVIX) 75 MG tablet Take 1 tablet (75 mg total) by mouth daily. 90 tablet 3   methylPREDNISolone (MEDROL DOSEPAK) 4 MG TBPK tablet Take Tapered dose as directed 21 tablet 0   metoprolol succinate (TOPROL-XL) 50 MG 24 hr tablet Take 1 tablet (50 mg total) by mouth daily. Take with or immediately following a meal. 30 tablet 0   Multiple Vitamins-Minerals (MULTIVITAMIN WOMEN PO) Take by mouth. Vitafusion for women gummies     nitroGLYCERIN (NITROSTAT) 0.4 MG SL tablet Place under the tongue.     orphenadrine (NORFLEX) 100 MG tablet Take  1 tablet (100 mg total) by mouth 2 (two) times daily. 10 tablet 0   oxyCODONE-acetaminophen (PERCOCET) 7.5-325 MG tablet Take 1 tablet by mouth every 6 (six) hours as needed for severe pain. 12 tablet 0   rosuvastatin (CRESTOR) 40 MG tablet Take 1 tablet (40 mg total) by mouth every morning. 90 tablet 3   valsartan (DIOVAN) 160 MG tablet Take 1 tablet (160 mg total) by mouth daily. 90 tablet 3   venlafaxine XR (EFFEXOR-XR) 75 MG 24 hr capsule Take 1 capsule (75 mg total) by mouth daily with breakfast. 90 capsule 3   zinc gluconate 50 MG tablet Take 50 mg by mouth daily.     No current facility-administered medications for this visit.    Allergies:   Dilaudid [hydromorphone hcl] and Hydromorphone    Social History:  The patient  reports that she has been  smoking cigarettes. She has a 30.00 pack-year smoking history. She has never used smokeless tobacco. She reports current alcohol use of about 6.0 standard drinks of alcohol per week. She reports that she does not use drugs.   Family History:  The patient's She was adopted. Family history is unknown by patient.    ROS:  Please see the history of present illness.   Otherwise, review of systems are positive for none.   All other systems are reviewed and negative.    PHYSICAL EXAM: VS:  BP 114/60 (BP Location: Left Arm, Patient Position: Sitting, Cuff Size: Large)   Pulse 79   Ht 5' 5.5" (1.664 m)   Wt 185 lb 2 oz (84 kg)   SpO2 96%   BMI 30.34 kg/m  , BMI Body mass index is 30.34 kg/m. GEN: Well nourished, well developed, in no acute distress  HEENT: normal  Neck: no JVD, carotid bruits, or masses Cardiac: RRR; no murmurs, rubs, or gallops,no edema  Respiratory:  clear to auscultation bilaterally, normal work of breathing GI: soft, nontender, nondistended, + BS MS: no deformity or atrophy  Skin: warm and dry, no rash Neuro:  Strength and sensation are intact Psych: euthymic mood, full affect   EKG:  EKG is ordered today. The ekg ordered today demonstrates normal sinus rhythm with no significant ST or T wave changes.   Recent Labs: No results found for requested labs within last 365 days.    Lipid Panel    Component Value Date/Time   CHOL 121 08/19/2021 0930   CHOL 144 04/23/2015 1018   TRIG 222 (H) 08/19/2021 0930   HDL 49 (L) 08/19/2021 0930   HDL 41 04/23/2015 1018   CHOLHDL 2.5 08/19/2021 0930   VLDL 23 06/27/2016 0827   LDLCALC 44 08/19/2021 0930      Wt Readings from Last 3 Encounters:  09/09/22 185 lb 2 oz (84 kg)  09/01/22 190 lb (86.2 kg)  04/08/22 191 lb (86.6 kg)          No data to display            ASSESSMENT AND PLAN:  1.  Coronary artery disease involving native coronary arteries without angina: She is doing well overall with no anginal  symptoms.  Continue long-term dual antiplatelet therapy as tolerated given that she had a first generation drug-eluting stent.    2.  Hyperlipidemia: Continue rosuvastatin 40 mg once daily.  I reviewed most recent lipid profile which showed an LDL of 44.  Triglyceride was mildly elevated at 222.  She is scheduled for a physical next month with labs.  3.  Essential hypertension: Blood pressure is well controlled on current medications.  4.  Tobacco use: I again discussed with her the importance of smoking cessation.    Disposition:   FU with me in 12 months  Signed,  Lorine Bears, MD  09/09/2022 10:31 AM    [facility]

## 2022-10-11 ENCOUNTER — Encounter: Payer: Self-pay | Admitting: Family Medicine

## 2022-10-11 ENCOUNTER — Ambulatory Visit (INDEPENDENT_AMBULATORY_CARE_PROVIDER_SITE_OTHER): Payer: 59 | Admitting: Family Medicine

## 2022-10-11 VITALS — BP 116/72 | HR 83 | Temp 98.1°F | Resp 16 | Ht 65.5 in | Wt 188.8 lb

## 2022-10-11 DIAGNOSIS — F419 Anxiety disorder, unspecified: Secondary | ICD-10-CM | POA: Diagnosis not present

## 2022-10-11 DIAGNOSIS — Z0000 Encounter for general adult medical examination without abnormal findings: Secondary | ICD-10-CM | POA: Diagnosis not present

## 2022-10-11 DIAGNOSIS — I10 Essential (primary) hypertension: Secondary | ICD-10-CM | POA: Diagnosis not present

## 2022-10-11 DIAGNOSIS — E782 Mixed hyperlipidemia: Secondary | ICD-10-CM | POA: Diagnosis not present

## 2022-10-11 DIAGNOSIS — E278 Other specified disorders of adrenal gland: Secondary | ICD-10-CM

## 2022-10-11 DIAGNOSIS — Z5181 Encounter for therapeutic drug level monitoring: Secondary | ICD-10-CM

## 2022-10-11 DIAGNOSIS — Z720 Tobacco use: Secondary | ICD-10-CM

## 2022-10-11 DIAGNOSIS — Z780 Asymptomatic menopausal state: Secondary | ICD-10-CM | POA: Diagnosis not present

## 2022-10-11 DIAGNOSIS — Z1231 Encounter for screening mammogram for malignant neoplasm of breast: Secondary | ICD-10-CM

## 2022-10-11 DIAGNOSIS — I2510 Atherosclerotic heart disease of native coronary artery without angina pectoris: Secondary | ICD-10-CM | POA: Diagnosis not present

## 2022-10-11 LAB — CBC WITH DIFFERENTIAL/PLATELET
Basophils Relative: 0.4 %
MCH: 32.1 pg (ref 27.0–33.0)
Neutro Abs: 9480 cells/uL — ABNORMAL HIGH (ref 1500–7800)
Neutrophils Relative %: 79 %
Platelets: 222 10*3/uL (ref 140–400)
RBC: 4.8 10*6/uL (ref 3.80–5.10)
Total Lymphocyte: 12.4 %
WBC: 12 10*3/uL — ABNORMAL HIGH (ref 3.8–10.8)

## 2022-10-11 MED ORDER — ROSUVASTATIN CALCIUM 40 MG PO TABS: 40.0000 mg | ORAL_TABLET | ORAL | 3 refills | Status: DC

## 2022-10-11 MED ORDER — AMLODIPINE BESYLATE 5 MG PO TABS: 5.0000 mg | ORAL_TABLET | Freq: Every day | ORAL | 1 refills | Status: DC

## 2022-10-11 MED ORDER — CHLORTHALIDONE 25 MG PO TABS: 25.0000 mg | ORAL_TABLET | Freq: Every day | ORAL | 1 refills | Status: DC

## 2022-10-11 MED ORDER — METOPROLOL SUCCINATE ER 50 MG PO TB24: 50.0000 mg | ORAL_TABLET | Freq: Every day | ORAL | 1 refills | Status: DC

## 2022-10-11 MED ORDER — VALSARTAN 160 MG PO TABS: 160.0000 mg | ORAL_TABLET | Freq: Every day | ORAL | 1 refills | Status: DC

## 2022-10-11 MED ORDER — VENLAFAXINE HCL ER 75 MG PO CP24
75.0000 mg | ORAL_CAPSULE | Freq: Every day | ORAL | 3 refills | Status: DC
Start: 2022-10-11 — End: 2023-10-11

## 2022-10-11 MED ORDER — CLOPIDOGREL BISULFATE 75 MG PO TABS: 75.0000 mg | ORAL_TABLET | Freq: Every day | ORAL | 1 refills | Status: DC

## 2022-10-11 NOTE — Progress Notes (Signed)
Patient: Maria Franco, Female    DOB: 1960-08-27, 62 y.o.   MRN: [id number] Danelle Berry, PA-C Visit Date: 10/11/2022  Today's Provider: Danelle Berry, PA-C   Chief Complaint  Patient presents with   Annual Exam   Subjective:   Annual physical exam:  Maria Franco is a 62 y.o. female who presents today for complete physical exam:  Exercise/Activity:  not exercising  Diet/nutrition:  no particular diet efforts Sleep:   no concerns  SDOH Screenings   Food Insecurity: No Food Insecurity (10/11/2022)  Housing: Low Risk  (10/11/2022)  Transportation Needs: No Transportation Needs (10/11/2022)  Utilities: Not At Risk (10/11/2022)  Alcohol Screen: Low Risk  (10/11/2022)  Depression (PHQ2-9): Low Risk  (10/11/2022)  Financial Resource Strain: Low Risk  (10/11/2022)  Physical Activity: Inactive (10/11/2022)  Social Connections: Moderately Integrated (10/11/2022)  Stress: No Stress Concern Present (10/11/2022)  Tobacco Use: High Risk (10/11/2022)    USPSTF grade A and B recommendations - reviewed and addressed today  Depression:  Phq 9 completed today by patient, was reviewed by me with patient in the room PHQ score is neg, pt feels good     10/11/2022    8:45 AM 04/08/2022    2:44 PM 10/01/2021    3:17 PM 08/19/2021    8:53 AM  PHQ 2/9 Scores  PHQ - 2 Score 0 0 0 0  PHQ- 9 Score 0 0 0       10/11/2022    8:45 AM 04/08/2022    2:44 PM 10/01/2021    3:17 PM 08/19/2021    8:53 AM 10/13/2020    3:39 PM  Depression screen PHQ 2/9  Decreased Interest 0 0 0 0 0  Down, Depressed, Hopeless 0 0 0 0 0  PHQ - 2 Score 0 0 0 0 0  Altered sleeping 0 0 0  0  Tired, decreased energy 0 0 0  0  Change in appetite 0 0 0  0  Feeling bad or failure about yourself  0 0 0  0  Trouble concentrating 0 0 0  0  Moving slowly or fidgety/restless 0 0 0  0  Suicidal thoughts 0 0 0  0  PHQ-9 Score 0 0 0  0  Difficult doing work/chores  Not difficult at all   Not difficult at all    Alcohol  screening: Flowsheet Row Office Visit from 10/11/2022 in [facility]  AUDIT-C Score 1       Immunizations and Health Maintenance: Health Maintenance  Topic Date Due   MAMMOGRAM  11/19/2013   Lung Cancer Screening  04/09/2023 (Originally 10/20/2012)   INFLUENZA VACCINE  12/01/2022   DTaP/Tdap/Td (3 - Td or Tdap) 11/16/2023   PAP SMEAR-Modifier  10/01/2024   Colonoscopy  10/10/2029   Hepatitis C Screening  Completed   HIV Screening  Completed   Zoster Vaccines- Shingrix  Completed   HPV VACCINES  Aged Out   COVID-19 Vaccine  Discontinued    Hep C Screening: done  STD testing and prevention (HIV/chl/gon/syphilis):  see above, no additional testing desired by pt today  Intimate partner violence:  safe -  Spouse and dog, no one else at home  Sexual History/Pain during Intercourse: Married not really active, no concerns  Menstrual History/LMP/Abnormal Bleeding: none, denies No LMP recorded. Patient has had an ablation.  Incontinence Symptoms: none  Breast cancer:   due Last Mammogram: *see HM list above  Cervical cancer screening:  UTD 2026  Osteoporosis:   recommend baseline dexa with next mammogram since multiple risk factors Discussion on osteoporosis per age, including high calcium and vitamin D supplementation, weight bearing exercises Pt is supplementing with daily calcium/Vit D.   Skin cancer:  Hx of skin CA -  NO   Discussed atypical lesions   Colorectal cancer:   Colonoscopy is due  Discussed concerning signs and sx of CRC, pt denies change in bowels, blood in stool, abd pain, weight loss  Lung cancer:  qualify but not interested Low Dose CT Chest recommended if Age 57-80 years, 20 pack-year currently smoking OR have quit w/in 15years. Patient does qualify.    Social History   Tobacco Use   Smoking status: Every Day    Packs/day: 1.00    Years: 30.00    Additional pack years: 0.00    Total pack years: 30.00    Types:  Cigarettes   Smokeless tobacco: Never   Tobacco comments:    41 years  Vaping Use   Vaping Use: Former  Substance Use Topics   Alcohol use: Yes    Alcohol/week: 6.0 standard drinks of alcohol    Types: 6 Glasses of wine per week    Comment: Occasionally   Drug use: No     Flowsheet Row Office Visit from 10/11/2022 in [facility]  AUDIT-C Score 1       Family History  Adopted: Yes  Family history unknown: Yes     Blood pressure/Hypertension: BP Readings from Last 3 Encounters:  10/11/22 116/72  09/09/22 114/60  09/01/22 130/77    Weight/Obesity: Wt Readings from Last 3 Encounters:  10/11/22 188 lb 12.8 oz (85.6 kg)  09/09/22 185 lb 2 oz (84 kg)  09/01/22 190 lb (86.2 kg)   BMI Readings from Last 3 Encounters:  10/11/22 30.94 kg/m  09/09/22 30.34 kg/m  09/01/22 31.62 kg/m     Lipids:  Lab Results  Component Value Date   CHOL 121 08/19/2021   CHOL 122 09/29/2020   CHOL 133 03/31/2020   Lab Results  Component Value Date   HDL 49 (L) 08/19/2021   HDL 45 (L) 09/29/2020   HDL 47 (L) 03/31/2020   Lab Results  Component Value Date   LDLCALC 44 08/19/2021   LDLCALC 49 09/29/2020   LDLCALC 57 03/31/2020   Lab Results  Component Value Date   TRIG 222 (H) 08/19/2021   TRIG 225 (H) 09/29/2020   TRIG 218 (H) 03/31/2020   Lab Results  Component Value Date   CHOLHDL 2.5 08/19/2021   CHOLHDL 2.7 09/29/2020   CHOLHDL 2.8 03/31/2020   No results found for: "LDLDIRECT" Based on the results of lipid panel his/her cardiovascular risk factor ( using [place] Cohort )  in the next 10 years is: The ASCVD Risk score (Arnett DK, et al., 2019) failed to calculate for the following reasons:   The valid total cholesterol range is 130 to 320 mg/dL  Glucose:  Glucose, Bld  Date Value Ref Range Status  08/19/2021 95 65 - 99 mg/dL Final    Comment:    .            Fasting reference interval .   09/29/2020 97 65 - 99 mg/dL Final     Comment:    .            Fasting reference interval .   03/31/2020 102 (H) 65 - 99 mg/dL Final    Comment:    .  Fasting reference interval . For someone without known diabetes, a glucose value between 100 and 125 mg/dL is consistent with prediabetes and should be confirmed with a follow-up test. .     Advanced Care Planning:  A voluntary discussion about advance care planning including the explanation and discussion of advance directives.   Discussed health care proxy and Living will, and the patient was able to identify a health care proxy as spouse.    Social History       Social History   Socioeconomic History   Marital status: Married    Spouse name: Steven"todd"   Number of children: Not on file   Years of education: 12   Highest education level: Bachelor's degree (e.g., BA, AB, BS)  Occupational History   Not on file  Tobacco Use   Smoking status: Every Day    Packs/day: 1.00    Years: 30.00    Additional pack years: 0.00    Total pack years: 30.00    Types: Cigarettes   Smokeless tobacco: Never   Tobacco comments:    39 years  Vaping Use   Vaping Use: Former  Substance and Sexual Activity   Alcohol use: Yes    Alcohol/week: 6.0 standard drinks of alcohol    Types: 6 Glasses of wine per week    Comment: Occasionally   Drug use: No   Sexual activity: Yes  Other Topics Concern   Not on file  Social History Narrative   Not on file   Social Determinants of Health   Financial Resource Strain: Low Risk  (10/11/2022)   Overall Financial Resource Strain (CARDIA)    Difficulty of Paying Living Expenses: Not hard at all  Food Insecurity: No Food Insecurity (10/11/2022)   Hunger Vital Sign    Worried About Running Out of Food in the Last Year: Never true    Ran Out of Food in the Last Year: Never true  Transportation Needs: No Transportation Needs (10/11/2022)   PRAPARE - Administrator, Civil Service (Medical): No    Lack of  Transportation (Non-Medical): No  Physical Activity: Inactive (10/11/2022)   Exercise Vital Sign    Days of Exercise per Week: 0 days    Minutes of Exercise per Session: 0 min  Stress: No Stress Concern Present (10/11/2022)   [employer] of Occupational Health - Occupational Stress Questionnaire    Feeling of Stress : Only a little  Social Connections: Moderately Integrated (10/11/2022)   Social Connection and Isolation Panel [NHANES]    Frequency of Communication with Friends and Family: More than three times a week    Frequency of Social Gatherings with Friends and Family: Once a week    Attends Religious Services: 1 to 4 times per year    Active Member of [employer] or Organizations: No    Attends Engineer, structural: Never    Marital Status: Married    Family History        Family History  Adopted: Yes  Family history unknown: Yes    Patient Active Problem List   Diagnosis Date Noted   Polyp of descending colon    Polyp of ascending colon    Benign essential hypertension 07/10/2019   Proteinuria 07/10/2019   Abnormal Papanicolaou smear of cervix with positive human papilloma virus (HPV) test 03/27/2018   Encounter for screening colonoscopy 03/26/2018   Obesity (BMI 30.0-34.9) 07/30/2017   Anxiety 10/31/2015   Status post coronary artery stent placement 11/21/2014  Hyperlipidemia    Panic attacks    CAD (coronary artery disease)    Tobacco use    Tricuspid valve regurgitation    MVP (mitral valve prolapse)    Left adrenal mass (HCC) 07/16/2013    Past Surgical History:  Procedure Laterality Date   COLONOSCOPY WITH PROPOFOL N/A 10/11/2019   Procedure: COLONOSCOPY WITH PROPOFOL;  Surgeon: Midge Minium, MD;  Location: [facility];  Service: Endoscopy;  Laterality: N/A;  priority 4   CORONARY ANGIOPLASTY WITH STENT PLACEMENT  2006   x1   ENDOMETRIAL ABLATION  2012   HERNIA REPAIR     at 7 yrs of age   POLYPECTOMY  10/11/2019   Procedure:  POLYPECTOMY;  Surgeon: Midge Minium, MD;  Location: [facility];  Service: Endoscopy;;   TUBAL LIGATION  2012     Current Outpatient Medications:    amLODipine (NORVASC) 5 MG tablet, Take 1 tablet (5 mg total) by mouth daily., Disp: 90 tablet, Rfl: 3   aspirin 81 MG tablet, Take 81 mg by mouth daily., Disp: , Rfl:    chlorthalidone (HYGROTON) 25 MG tablet, Take 1 tablet (25 mg total) by mouth daily., Disp: 90 tablet, Rfl: 3   Cholecalciferol (VITAMIN D3) 50 MCG (2000 UT) TABS, Take by mouth., Disp: , Rfl:    clopidogrel (PLAVIX) 75 MG tablet, Take 1 tablet (75 mg total) by mouth daily., Disp: 90 tablet, Rfl: 3   metoprolol succinate (TOPROL-XL) 50 MG 24 hr tablet, Take 1 tablet (50 mg total) by mouth daily. Take with or immediately following a meal., Disp: 30 tablet, Rfl: 0   Multiple Vitamins-Minerals (MULTIVITAMIN WOMEN PO), Take by mouth. Vitafusion for women gummies, Disp: , Rfl:    nitroGLYCERIN (NITROSTAT) 0.4 MG SL tablet, Place under the tongue., Disp: , Rfl:    rosuvastatin (CRESTOR) 40 MG tablet, Take 1 tablet (40 mg total) by mouth every morning., Disp: 90 tablet, Rfl: 3   valsartan (DIOVAN) 160 MG tablet, Take 1 tablet (160 mg total) by mouth daily., Disp: 90 tablet, Rfl: 3   venlafaxine XR (EFFEXOR-XR) 75 MG 24 hr capsule, Take 1 capsule (75 mg total) by mouth daily with breakfast., Disp: 90 capsule, Rfl: 3   zinc gluconate 50 MG tablet, Take 50 mg by mouth daily., Disp: , Rfl:   Allergies  Allergen Reactions   Dilaudid [Hydromorphone Hcl] Nausea And Vomiting   Hydromorphone Nausea Only and Nausea And Vomiting    Patient Care Team: Danelle Berry, PA-C as PCP - General (Family Medicine) Laurier Nancy, MD as Consulting Physician (Cardiology) Carlus Pavlov, MD as Consulting Physician (Endocrinology) Lorraine Lax, MD as Referring Physician (Urology)   Chart Review: I personally reviewed active problem list, medication list, allergies, family history, social  history, health maintenance, notes from last encounter, lab results, imaging with the patient/caregiver today.   Review of Systems  Constitutional: Negative.   HENT: Negative.    Eyes: Negative.   Respiratory: Negative.    Cardiovascular: Negative.   Gastrointestinal: Negative.   Endocrine: Negative.   Genitourinary: Negative.   Musculoskeletal: Negative.   Skin: Negative.   Allergic/Immunologic: Negative.   Neurological: Negative.   Hematological: Negative.   Psychiatric/Behavioral: Negative.    All other systems reviewed and are negative.         Objective:   Vitals:  Vitals:   10/11/22 0837  BP: 116/72  Pulse: 83  Resp: 16  Temp: 98.1 F (36.7 C)  TempSrc: Oral  SpO2: 98%  Weight:  188 lb 12.8 oz (85.6 kg)  Height: 5' 5.5" (1.664 m)    Body mass index is 30.94 kg/m.  Physical Exam Vitals and nursing note reviewed.  Constitutional:      General: She is not in acute distress.    Appearance: Normal appearance. She is well-developed, well-groomed and overweight. She is not ill-appearing, toxic-appearing or diaphoretic.  HENT:     Head: Normocephalic and atraumatic.     Right Ear: External ear normal.     Left Ear: External ear normal.     Nose: Nose normal.     Mouth/Throat:     Mouth: Mucous membranes are moist.     Pharynx: Oropharynx is clear.  Eyes:     General: Lids are normal. No scleral icterus.       Right eye: No discharge.        Left eye: No discharge.     Conjunctiva/sclera: Conjunctivae normal.  Neck:     Thyroid: No thyroid mass or thyromegaly.     Trachea: Trachea and phonation normal. No tracheal deviation.  Cardiovascular:     Rate and Rhythm: Normal rate and regular rhythm.     Pulses: Normal pulses.          Radial pulses are 2+ on the right side and 2+ on the left side.       Posterior tibial pulses are 2+ on the right side and 2+ on the left side.     Heart sounds: Normal heart sounds. No murmur heard.    No friction rub. No  gallop.  Pulmonary:     Effort: Pulmonary effort is normal. No respiratory distress.     Breath sounds: Normal breath sounds. No stridor. No wheezing, rhonchi or rales.  Chest:     Chest wall: No tenderness.  Abdominal:     General: Bowel sounds are normal. There is no distension.     Palpations: Abdomen is soft.  Musculoskeletal:     Cervical back: Normal range of motion.     Right lower leg: No edema.     Left lower leg: No edema.  Skin:    General: Skin is warm and dry.     Coloration: Skin is not jaundiced or pale.     Findings: No bruising or rash.  Neurological:     Mental Status: She is alert. Mental status is at baseline.     Motor: No abnormal muscle tone.     Gait: Gait normal.  Psychiatric:        Mood and Affect: Mood normal.        Speech: Speech normal.        Behavior: Behavior normal. Behavior is cooperative.       Fall Risk:    10/11/2022    8:45 AM 04/08/2022    2:44 PM 10/01/2021    3:18 PM 08/19/2021    8:53 AM 10/13/2020    3:38 PM  Fall Risk   Falls in the past year? 0 0 0 0 0  Number falls in past yr:  0   0  Injury with Fall?  0   0  Risk for fall due to : No Fall Risks No Fall Risks No Fall Risks No Fall Risks   Follow up Falls prevention discussed;Education provided;Falls evaluation completed Falls prevention discussed;Education provided;Falls evaluation completed Falls prevention discussed;Education provided;Falls evaluation completed Falls prevention discussed Falls evaluation completed    Functional Status Survey: Is the patient deaf or have difficulty hearing?:  No Does the patient have difficulty seeing, even when wearing glasses/contacts?: No Does the patient have difficulty concentrating, remembering, or making decisions?: No Does the patient have difficulty walking or climbing stairs?: No Does the patient have difficulty dressing or bathing?: No Does the patient have difficulty doing errands alone such as visiting a doctor's office or  shopping?: No   Assessment & Plan:    CPE completed today  USPSTF grade A and B recommendations reviewed with patient; age-appropriate recommendations, preventive care, screening tests, etc discussed and encouraged; healthy living encouraged; see AVS for patient education given to patient  Discussed importance of 150 minutes of physical activity weekly, AHA exercise recommendations given to pt in AVS/handout  Discussed importance of healthy diet:  eating lean meats and proteins, avoiding trans fats and saturated fats, avoid simple sugars and excessive carbs in diet, eat 6 servings of fruit/vegetables daily and drink plenty of water and avoid sweet beverages.    Recommended pt to do annual eye exam and routine dental exams/cleanings  Depression, alcohol, fall screening completed as documented above and per flowsheets  Advance Care planning information and packet discussed and offered today, encouraged pt to discuss with family members/spouse/partner/friends and complete Advanced directive packet and bring copy to office   Reviewed Health Maintenance: Health Maintenance  Topic Date Due   MAMMOGRAM  11/19/2013   Lung Cancer Screening  04/09/2023 (Originally 10/20/2012)   INFLUENZA VACCINE  12/01/2022   DTaP/Tdap/Td (3 - Td or Tdap) 11/16/2023   PAP SMEAR-Modifier  10/01/2024   Colonoscopy  10/10/2029   Hepatitis C Screening  Completed   HIV Screening  Completed   Zoster Vaccines- Shingrix  Completed   HPV VACCINES  Aged Out   COVID-19 Vaccine  Discontinued    Immunizations: Immunization History  Administered Date(s) Administered   Pneumococcal Polysaccharide-23 03/16/2007   Td 12/25/2002   Tdap 11/15/2013   Zoster Recombinat (Shingrix) 10/04/2019, 01/04/2020   Vaccines:  HPV: up to at age 56 , ask insurance if age between 47-45  Shingrix: 37-64 yo and ask insurance if covered when patient above 39 yo Pneumonia: discussed option for booster with PCV20 educated and discussed  with patient. Flu:  educated and discussed with patient. COVID:      ICD-10-CM   1. Adult general medical exam  Z00.00 CBC with Differential/Platelet    COMPLETE METABOLIC PANEL WITH GFR    Lipid panel    Hemoglobin A1c    2. Encounter for screening mammogram for malignant neoplasm of breast  Z12.31 MM 3D SCREENING MAMMOGRAM BILATERAL BREAST    CANCELED: MM 3D SCREENING MAMMOGRAM BILATERAL BREAST    3. Postmenopausal estrogen deficiency  Z78.0 DG Bone Density    CANCELED: DG Bone Density    4. Benign essential hypertension  I10 COMPLETE METABOLIC PANEL WITH GFR    metoprolol succinate (TOPROL-XL) 50 MG 24 hr tablet    valsartan (DIOVAN) 160 MG tablet    amLODipine (NORVASC) 5 MG tablet    chlorthalidone (HYGROTON) 25 MG tablet   bp at goal today meds refilled    5. Tobacco use  Z72.0 CBC with Differential/Platelet    COMPLETE METABOLIC PANEL WITH GFR    Lipid panel   pt not interested in smoking cessation or lung cancer screening    6. Mixed hyperlipidemia  E78.2 COMPLETE METABOLIC PANEL WITH GFR    Lipid panel    rosuvastatin (CRESTOR) 40 MG tablet   due for labs, on statin    7. Encounter for medication  monitoring  Z51.81 CBC with Differential/Platelet    COMPLETE METABOLIC PANEL WITH GFR    Lipid panel    Hemoglobin A1c    8. Coronary artery disease involving native coronary artery of native heart, unspecified whether angina present  I25.10 rosuvastatin (CRESTOR) 40 MG tablet    metoprolol succinate (TOPROL-XL) 50 MG 24 hr tablet    valsartan (DIOVAN) 160 MG tablet   per cardiology, no current exertional sx    9. Anxiety  F41.9 venlafaxine XR (EFFEXOR-XR) 75 MG 24 hr capsule   sx stable and well controlled on venlafaxine, phq reviewed          Danelle Berry, PA-C 10/11/22 9:01 AM  [facility]

## 2022-10-12 LAB — COMPLETE METABOLIC PANEL WITH GFR
AG Ratio: 1.9 (calc) (ref 1.0–2.5)
ALT: 22 U/L (ref 6–29)
AST: 16 U/L (ref 10–35)
Albumin: 4.2 g/dL (ref 3.6–5.1)
Alkaline phosphatase (APISO): 77 U/L (ref 37–153)
BUN: 14 mg/dL (ref 7–25)
CO2: 28 mmol/L (ref 20–32)
Calcium: 9.3 mg/dL (ref 8.6–10.4)
Chloride: 104 mmol/L (ref 98–110)
Creat: 1 mg/dL (ref 0.50–1.03)
Globulin: 2.2 g/dL (calc) (ref 1.9–3.7)
Glucose, Bld: 85 mg/dL (ref 65–99)
Potassium: 3.8 mmol/L (ref 3.5–5.3)
Sodium: 141 mmol/L (ref 135–146)
Total Bilirubin: 0.4 mg/dL (ref 0.2–1.2)
Total Protein: 6.4 g/dL (ref 6.1–8.1)
eGFR: 65 mL/min/{1.73_m2} (ref >=60)

## 2022-10-12 LAB — CBC WITH DIFFERENTIAL/PLATELET
Absolute Monocytes: 876 cells/uL (ref 200–950)
Basophils Absolute: 48 cells/uL (ref 0–200)
Eosinophils Absolute: 108 cells/uL (ref 15–500)
Eosinophils Relative: 0.9 %
HCT: 45.7 % — ABNORMAL HIGH (ref 35.0–45.0)
Hemoglobin: 15.4 g/dL (ref 11.7–15.5)
Lymphs Abs: 1488 cells/uL (ref 850–3900)
MCHC: 33.7 g/dL (ref 32.0–36.0)
MCV: 95.2 fL (ref 80.0–100.0)
MPV: 9.8 fL (ref 7.5–12.5)
Monocytes Relative: 7.3 %
RDW: 13 % (ref 11.0–15.0)

## 2022-10-12 LAB — HEMOGLOBIN A1C
Hgb A1c MFr Bld: 5.7 % of total Hgb — ABNORMAL HIGH (ref <5.7)
Mean Plasma Glucose: 117 mg/dL
eAG (mmol/L): 6.5 mmol/L

## 2022-10-12 LAB — LIPID PANEL
Cholesterol: 131 mg/dL (ref <200)
HDL: 48 mg/dL — ABNORMAL LOW (ref >=50)
LDL Cholesterol (Calc): 58 mg/dL (calc)
Non-HDL Cholesterol (Calc): 83 mg/dL (calc) (ref <130)
Total CHOL/HDL Ratio: 2.7 (calc) (ref <5.0)
Triglycerides: 167 mg/dL — ABNORMAL HIGH (ref <150)

## 2022-10-13 ENCOUNTER — Encounter: Payer: Self-pay | Admitting: Family Medicine

## 2022-10-13 DIAGNOSIS — R7303 Prediabetes: Secondary | ICD-10-CM | POA: Insufficient documentation

## 2022-10-19 ENCOUNTER — Encounter: Payer: Self-pay | Admitting: Family Medicine

## 2022-10-19 NOTE — Patient Instructions (Signed)
Health Maintenance  Topic Date Due   Mammogram  11/19/2013   Screening for Lung Cancer  04/09/2023*   Flu Shot  12/01/2022   DTaP/Tdap/Td vaccine (3 - Td or Tdap) 11/16/2023   Pap Smear  10/01/2024   Colon Cancer Screening  10/10/2029   Hepatitis C Screening  Completed   HIV Screening  Completed   Zoster (Shingles) Vaccine  Completed   HPV Vaccine  Aged Out   COVID-19 Vaccine  Discontinued  *Topic was postponed. The date shown is not the original due date.     Preventive Care 46-51 Years Old, Female Preventive care refers to lifestyle choices and visits with your health care provider that can promote health and wellness. Preventive care visits are also called wellness exams. What can I expect for my preventive care visit? Counseling Your health care provider may ask you questions about your: Medical history, including: Past medical problems. Family medical history. Pregnancy history. Current health, including: Menstrual cycle. Method of birth control. Emotional well-being. Home life and relationship well-being. Sexual activity and sexual health. Lifestyle, including: Alcohol, nicotine or tobacco, and drug use. Access to firearms. Diet, exercise, and sleep habits. Work and work Astronomer. Sunscreen use. Safety issues such as seatbelt and bike helmet use. Physical exam Your health care provider will check your: Height and weight. These may be used to calculate your BMI (body mass index). BMI is a measurement that tells if you are at a healthy weight. Waist circumference. This measures the distance around your waistline. This measurement also tells if you are at a healthy weight and may help predict your risk of certain diseases, such as type 2 diabetes and high blood pressure. Heart rate and blood pressure. Body temperature. Skin for abnormal spots. What immunizations do I need?  Vaccines are usually given at various ages, according to a schedule. Your health care  provider will recommend vaccines for you based on your age, medical history, and lifestyle or other factors, such as travel or where you work. What tests do I need? Screening Your health care provider may recommend screening tests for certain conditions. This may include: Lipid and cholesterol levels. Diabetes screening. This is done by checking your blood sugar (glucose) after you have not eaten for a while (fasting). Pelvic exam and Pap test. Hepatitis B test. Hepatitis C test. HIV (human immunodeficiency virus) test. STI (sexually transmitted infection) testing, if you are at risk. Lung cancer screening. Colorectal cancer screening. Mammogram. Talk with your health care provider about when you should start having regular mammograms. This may depend on whether you have a family history of breast cancer. BRCA-related cancer screening. This may be done if you have a family history of breast, ovarian, tubal, or peritoneal cancers. Bone density scan. This is done to screen for osteoporosis. Talk with your health care provider about your test results, treatment options, and if necessary, the need for more tests. Follow these instructions at home: Eating and drinking  Eat a diet that includes fresh fruits and vegetables, whole grains, lean protein, and low-fat dairy products. Take vitamin and mineral supplements as recommended by your health care provider. Do not drink alcohol if: Your health care provider tells you not to drink. You are pregnant, may be pregnant, or are planning to become pregnant. If you drink alcohol: Limit how much you have to 0-1 drink a day. Know how much alcohol is in your drink. In the [place] one drink equals one 12 oz bottle of beer (355 mL), one  5 oz glass of wine (148 mL), or one 1 oz glass of hard liquor (44 mL). Lifestyle Brush your teeth every morning and night with fluoride toothpaste. Floss one time each day. Exercise for at least 30 minutes 5 or more days  each week. Do not use any products that contain nicotine or tobacco. These products include cigarettes, chewing tobacco, and vaping devices, such as e-cigarettes. If you need help quitting, ask your health care provider. Do not use drugs. If you are sexually active, practice safe sex. Use a condom or other form of protection to prevent STIs. If you do not wish to become pregnant, use a form of birth control. If you plan to become pregnant, see your health care provider for a prepregnancy visit. Take aspirin only as told by your health care provider. Make sure that you understand how much to take and what form to take. Work with your health care provider to find out whether it is safe and beneficial for you to take aspirin daily. Find healthy ways to manage stress, such as: Meditation, yoga, or listening to music. Journaling. Talking to a trusted person. Spending time with friends and family. Minimize exposure to UV radiation to reduce your risk of skin cancer. Safety Always wear your seat belt while driving or riding in a vehicle. Do not drive: If you have been drinking alcohol. Do not ride with someone who has been drinking. When you are tired or distracted. While texting. If you have been using any mind-altering substances or drugs. Wear a helmet and other protective equipment during sports activities. If you have firearms in your house, make sure you follow all gun safety procedures. Seek help if you have been physically or sexually abused. What's next? Visit your health care provider once a year for an annual wellness visit. Ask your health care provider how often you should have your eyes and teeth checked. Stay up to date on all vaccines. This information is not intended to replace advice given to you by your health care provider. Make sure you discuss any questions you have with your health care provider. Document Revised: 10/14/2020 Document Reviewed: 10/14/2020 Elsevier Patient  Education  2024 [employer].  Preventing Osteoporosis, Adult Osteoporosis is a condition that causes the bones to lose density. This means that the bones become thinner, and the normal spaces in bone tissue become larger. Low bone density can make the bones weak and cause them to break more easily. Osteoporosis cannot always be prevented, but you can take steps to lower your risk of developing this condition. How can this condition affect me? If you develop osteoporosis, you will be more likely to break bones in your wrist, spine, or hip. Even a minor accident or injury can be enough to break weak bones. The bones will also be slower to heal. Osteoporosis can cause other problems as well, such as a stooped posture or trouble with movement. Osteoporosis can occur with aging. As you get older, you may lose bone tissue more quickly, or it may be replaced more slowly. Osteoporosis is more likely to develop if you have poor nutrition or do not get enough calcium or vitamin D. Other lifestyle factors can also play a role. By eating a well-balanced diet and making lifestyle changes, you can help keep your bones strong and healthy, lowering your chances of developing osteoporosis. What can increase my risk? The following factors may make you more likely to develop osteoporosis: Having a family history of the condition.  Having poor nutrition or not getting enough calcium or vitamin D. Using certain medicines, such as steroid medicines or anti-seizure medicines. Being any of the following: 47 years of age or older. Female. A woman who has gone through menopause (is postmenopausal). A person who is of European or Asian descent. Using products that contain nicotine or tobacco, such as cigarettes, e-cigarettes, and chewing tobacco. Not being physically active (being sedentary). Having a small body frame. What actions can I take to prevent this? Get enough calcium  Make sure you get enough calcium every  day. Calcium is the most important mineral for bone health. Most people can get enough calcium from their diet, but supplements may be recommended for people who are at risk for osteoporosis. Follow these guidelines: If you are age 45 or younger, aim to get 1,000 milligrams (mg) of calcium every day. If you are older than age 66, aim to get 1,200 mg of calcium every day. Good sources of calcium include: Dairy products, such as low-fat or nonfat milk, cheese, and yogurt. Dark green leafy vegetables, such as bok choy and broccoli. Foods that have had calcium added to them (calcium-fortified foods), such as orange juice, cereal, bread, soy beverages, and tofu products. Nuts, such as almonds. Check nutrition labels to see how much calcium is in a food or drink. Get enough vitamin D Try to get enough vitamin D every day. Vitamin D is the most essential vitamin for bone health. It helps the body absorb calcium. Follow these guidelines for how much vitamin D to get from food: If you are age 57 or younger, aim to get at least 600 international units (IU) every day. Your health care provider may suggest more. If you are older than age 62, aim to get at least 800 international units every day. Your health care provider may suggest more. Good sources of vitamin D in your diet include: Egg yolks. Oily fish, such as salmon, sardines, and tuna. Milk and cereal fortified with vitamin D. Your body also makes vitamin D when you are out in the sun. Exposing the bare skin on your face, arms, legs, or back to the sun for no more than 30 minutes a day, 2 times a week is more than enough. Beyond that, make sure you use sunblock to protect your skin from sunburn, which increases your risk for skin cancer. Exercise  Stay active and get exercise every day. Ask your health care provider what types of exercise are best for you. Weight-bearing and strength-building activities are important for building and maintaining  healthy bones. Some examples of these types of activities include: Walking and hiking. Jogging and running. Dancing. Gym exercises and lifting weights. Tennis and racquetball. Climbing stairs. Tai chi. Make other lifestyle changes Do not use any products that contain nicotine or tobacco, such as cigarettes, e-cigarettes, and chewing tobacco. If you need help quitting, ask your health care provider. Lose weight if you are overweight. If you drink alcohol: Limit how much you use to: 0-1 drink a day for women who are not pregnant. 0-2 drinks a day for men. Be aware of how much alcohol is in your drink. In the [place] one drink equals one 12 oz bottle of beer (355 mL), one 5 oz glass of wine (148 mL), or one 1 oz glass of hard liquor (44 mL). Where to find support If you need help making changes to prevent osteoporosis, talk with your health care provider. You can ask for a referral to  a dietitian and a physical therapist. Where to find more information Learn more about osteoporosis from: NIH Osteoporosis and Related Bone Diseases National Resource Center: www.bones.http://www.myers.net/ [place] Office on [employer] Health: http://hoffman.com/ National Osteoporosis Foundation: [employer] Summary Osteoporosis is a condition that causes weak bones that are more likely to break. Eat a healthy diet, making sure you get enough calcium and vitamin D, and stay active by getting regular exercise to help prevent osteoporosis. Other ways to reduce your risk of osteoporosis include maintaining a healthy weight and avoiding alcohol and products that contain nicotine or tobacco. This information is not intended to replace advice given to you by your health care provider. Make sure you discuss any questions you have with your health care provider. Document Revised: 10/03/2019 Document Reviewed: 10/03/2019 Elsevier Patient Education  2024 [employer].

## 2022-11-09 DIAGNOSIS — Z1231 Encounter for screening mammogram for malignant neoplasm of breast: Secondary | ICD-10-CM | POA: Diagnosis not present

## 2022-11-09 DIAGNOSIS — M85852 Other specified disorders of bone density and structure, left thigh: Secondary | ICD-10-CM | POA: Diagnosis not present

## 2022-11-09 DIAGNOSIS — Z780 Asymptomatic menopausal state: Secondary | ICD-10-CM | POA: Diagnosis not present

## 2022-11-09 LAB — HM MAMMOGRAPHY

## 2022-11-09 LAB — HM DEXA SCAN

## 2022-12-01 ENCOUNTER — Telehealth: Payer: Self-pay | Admitting: Cardiovascular Disease

## 2022-12-01 DIAGNOSIS — S8391XA Sprain of unspecified site of right knee, initial encounter: Secondary | ICD-10-CM | POA: Diagnosis not present

## 2022-12-01 NOTE — Telephone Encounter (Signed)
Patient has been made aware and verbalized her understanding.  

## 2022-12-01 NOTE — Telephone Encounter (Signed)
Pt is requesting a callback to let her know if its ok for her to take Aleve for swelling after leaving [facility] due to her twisting her knee.

## 2022-12-01 NOTE — Telephone Encounter (Signed)
We prefer that she does not use Aleve given that she is on aspirin and Plavix.  However, if it is absolutely needed, it can be used on a short-term basis.

## 2022-12-14 DIAGNOSIS — S8391XA Sprain of unspecified site of right knee, initial encounter: Secondary | ICD-10-CM | POA: Diagnosis not present

## 2022-12-21 DIAGNOSIS — M25561 Pain in right knee: Secondary | ICD-10-CM | POA: Diagnosis not present

## 2022-12-29 DIAGNOSIS — M25561 Pain in right knee: Secondary | ICD-10-CM | POA: Diagnosis not present

## 2023-01-05 DIAGNOSIS — M25561 Pain in right knee: Secondary | ICD-10-CM | POA: Diagnosis not present

## 2023-01-09 DIAGNOSIS — M25561 Pain in right knee: Secondary | ICD-10-CM | POA: Diagnosis not present

## 2023-01-10 DIAGNOSIS — M25561 Pain in right knee: Secondary | ICD-10-CM | POA: Diagnosis not present

## 2023-01-20 DIAGNOSIS — M25561 Pain in right knee: Secondary | ICD-10-CM | POA: Diagnosis not present

## 2023-02-01 ENCOUNTER — Ambulatory Visit: Payer: Self-pay

## 2023-02-01 VITALS — BP 132/84

## 2023-02-01 DIAGNOSIS — Z1152 Encounter for screening for COVID-19: Secondary | ICD-10-CM

## 2023-02-01 LAB — POC COVID19 BINAXNOW: SARS Coronavirus 2 Ag: NEGATIVE

## 2023-02-01 NOTE — Progress Notes (Signed)
Pt presents today for BP check. Pt states she's been having a headache for two days.

## 2023-02-02 ENCOUNTER — Telehealth: Payer: Self-pay | Admitting: Cardiovascular Disease

## 2023-02-02 NOTE — Telephone Encounter (Signed)
Patient said that she went to her occupational health and they said that her BP was slightly elevated

## 2023-02-02 NOTE — Telephone Encounter (Signed)
Spoke w/ pt.  She reports a slight HA for the past 2 days. She went to OT yesterday for COVID testing and BP check: 132/84. She was advised that she may be slightly dehydrated and to increase her water intake. I reiterated this to her and asked her to continue to monitor her BP at home. She was  advised to take Tylenol and given samples of Sqwincher, an electrolyte replacement, but has not drank any yet. Advised her to try this and increase her overall water intake and call back if her HA has not resolved.

## 2023-02-03 ENCOUNTER — Ambulatory Visit: Payer: 59

## 2023-02-03 ENCOUNTER — Encounter: Payer: Self-pay | Admitting: Cardiovascular Disease

## 2023-02-03 ENCOUNTER — Telehealth: Payer: Self-pay | Admitting: Cardiovascular Disease

## 2023-02-03 VITALS — BP 126/74 | HR 76 | Resp 16

## 2023-02-03 DIAGNOSIS — R519 Headache, unspecified: Secondary | ICD-10-CM

## 2023-02-03 LAB — POC COVID19 BINAXNOW: SARS Coronavirus 2 Ag: NEGATIVE
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
## 2023-02-03 NOTE — Telephone Encounter (Signed)
Maria Franco   AF   02/03/23  2:28 PM Note Patient is calling because she still has her headache (see phone encounter 10/03). Patient stated she has not taken her BP today, but has been drinking more water. Please advise.

## 2023-02-03 NOTE — Progress Notes (Signed)
BP checked at request with c/o headache.  Covid test positive.  Denies any other c/o and will seek attention if s/s do not subside.

## 2023-02-03 NOTE — Telephone Encounter (Signed)
From:Sascha A Fass      Sent:02/03/2023  4:07 PM EDT        NW:[id number] Arida   Subject:BP/Heart Rate  Hey Mandy:  I just got back from [facility] and here are my results...  BP 126/74 Heart Rate 76

## 2023-02-03 NOTE — Telephone Encounter (Signed)
Please see phone note from 10/3.

## 2023-02-03 NOTE — Telephone Encounter (Signed)
Patient is calling because she still has her headache (see phone encounter 10/03). Patient stated she has not taken her BP today, but has been drinking more water. Please advise.

## 2023-02-08 NOTE — Telephone Encounter (Signed)
Her blood pressure readings seem fine to me.  I think she should follow-up with her primary care physician about her other complaints.

## 2023-02-10 NOTE — Telephone Encounter (Signed)
Pt made aware and verbalized understanding.

## 2023-04-14 ENCOUNTER — Ambulatory Visit: Payer: 59 | Admitting: Physician Assistant

## 2023-04-21 ENCOUNTER — Encounter: Payer: Self-pay | Admitting: Physician Assistant

## 2023-04-21 ENCOUNTER — Ambulatory Visit (INDEPENDENT_AMBULATORY_CARE_PROVIDER_SITE_OTHER): Payer: 59 | Admitting: Physician Assistant

## 2023-04-21 VITALS — BP 130/80 | HR 89 | Resp 16 | Ht 65.0 in | Wt 192.0 lb

## 2023-04-21 DIAGNOSIS — Z780 Asymptomatic menopausal state: Secondary | ICD-10-CM

## 2023-04-21 DIAGNOSIS — I10 Essential (primary) hypertension: Secondary | ICD-10-CM

## 2023-04-21 DIAGNOSIS — E782 Mixed hyperlipidemia: Secondary | ICD-10-CM

## 2023-04-21 DIAGNOSIS — F419 Anxiety disorder, unspecified: Secondary | ICD-10-CM | POA: Diagnosis not present

## 2023-04-21 DIAGNOSIS — R7303 Prediabetes: Secondary | ICD-10-CM | POA: Diagnosis not present

## 2023-04-21 DIAGNOSIS — I2510 Atherosclerotic heart disease of native coronary artery without angina pectoris: Secondary | ICD-10-CM | POA: Diagnosis not present

## 2023-04-21 MED ORDER — VALSARTAN 160 MG PO TABS: 160.0000 mg | ORAL_TABLET | Freq: Every day | ORAL | 1 refills | Status: DC

## 2023-04-21 MED ORDER — METOPROLOL SUCCINATE ER 50 MG PO TB24: 50.0000 mg | ORAL_TABLET | Freq: Every day | ORAL | 1 refills | Status: DC

## 2023-04-21 MED ORDER — CHLORTHALIDONE 25 MG PO TABS: 25.0000 mg | ORAL_TABLET | Freq: Every day | ORAL | 1 refills | Status: DC

## 2023-04-21 MED ORDER — AMLODIPINE BESYLATE 5 MG PO TABS: 5.0000 mg | ORAL_TABLET | Freq: Every day | ORAL | 1 refills | Status: DC

## 2023-04-21 MED ORDER — CLOPIDOGREL BISULFATE 75 MG PO TABS: 75.0000 mg | ORAL_TABLET | Freq: Every day | ORAL | 1 refills | Status: DC

## 2023-04-21 NOTE — Progress Notes (Unsigned)
Established Patient Office Visit  Name: Maria Franco   MRN: [id number]    DOB: 12-31-1960   Date:04/22/2023  Today's Provider: Jacquelin Hawking, MHS, PA-C Introduced myself to the patient as a PA-C and provided education on APPs in clinical practice.         Subjective  Chief Complaint  Chief Complaint  Patient presents with   Medical Management of Chronic Issues    6 months   Hypertension    HPI  HYPERTENSION / HYPERLIPIDEMIA Satisfied with current treatment? yes Duration of hypertension: years BP monitoring frequency: not checking BP range: NA BP medication side effects: no  BP meds: Amlodipine 5 mg POQD, Chlorthalidone 25 mg PO every day, Valsartan 160 mg PO every day Duration of hyperlipidemia: years Cholesterol medication side effects: yes- reports knee aches  Cholesterol supplements: none  cholesterol medications: rosuvastatin (crestor) Medication compliance: good compliance Aspirin: yes Recent stressors: no Recurrent headaches: no Visual changes: no Palpitations: no Dyspnea: no Chest pain: no Lower extremity edema: no Dizzy/lightheaded: no  Anxiety   She reports her mood is okay for right now  She is taking Venlafaxine       10/11/2022    8:46 AM 04/08/2022    2:57 PM 10/01/2021    3:19 PM 03/31/2020    3:56 PM  GAD 7 : Generalized Anxiety Score  Nervous, Anxious, on Edge 0 0 0 0  Control/stop worrying 0 0 0 0  Worry too much - different things 0 0 0 0  Trouble relaxing 0 0 0 0  Restless 0 0 0 0  Easily annoyed or irritable 0 0 0 0  Afraid - awful might happen 0 0 0 0  Total GAD 7 Score 0 0 0 0  Anxiety Difficulty  Not difficult at all  Not difficult at all       10/11/2022    8:45 AM 04/08/2022    2:44 PM 10/01/2021    3:17 PM 08/19/2021    8:53 AM 10/13/2020    3:39 PM  Depression screen PHQ 2/9  Decreased Interest 0 0 0 0 0  Down, Depressed, Hopeless 0 0 0 0 0  PHQ - 2 Score 0 0 0 0 0  Altered sleeping 0 0 0  0  Tired, decreased  energy 0 0 0  0  Change in appetite 0 0 0  0  Feeling bad or failure about yourself  0 0 0  0  Trouble concentrating 0 0 0  0  Moving slowly or fidgety/restless 0 0 0  0  Suicidal thoughts 0 0 0  0  PHQ-9 Score 0 0 0  0  Difficult doing work/chores  Not difficult at all   Not difficult at all     Patient Active Problem List   Diagnosis Date Noted   Prediabetes 10/13/2022   Polyp of descending colon    Polyp of ascending colon    Benign essential hypertension 07/10/2019   Proteinuria 07/10/2019   Abnormal Papanicolaou smear of cervix with positive human papilloma virus (HPV) test 03/27/2018   Encounter for screening colonoscopy 03/26/2018   Obesity (BMI 30.0-34.9) 07/30/2017   Anxiety 10/31/2015   Status post coronary artery stent placement 11/21/2014   Hyperlipidemia    Panic attacks    CAD (coronary artery disease)    Tobacco use    Tricuspid valve regurgitation    MVP (mitral valve prolapse)    Left adrenal mass (HCC) 07/16/2013  Past Surgical History:  Procedure Laterality Date   COLONOSCOPY WITH PROPOFOL N/A 10/11/2019   Procedure: COLONOSCOPY WITH PROPOFOL;  Surgeon: Midge Minium, MD;  Location: [facility];  Service: Endoscopy;  Laterality: N/A;  priority 4   CORONARY ANGIOPLASTY WITH STENT PLACEMENT  2006   x1   ENDOMETRIAL ABLATION  2012   HERNIA REPAIR     at 7 yrs of age   POLYPECTOMY  10/11/2019   Procedure: POLYPECTOMY;  Surgeon: Midge Minium, MD;  Location: [facility];  Service: Endoscopy;;   TUBAL LIGATION  2012    Family History  Adopted: Yes  Family history unknown: Yes    Social History   Tobacco Use   Smoking status: Every Day    Current packs/day: 1.00    Average packs/day: 1 pack/day for 30.0 years (30.0 ttl pk-yrs)    Types: Cigarettes   Smokeless tobacco: Never   Tobacco comments:    39 years  Substance Use Topics   Alcohol use: Yes    Alcohol/week: 6.0 standard drinks of alcohol    Types: 6 Glasses of wine per  week    Comment: Occasionally     Current Outpatient Medications:    aspirin 81 MG tablet, Take 81 mg by mouth daily., Disp: , Rfl:    Cholecalciferol (VITAMIN D3) 50 MCG (2000 UT) TABS, Take by mouth., Disp: , Rfl:    Multiple Vitamins-Minerals (MULTIVITAMIN WOMEN PO), Take by mouth. Vitafusion for women gummies, Disp: , Rfl:    nitroGLYCERIN (NITROSTAT) 0.4 MG SL tablet, Place under the tongue., Disp: , Rfl:    rosuvastatin (CRESTOR) 40 MG tablet, Take 1 tablet (40 mg total) by mouth every morning., Disp: 90 tablet, Rfl: 3   venlafaxine XR (EFFEXOR-XR) 75 MG 24 hr capsule, Take 1 capsule (75 mg total) by mouth daily with breakfast., Disp: 90 capsule, Rfl: 3   zinc gluconate 50 MG tablet, Take 50 mg by mouth daily., Disp: , Rfl:    amLODipine (NORVASC) 5 MG tablet, Take 1 tablet (5 mg total) by mouth daily., Disp: 90 tablet, Rfl: 1   chlorthalidone (HYGROTON) 25 MG tablet, Take 1 tablet (25 mg total) by mouth daily., Disp: 90 tablet, Rfl: 1   clopidogrel (PLAVIX) 75 MG tablet, Take 1 tablet (75 mg total) by mouth daily., Disp: 90 tablet, Rfl: 1   metoprolol succinate (TOPROL-XL) 50 MG 24 hr tablet, Take 1 tablet (50 mg total) by mouth daily. Take with or immediately following a meal., Disp: 90 tablet, Rfl: 1   valsartan (DIOVAN) 160 MG tablet, Take 1 tablet (160 mg total) by mouth daily., Disp: 90 tablet, Rfl: 1  Allergies  Allergen Reactions   Dilaudid [Hydromorphone Hcl] Nausea And Vomiting   Hydromorphone Nausea Only and Nausea And Vomiting    I personally reviewed active problem list, medication list, allergies, notes from last encounter, lab results with the patient/caregiver today.   ROS  See HPI for relevant ROS    Objective  Vitals:   04/21/23 1518  BP: 130/80  Pulse: 89  Resp: 16  SpO2: 98%  Weight: 192 lb (87.1 kg)  Height: 5\' 5"  (1.651 m)    Body mass index is 31.95 kg/m.  Physical Exam Vitals reviewed.  Constitutional:      General: She is awake.      Appearance: Normal appearance. She is well-developed and well-groomed.  HENT:     Head: Normocephalic and atraumatic.  Cardiovascular:     Rate and Rhythm: Normal rate and  regular rhythm.     Pulses: Normal pulses.          Radial pulses are 2+ on the right side and 2+ on the left side.     Heart sounds: Normal heart sounds. No murmur heard.    No friction rub. No gallop.  Pulmonary:     Effort: Pulmonary effort is normal.     Breath sounds: Normal breath sounds. No decreased air movement. No decreased breath sounds, wheezing, rhonchi or rales.  Musculoskeletal:     Cervical back: Normal range of motion.     Right lower leg: No edema.     Left lower leg: No edema.  Neurological:     General: No focal deficit present.     Mental Status: She is alert and oriented to person, place, and time. Mental status is at baseline.     GCS: GCS eye subscore is 4. GCS verbal subscore is 5. GCS motor subscore is 6.  Psychiatric:        Attention and Perception: Attention and perception normal.        Mood and Affect: Mood and affect normal.        Speech: Speech normal.        Behavior: Behavior normal. Behavior is cooperative.        Thought Content: Thought content normal.        Cognition and Memory: Cognition normal.      Recent Results (from the past 2160 hours)  POC COVID-19 [facility]Now     Status: Normal   Collection Time: 02/01/23  4:25 PM  Result Value Ref Range   SARS Coronavirus 2 Ag Negative Negative  POC COVID-19     Status: Normal   Collection Time: 02/03/23  3:46 PM  Result Value Ref Range   SARS Coronavirus 2 Ag Negative Negative     PHQ2/9:    10/11/2022    8:45 AM 04/08/2022    2:44 PM 10/01/2021    3:17 PM 08/19/2021    8:53 AM 10/13/2020    3:39 PM  Depression screen PHQ 2/9  Decreased Interest 0 0 0 0 0  Down, Depressed, Hopeless 0 0 0 0 0  PHQ - 2 Score 0 0 0 0 0  Altered sleeping 0 0 0  0  Tired, decreased energy 0 0 0  0  Change in appetite 0 0 0  0  Feeling  bad or failure about yourself  0 0 0  0  Trouble concentrating 0 0 0  0  Moving slowly or fidgety/restless 0 0 0  0  Suicidal thoughts 0 0 0  0  PHQ-9 Score 0 0 0  0  Difficult doing work/chores  Not difficult at all   Not difficult at all      Fall Risk:    10/11/2022    8:45 AM 04/08/2022    2:44 PM 10/01/2021    3:18 PM 08/19/2021    8:53 AM 10/13/2020    3:38 PM  Fall Risk   Falls in the past year? 0 0 0 0 0  Number falls in past yr:  0   0  Injury with Fall?  0   0  Risk for fall due to : No Fall Risks No Fall Risks No Fall Risks No Fall Risks   Follow up Falls prevention discussed;Education provided;Falls evaluation completed Falls prevention discussed;Education provided;Falls evaluation completed Falls prevention discussed;Education provided;Falls evaluation completed Falls prevention discussed Falls evaluation completed  Functional Status Survey:      Assessment & Plan  Problem List Items Addressed This Visit       Cardiovascular and Mediastinum   CAD (coronary artery disease) (Chronic)   Chronic, historic condition Currently taking rosuvastatin 40 mg p.o. daily and appears to be tolerating well Continue current regimen Continue follow-up with cardiology as indicated Follow-up at PCP office in 6 months or sooner if concerns arise      Relevant Medications   amLODipine (NORVASC) 5 MG tablet   chlorthalidone (HYGROTON) 25 MG tablet   metoprolol succinate (TOPROL-XL) 50 MG 24 hr tablet   valsartan (DIOVAN) 160 MG tablet   Benign essential hypertension - Primary   Chronic, historic condition Appears well-managed on current regimen comprised of amlodipine 5 mg p.o. daily, chlorthalidone 25 mg p.o. daily, metoprolol 50 mg p.o. daily, valsartan 160 mg p.o. daily BP is close to goal in office today Recommend that she checks BP at home and keep a log for monitoring For now continue current regimen Refills provided Follow-up in 6 months or sooner if concerns  arise      Relevant Medications   amLODipine (NORVASC) 5 MG tablet   chlorthalidone (HYGROTON) 25 MG tablet   metoprolol succinate (TOPROL-XL) 50 MG 24 hr tablet   valsartan (DIOVAN) 160 MG tablet     Other   Hyperlipidemia   Chronic, historic condition She is currently taking rosuvastatin 40 mg p.o. daily and appears to be tolerating well Continue current regimen Check lipid panel at follow-up Follow-up in 6 months or sooner if concerns arise      Relevant Medications   amLODipine (NORVASC) 5 MG tablet   chlorthalidone (HYGROTON) 25 MG tablet   metoprolol succinate (TOPROL-XL) 50 MG 24 hr tablet   valsartan (DIOVAN) 160 MG tablet   Anxiety   Chronic, historic condition She reports that she is satisfied with her mood and is currently taking venlafaxine 75 mg p.o. daily.  She appears to be tolerating well Continue current regimen Follow-up in 6 months or sooner if concerns arise      Prediabetes   Chronic, ongoing Recommend reducing excess sugar and carbs in diet Recheck A1c at follow-up Follow-up in 6 months or sooner if concerns arise        No follow-ups on file.   I, Jordell Outten E Mardell Suttles, PA-C, have reviewed all documentation for this visit. The documentation on 04/22/23 for the exam, diagnosis, procedures, and orders are all accurate and complete.   Jacquelin Hawking, MHS, PA-C [facility]

## 2023-04-22 NOTE — Assessment & Plan Note (Signed)
Chronic, historic condition Appears well-managed on current regimen comprised of amlodipine 5 mg p.o. daily, chlorthalidone 25 mg p.o. daily, metoprolol 50 mg p.o. daily, valsartan 160 mg p.o. daily BP is close to goal in office today Recommend that she checks BP at home and keep a log for monitoring For now continue current regimen Refills provided Follow-up in 6 months or sooner if concerns arise

## 2023-04-22 NOTE — Assessment & Plan Note (Signed)
Chronic, historic condition She is currently taking rosuvastatin 40 mg p.o. daily and appears to be tolerating well Continue current regimen Check lipid panel at follow-up Follow-up in 6 months or sooner if concerns arise

## 2023-04-22 NOTE — Assessment & Plan Note (Signed)
Chronic, ongoing Recommend reducing excess sugar and carbs in diet Recheck A1c at follow-up Follow-up in 6 months or sooner if concerns arise

## 2023-04-22 NOTE — Assessment & Plan Note (Signed)
Chronic, historic condition Currently taking rosuvastatin 40 mg p.o. daily and appears to be tolerating well Continue current regimen Continue follow-up with cardiology as indicated Follow-up at PCP office in 6 months or sooner if concerns arise

## 2023-04-22 NOTE — Assessment & Plan Note (Signed)
Chronic, historic condition She reports that she is satisfied with her mood and is currently taking venlafaxine 75 mg p.o. daily.  She appears to be tolerating well Continue current regimen Follow-up in 6 months or sooner if concerns arise

## 2023-05-30 NOTE — Progress Notes (Signed)
Erroneous

## 2023-08-09 DIAGNOSIS — H5213 Myopia, bilateral: Secondary | ICD-10-CM | POA: Diagnosis not present

## 2023-10-12 ENCOUNTER — Ambulatory Visit (INDEPENDENT_AMBULATORY_CARE_PROVIDER_SITE_OTHER): Admitting: Family Medicine

## 2023-10-12 ENCOUNTER — Encounter: Payer: Self-pay | Admitting: Family Medicine

## 2023-10-12 VITALS — BP 118/60 | HR 74 | Resp 16 | Ht 65.0 in | Wt 190.0 lb

## 2023-10-12 DIAGNOSIS — I10 Essential (primary) hypertension: Secondary | ICD-10-CM

## 2023-10-12 DIAGNOSIS — R7303 Prediabetes: Secondary | ICD-10-CM

## 2023-10-12 DIAGNOSIS — E66811 Obesity, class 1: Secondary | ICD-10-CM | POA: Diagnosis not present

## 2023-10-12 DIAGNOSIS — I2510 Atherosclerotic heart disease of native coronary artery without angina pectoris: Secondary | ICD-10-CM

## 2023-10-12 DIAGNOSIS — F419 Anxiety disorder, unspecified: Secondary | ICD-10-CM | POA: Diagnosis not present

## 2023-10-12 DIAGNOSIS — Z1231 Encounter for screening mammogram for malignant neoplasm of breast: Secondary | ICD-10-CM

## 2023-10-12 DIAGNOSIS — F17200 Nicotine dependence, unspecified, uncomplicated: Secondary | ICD-10-CM | POA: Diagnosis not present

## 2023-10-12 DIAGNOSIS — Z0000 Encounter for general adult medical examination without abnormal findings: Secondary | ICD-10-CM | POA: Diagnosis not present

## 2023-10-12 DIAGNOSIS — Z6831 Body mass index (BMI) 31.0-31.9, adult: Secondary | ICD-10-CM

## 2023-10-12 DIAGNOSIS — E782 Mixed hyperlipidemia: Secondary | ICD-10-CM

## 2023-10-12 LAB — CBC WITH DIFFERENTIAL/PLATELET
Absolute Lymphocytes: 2037 {cells}/uL (ref 850–3900)
Absolute Monocytes: 893 {cells}/uL (ref 200–950)
Basophils Absolute: 28 {cells}/uL (ref 0–200)
Basophils Relative: 0.3 %
Eosinophils Absolute: 158 {cells}/uL (ref 15–500)
Eosinophils Relative: 1.7 %
HCT: 47.7 % — ABNORMAL HIGH (ref 35.0–45.0)
Hemoglobin: 15.9 g/dL — ABNORMAL HIGH (ref 11.7–15.5)
MCH: 32.1 pg (ref 27.0–33.0)
MCHC: 33.3 g/dL (ref 32.0–36.0)
MCV: 96.4 fL (ref 80.0–100.0)
MPV: 10.2 fL (ref 7.5–12.5)
Monocytes Relative: 9.6 %
Neutro Abs: 6185 {cells}/uL (ref 1500–7800)
Neutrophils Relative %: 66.5 %
Platelets: 230 10*3/uL (ref 140–400)
RBC: 4.95 10*6/uL (ref 3.80–5.10)
RDW: 13.1 % (ref 11.0–15.0)
Total Lymphocyte: 21.9 %
WBC: 9.3 10*3/uL (ref 3.8–10.8)

## 2023-10-12 LAB — COMPREHENSIVE METABOLIC PANEL WITH GFR
AG Ratio: 1.6 (calc) (ref 1.0–2.5)
ALT: 20 U/L (ref 6–29)
AST: 20 U/L (ref 10–35)
Albumin: 4.4 g/dL (ref 3.6–5.1)
Alkaline phosphatase (APISO): 81 U/L (ref 37–153)
BUN/Creatinine Ratio: 15 (calc) (ref 6–22)
BUN: 17 mg/dL (ref 7–25)
CO2: 30 mmol/L (ref 20–32)
Calcium: 9.7 mg/dL (ref 8.6–10.4)
Chloride: 103 mmol/L (ref 98–110)
Creat: 1.11 mg/dL — ABNORMAL HIGH (ref 0.50–1.05)
Globulin: 2.7 g/dL (ref 1.9–3.7)
Glucose, Bld: 100 mg/dL — ABNORMAL HIGH (ref 65–99)
Potassium: 3.8 mmol/L (ref 3.5–5.3)
Sodium: 141 mmol/L (ref 135–146)
Total Bilirubin: 0.5 mg/dL (ref 0.2–1.2)
Total Protein: 7.1 g/dL (ref 6.1–8.1)
eGFR: 57 mL/min/{1.73_m2} — ABNORMAL LOW (ref >=60)

## 2023-10-12 LAB — LIPID PANEL
Cholesterol: 146 mg/dL (ref <200)
HDL: 49 mg/dL — ABNORMAL LOW (ref >=50)
LDL Cholesterol (Calc): 72 mg/dL
Non-HDL Cholesterol (Calc): 97 mg/dL (ref <130)
Total CHOL/HDL Ratio: 3 (calc) (ref <5.0)
Triglycerides: 174 mg/dL — ABNORMAL HIGH (ref <150)

## 2023-10-12 LAB — HEMOGLOBIN A1C
Hgb A1c MFr Bld: 5.6 % (ref <5.7)
Mean Plasma Glucose: 114 mg/dL
eAG (mmol/L): 6.3 mmol/L

## 2023-10-12 MED ORDER — METOPROLOL SUCCINATE ER 50 MG PO TB24
50.0000 mg | ORAL_TABLET | Freq: Every day | ORAL | 1 refills | Status: AC
Start: 2023-10-12 — End: ?

## 2023-10-12 MED ORDER — AMLODIPINE BESYLATE 5 MG PO TABS
5.0000 mg | ORAL_TABLET | Freq: Every day | ORAL | 1 refills | Status: AC
Start: 2023-10-12 — End: ?

## 2023-10-12 MED ORDER — VENLAFAXINE HCL ER 75 MG PO CP24: 75.0000 mg | ORAL_CAPSULE | Freq: Every day | ORAL | 3 refills | Status: AC

## 2023-10-12 MED ORDER — CHLORTHALIDONE 25 MG PO TABS
25.0000 mg | ORAL_TABLET | Freq: Every day | ORAL | 1 refills | Status: AC
Start: 2023-10-12 — End: ?

## 2023-10-12 MED ORDER — SEMAGLUTIDE-WEIGHT MANAGEMENT 0.5 MG/0.5ML ~~LOC~~ SOAJ: 0.5000 mg | SUBCUTANEOUS | 1 refills | Status: DC

## 2023-10-12 MED ORDER — VALSARTAN 160 MG PO TABS
160.0000 mg | ORAL_TABLET | Freq: Every day | ORAL | 1 refills | Status: AC
Start: 2023-10-12 — End: ?

## 2023-10-12 MED ORDER — SEMAGLUTIDE-WEIGHT MANAGEMENT 0.25 MG/0.5ML ~~LOC~~ SOAJ: 0.2500 mg | SUBCUTANEOUS | Status: AC

## 2023-10-12 MED ORDER — ROSUVASTATIN CALCIUM 40 MG PO TABS: 40.0000 mg | ORAL_TABLET | ORAL | 3 refills | Status: AC

## 2023-10-12 MED ORDER — SEMAGLUTIDE-WEIGHT MANAGEMENT 0.25 MG/0.5ML ~~LOC~~ SOAJ
0.2500 mg | SUBCUTANEOUS | 0 refills | Status: AC
Start: 2023-10-12 — End: 2023-11-09

## 2023-10-12 NOTE — Patient Instructions (Addendum)
 Ask your insurance if they cover wegovy for weight loss of heart disease   Health Maintenance  Topic Date Due   Screening for Lung Cancer  10/10/2024*   Pneumococcal Vaccination (2 of 2 - PCV) 10/11/2024*   Mammogram  11/09/2023   DTaP/Tdap/Td vaccine (3 - Td or Tdap) 11/16/2023   Flu Shot  12/01/2023   Pap with HPV screening  10/02/2026   Colon Cancer Screening  10/10/2029   Hepatitis C Screening  Completed   HIV Screening  Completed   Zoster (Shingles) Vaccine  Completed   HPV Vaccine  Aged Out   Meningitis B Vaccine  Aged Out   COVID-19 Vaccine  Discontinued  *Topic was postponed. The date shown is not the original due date.

## 2023-10-12 NOTE — Progress Notes (Signed)
 Patient: Maria Franco, Female    DOB: 1960/05/29, 63 y.o.   MRN: [id number] Adeline Hone, PA-C Visit Date: 10/12/2023  Today's Provider: Adeline Hone, PA-C   Chief Complaint  Patient presents with   Annual Exam   Subjective:   Annual physical exam:  Maria Franco is a 63 y.o. female who presents today for complete physical exam:  Exercise/Activity:  not much exercise  Diet/nutrition:  her diet is healthy but she reports just overeating healthy food Sleep:  no concerns  Health and weight - asks about GLP-1 No hx of pancreatitis, MTC MEN   SDOH Screenings   Food Insecurity: No Food Insecurity (10/12/2023)  Housing: Unknown (10/12/2023)  Transportation Needs: No Transportation Needs (10/12/2023)  Utilities: Not At Risk (10/12/2023)  Alcohol Screen: Low Risk  (10/12/2023)  Depression (PHQ2-9): Low Risk  (10/12/2023)  Financial Resource Strain: Low Risk  (10/12/2023)  Physical Activity: Insufficiently Active (04/17/2023)  Social Connections: Moderately Integrated (10/12/2023)  Stress: Stress Concern Present (10/12/2023)  Tobacco Use: High Risk (10/12/2023)  Health Literacy: Adequate Health Literacy (10/12/2023)    USPSTF grade A and B recommendations - reviewed and addressed today  Depression:  Phq 9 completed today by patient, was reviewed by me with patient in the room PHQ score is neg, pt feels good     10/12/2023   10:17 AM 10/11/2022    8:45 AM 04/08/2022    2:44 PM 10/01/2021    3:17 PM  PHQ 2/9 Scores  PHQ - 2 Score 0 0 0 0  PHQ- 9 Score  0 0 0      10/12/2023   10:17 AM 10/11/2022    8:45 AM 04/08/2022    2:44 PM 10/01/2021    3:17 PM 08/19/2021    8:53 AM  Depression screen PHQ 2/9  Decreased Interest 0 0 0 0 0  Down, Depressed, Hopeless 0 0 0 0 0  PHQ - 2 Score 0 0 0 0 0  Altered sleeping  0 0 0   Tired, decreased energy  0 0 0   Change in appetite  0 0 0   Feeling bad or failure about yourself   0 0 0   Trouble concentrating  0 0 0   Moving slowly or  fidgety/restless  0 0 0   Suicidal thoughts  0 0 0   PHQ-9 Score  0 0 0   Difficult doing work/chores   Not difficult at all     Alcohol screening: Flowsheet Row Office Visit from 10/12/2023 in [facility]  AUDIT-C Score 0   Immunizations and Health Maintenance: Health Maintenance  Topic Date Due   Lung Cancer Screening  10/10/2024 (Originally 10/20/2012)   Pneumococcal Vaccine 72-6 Years old (2 of 2 - PCV) 10/11/2024 (Originally 03/15/2008)   MAMMOGRAM  11/09/2023   DTaP/Tdap/Td (3 - Td or Tdap) 11/16/2023   INFLUENZA VACCINE  12/01/2023   Cervical Cancer Screening (HPV/Pap Cotest)  10/02/2026   Colonoscopy  10/10/2029   Hepatitis C Screening  Completed   HIV Screening  Completed   Zoster Vaccines- Shingrix  Completed   HPV VACCINES  Aged Out   Meningococcal B Vaccine  Aged Out   COVID-19 Vaccine  Discontinued     Hep C Screening: done  STD testing and prevention (HIV/chl/gon/syphilis):  see above, no additional testing desired by pt today  Intimate partner violence:safe, denies  Sexual History/Pain during Intercourse: Married  Menstrual History/LMP/Abnormal Bleeding: No AUB No LMP recorded.  Patient has had an ablation.  Incontinence Symptoms: none, very mild  Breast cancer: due July  Last Mammogram: *see HM list above BRCA gene screening:   Cervical cancer screening: UTD not due until 2028  Osteoporosis:   Discussion on osteoporosis per age, including high calcium  and vitamin D supplementation, weight bearing exercises Done July 2024- reviewed Bone scan/dexa  Skin cancer:  Hx of skin CA -  NO Discussed atypical lesions   Colorectal cancer:   Colonoscopy is UTD (she says she was supposed to go back in 3 months?  Will need to review and update)   Lung cancer:   Low Dose CT Chest recommended if Age 32-80 years, 20 pack-year currently smoking OR have quit w/in 15years. Patient does qualify.    Social History   Tobacco Use    Smoking status: Every Day    Current packs/day: 1.00    Average packs/day: 1 pack/day for 30.0 years (30.0 ttl pk-yrs)    Types: Cigarettes   Smokeless tobacco: Never   Tobacco comments:    39 years  Vaping Use   Vaping status: Former  Substance Use Topics   Alcohol use: Yes    Alcohol/week: 6.0 standard drinks of alcohol    Types: 6 Glasses of wine per week    Comment: Occasionally   Drug use: No     Flowsheet Row Office Visit from 10/12/2023 in [facility]  AUDIT-C Score 0    Family History  Adopted: Yes  Family history unknown: Yes     Blood pressure/Hypertension: BP Readings from Last 3 Encounters:  10/12/23 118/60  04/21/23 130/80  02/03/23 126/74    Weight/Obesity: Wt Readings from Last 3 Encounters:  10/12/23 190 lb (86.2 kg)  04/21/23 192 lb (87.1 kg)  10/11/22 188 lb 12.8 oz (85.6 kg)   BMI Readings from Last 3 Encounters:  10/12/23 31.62 kg/m  04/21/23 31.95 kg/m  10/11/22 30.94 kg/m     Lipids:  Lab Results  Component Value Date   CHOL 131 10/11/2022   CHOL 121 08/19/2021   CHOL 122 09/29/2020   Lab Results  Component Value Date   HDL 48 (L) 10/11/2022   HDL 49 (L) 08/19/2021   HDL 45 (L) 09/29/2020   Lab Results  Component Value Date   LDLCALC 58 10/11/2022   LDLCALC 44 08/19/2021   LDLCALC 49 09/29/2020   Lab Results  Component Value Date   TRIG 167 (H) 10/11/2022   TRIG 222 (H) 08/19/2021   TRIG 225 (H) 09/29/2020   Lab Results  Component Value Date   CHOLHDL 2.7 10/11/2022   CHOLHDL 2.5 08/19/2021   CHOLHDL 2.7 09/29/2020   No results found for: LDLDIRECT Based on the results of lipid panel his/her cardiovascular risk factor ( using Poole Cohort )  in the next 10 years is: The 10-year ASCVD risk score (Arnett DK, et al., 2019) is: 6.2%   Values used to calculate the score:     Age: 61 years     Clincally relevant sex: Female     Is Non-Hispanic African American: No     Diabetic: No      Tobacco smoker: Yes     Systolic Blood Pressure: 118 mmHg     Is BP treated: Yes     HDL Cholesterol: 48 mg/dL     Total Cholesterol: 131 mg/dL  Glucose:  Glucose, Bld  Date Value Ref Range Status  10/11/2022 85 65 - 99 mg/dL Final  Comment:    .            Fasting reference interval .   08/19/2021 95 65 - 99 mg/dL Final    Comment:    .            Fasting reference interval .   09/29/2020 97 65 - 99 mg/dL Final    Comment:    .            Fasting reference interval .     Advanced Care Planning:  A voluntary discussion about advance care planning including the explanation and discussion of advance directives.   Discussed health care proxy and Living will, and the patient was able to identify a health care proxy as .   Patient does not have a living will at present time.   Social History       Social History   Socioeconomic History   Marital status: Married    Spouse name: Steventodd   Number of children: Not on file   Years of education: 12   Highest education level: Bachelor's degree (e.g., BA, AB, BS)  Occupational History   Not on file  Tobacco Use   Smoking status: Every Day    Current packs/day: 1.00    Average packs/day: 1 pack/day for 30.0 years (30.0 ttl pk-yrs)    Types: Cigarettes   Smokeless tobacco: Never   Tobacco comments:    39 years  Vaping Use   Vaping status: Former  Substance and Sexual Activity   Alcohol use: Yes    Alcohol/week: 6.0 standard drinks of alcohol    Types: 6 Glasses of wine per week    Comment: Occasionally   Drug use: No   Sexual activity: Yes  Other Topics Concern   Not on file  Social History Narrative   Not on file   Social Drivers of Health   Financial Resource Strain: Low Risk  (10/12/2023)   Overall Financial Resource Strain (CARDIA)    Difficulty of Paying Living Expenses: Not hard at all  Food Insecurity: No Food Insecurity (10/12/2023)   Hunger Vital Sign    Worried About Running Out of Food in  the Last Year: Never true    Ran Out of Food in the Last Year: Never true  Transportation Needs: No Transportation Needs (10/12/2023)   PRAPARE - Administrator, Civil Service (Medical): No    Lack of Transportation (Non-Medical): No  Physical Activity: Insufficiently Active (04/17/2023)   Exercise Vital Sign    Days of Exercise per Week: 1 day    Minutes of Exercise per Session: 10 min  Stress: Stress Concern Present (10/12/2023)   [employer] of Occupational Health - Occupational Stress Questionnaire    Feeling of Stress: To some extent  Social Connections: Moderately Integrated (10/12/2023)   Social Connection and Isolation Panel    Frequency of Communication with Friends and Family: More than three times a week    Frequency of Social Gatherings with Friends and Family: Twice a week    Attends Religious Services: More than 4 times per year    Active Member of [employer] or Organizations: No    Attends Banker Meetings: Never    Marital Status: Married    Family History        Family History  Adopted: Yes  Family history unknown: Yes    Patient Active Problem List   Diagnosis Date Noted   Prediabetes 10/13/2022   Polyp of  descending colon    Polyp of ascending colon    Benign essential hypertension 07/10/2019   Proteinuria 07/10/2019   Abnormal Papanicolaou smear of cervix with positive human papilloma virus (HPV) test 03/27/2018   Encounter for screening colonoscopy 03/26/2018   Obesity (BMI 30.0-34.9) 07/30/2017   Anxiety 10/31/2015   Status post coronary artery stent placement 11/21/2014   Hyperlipidemia    Panic attacks    CAD (coronary artery disease)    Tobacco use    Tricuspid valve regurgitation    MVP (mitral valve prolapse)    Left adrenal mass (HCC) 07/16/2013    Past Surgical History:  Procedure Laterality Date   COLONOSCOPY WITH PROPOFOL  N/A 10/11/2019   Procedure: COLONOSCOPY WITH PROPOFOL ;  Surgeon: Marnee Sink, MD;   Location: [facility];  Service: Endoscopy;  Laterality: N/A;  priority 4   CORONARY ANGIOPLASTY WITH STENT PLACEMENT  2006   x1   ENDOMETRIAL ABLATION  2012   HERNIA REPAIR     at 7 yrs of age   POLYPECTOMY  10/11/2019   Procedure: POLYPECTOMY;  Surgeon: Marnee Sink, MD;  Location: [facility];  Service: Endoscopy;;   TUBAL LIGATION  2012     Current Outpatient Medications:    amLODipine  (NORVASC ) 5 MG tablet, Take 1 tablet (5 mg total) by mouth daily., Disp: 90 tablet, Rfl: 1   aspirin 81 MG tablet, Take 81 mg by mouth daily., Disp: , Rfl:    chlorthalidone  (HYGROTON ) 25 MG tablet, Take 1 tablet (25 mg total) by mouth daily., Disp: 90 tablet, Rfl: 1   Cholecalciferol (VITAMIN D3) 50 MCG (2000 UT) TABS, Take by mouth., Disp: , Rfl:    metoprolol  succinate (TOPROL -XL) 50 MG 24 hr tablet, Take 1 tablet (50 mg total) by mouth daily. Take with or immediately following a meal., Disp: 90 tablet, Rfl: 1   Multiple Vitamins-Minerals (MULTIVITAMIN WOMEN PO), Take by mouth. Vitafusion for women gummies, Disp: , Rfl:    nitroGLYCERIN (NITROSTAT) 0.4 MG SL tablet, Place under the tongue., Disp: , Rfl:    rosuvastatin  (CRESTOR ) 40 MG tablet, Take 1 tablet (40 mg total) by mouth every morning., Disp: 90 tablet, Rfl: 3   ticagrelor  (BRILINTA ) 60 MG TABS tablet, Take 60 mg by mouth daily., Disp: , Rfl:    valsartan  (DIOVAN ) 160 MG tablet, Take 1 tablet (160 mg total) by mouth daily., Disp: 90 tablet, Rfl: 1   venlafaxine  XR (EFFEXOR -XR) 75 MG 24 hr capsule, Take 1 capsule (75 mg total) by mouth daily with breakfast., Disp: 90 capsule, Rfl: 3   zinc gluconate 50 MG tablet, Take 50 mg by mouth daily., Disp: , Rfl:    clopidogrel  (PLAVIX ) 75 MG tablet, Take 1 tablet (75 mg total) by mouth daily. (Patient not taking: Reported on 10/12/2023), Disp: 90 tablet, Rfl: 1  Allergies  Allergen Reactions   Dilaudid [Hydromorphone Hcl] Nausea And Vomiting   Hydromorphone Nausea Only and Nausea And  Vomiting    Patient Care Team: Tonja Jezewski, PA-C as PCP - General (Family Medicine) Cherrie Cornwall, MD as Consulting Physician (Cardiology) Emilie Harden, MD as Consulting Physician (Endocrinology) Michelina Aho, MD as Referring Physician (Urology)   Chart Review: I personally reviewed active problem list, medication list, allergies, family history, social history, health maintenance, notes from last encounter, lab results, imaging with the patient/caregiver today.   Review of Systems  Constitutional: Negative.   HENT: Negative.    Eyes: Negative.   Respiratory: Negative.    Cardiovascular: Negative.  Gastrointestinal: Negative.   Endocrine: Negative.   Genitourinary: Negative.   Musculoskeletal: Negative.   Skin: Negative.   Allergic/Immunologic: Negative.   Neurological: Negative.   Hematological: Negative.   Psychiatric/Behavioral: Negative.    All other systems reviewed and are negative.         Objective:   Vitals:  Vitals:   10/12/23 1017  BP: 118/60  Pulse: 74  Resp: 16  SpO2: 96%  Weight: 190 lb (86.2 kg)  Height: 5' 5 (1.651 m)    Body mass index is 31.62 kg/m.  Physical Exam Vitals and nursing note reviewed.  Constitutional:      General: She is not in acute distress.    Appearance: Normal appearance. She is well-developed. She is obese. She is not ill-appearing, toxic-appearing or diaphoretic.  HENT:     Head: Normocephalic and atraumatic.     Right Ear: External ear normal.     Left Ear: External ear normal.     Nose: Nose normal.     Mouth/Throat:     Pharynx: Uvula midline.   Eyes:     General: Lids are normal. No scleral icterus.       Right eye: No discharge.        Left eye: No discharge.     Conjunctiva/sclera: Conjunctivae normal.     Pupils: Pupils are equal, round, and reactive to light.   Neck:     Trachea: Phonation normal. No tracheal deviation.   Cardiovascular:     Rate and Rhythm: Normal rate and regular  rhythm.     Pulses: Normal pulses.          Radial pulses are 2+ on the right side and 2+ on the left side.       Posterior tibial pulses are 2+ on the right side and 2+ on the left side.     Heart sounds: Normal heart sounds. No murmur heard.    No friction rub. No gallop.     Comments: Non-pitting bilateral edema Pulmonary:     Effort: Pulmonary effort is normal. No respiratory distress.     Breath sounds: Normal breath sounds. No stridor. No wheezing, rhonchi or rales.  Chest:     Chest wall: No tenderness.  Abdominal:     General: Bowel sounds are normal. There is no distension.     Palpations: Abdomen is soft.     Tenderness: There is no abdominal tenderness. There is no guarding or rebound.   Musculoskeletal:        General: No deformity.     Cervical back: Normal range of motion and neck supple.     Right lower leg: Edema present.     Left lower leg: Edema present.  Lymphadenopathy:     Cervical: No cervical adenopathy.   Skin:    General: Skin is warm and dry.     Capillary Refill: Capillary refill takes less than 2 seconds.     Coloration: Skin is not pale.     Findings: Bruising present. No rash.   Neurological:     Mental Status: She is alert. Mental status is at baseline.     Motor: No abnormal muscle tone.     Coordination: Coordination normal.     Gait: Gait normal.   Psychiatric:        Mood and Affect: Mood normal.        Speech: Speech normal.        Behavior: Behavior normal.  Fall Risk:    10/12/2023   10:26 AM 10/11/2022    8:45 AM 04/08/2022    2:44 PM 10/01/2021    3:18 PM 08/19/2021    8:53 AM  Fall Risk   Falls in the past year? 0 0 0 0 0  Number falls in past yr: 0  0    Injury with Fall? 0  0    Risk for fall due to : No Fall Risks No Fall Risks No Fall Risks No Fall Risks No Fall Risks  Follow up Falls prevention discussed Falls prevention discussed;Education provided;Falls evaluation completed Falls prevention discussed;Education  provided;Falls evaluation completed  Falls prevention discussed;Education provided;Falls evaluation completed  Falls prevention discussed      Data saved with a previous flowsheet row definition    Functional Status Survey: Is the patient deaf or have difficulty hearing?: No Does the patient have difficulty seeing, even when wearing glasses/contacts?: No Does the patient have difficulty concentrating, remembering, or making decisions?: No Does the patient have difficulty walking or climbing stairs?: No Does the patient have difficulty dressing or bathing?: No Does the patient have difficulty doing errands alone such as visiting a doctor's office or shopping?: No   Assessment & Plan:    CPE completed today  USPSTF grade A and B recommendations reviewed with patient; age-appropriate recommendations, preventive care, screening tests, etc discussed and encouraged; healthy living encouraged; see AVS for patient education given to patient  Discussed importance of 150 minutes of physical activity weekly, AHA exercise recommendations given to pt in AVS/handout  Discussed importance of healthy diet:  eating lean meats and proteins, avoiding trans fats and saturated fats, avoid simple sugars and excessive carbs in diet, eat 6 servings of fruit/vegetables daily and drink plenty of water  and avoid sweet beverages.    Recommended pt to do annual eye exam and routine dental exams/cleanings  Depression, alcohol, fall screening completed as documented above and per flowsheets  Advance Care planning information and packet discussed and offered today, encouraged pt to discuss with family members/spouse/partner/friends and complete Advanced directive packet and bring copy to office   Reviewed Health Maintenance: Health Maintenance  Topic Date Due   Lung Cancer Screening  10/10/2024 (Originally 10/20/2012)   Pneumococcal Vaccine 19-69 Years old (2 of 2 - PCV) 10/11/2024 (Originally 03/15/2008)    MAMMOGRAM  11/09/2023   DTaP/Tdap/Td (3 - Td or Tdap) 11/16/2023   INFLUENZA VACCINE  12/01/2023   Cervical Cancer Screening (HPV/Pap Cotest)  10/02/2026   Colonoscopy  10/10/2029   Hepatitis C Screening  Completed   HIV Screening  Completed   Zoster Vaccines- Shingrix  Completed   HPV VACCINES  Aged Out   Meningococcal B Vaccine  Aged Out   COVID-19 Vaccine  Discontinued    Immunizations: Immunization History  Administered Date(s) Administered   Pneumococcal Polysaccharide-23 03/16/2007   Td 12/25/2002   Tdap 11/15/2013   Zoster Recombinant(Shingrix) 10/04/2019, 01/04/2020   Vaccines:  Shingrix: 50-64 yo and ask insurance if covered when patient above 59 yo Pneumonia: done at pharmacy - requesting the records       ICD-10-CM   1. Well adult exam  Z00.00 Lipid Profile    Comprehensive Metabolic Panel (CMET)    HgB A1c    CBC with Differential/Platelet    MM 3D SCREENING MAMMOGRAM BILATERAL BREAST    2. Benign essential hypertension  I10 amLODipine  (NORVASC ) 5 MG tablet    chlorthalidone  (HYGROTON ) 25 MG tablet    metoprolol  succinate (TOPROL -XL) 50  MG 24 hr tablet    valsartan  (DIOVAN ) 160 MG tablet   bp at goal today meds refilled    3. Coronary artery disease involving native coronary artery of native heart, unspecified whether angina present  I25.10 metoprolol  succinate (TOPROL -XL) 50 MG 24 hr tablet    rosuvastatin  (CRESTOR ) 40 MG tablet    valsartan  (DIOVAN ) 160 MG tablet    Semaglutide-Weight Management 0.25 MG/0.5ML SOAJ    Semaglutide-Weight Management 0.5 MG/0.5ML SOAJ    Semaglutide-Weight Management 0.25 MG/0.5ML SOAJ   per cardiology, no current exertional sx on BB, DAPT (?) HTN well controlled    4. Mixed hyperlipidemia  E78.2 rosuvastatin  (CRESTOR ) 40 MG tablet    Lipid Profile   due for labs, on statin    5. Anxiety  F41.9 venlafaxine  XR (EFFEXOR -XR) 75 MG 24 hr capsule   sx stable and well controlled on venlafaxine , phq reviewed    6. Encounter  for screening mammogram for malignant neoplasm of breast  Z12.31 MM 3D SCREENING MAMMOGRAM BILATERAL BREAST   [facility] in [place]    7. Class 1 obesity with serious comorbidity and body mass index (BMI) of 31.0 to 31.9 in adult, unspecified obesity type  E66.811 Semaglutide-Weight Management 0.25 MG/0.5ML SOAJ   Z68.31 Semaglutide-Weight Management 0.5 MG/0.5ML SOAJ    Semaglutide-Weight Management 0.25 MG/0.5ML SOAJ   interested in GLP-1 no contratindications, hx of CAD/stent, she will restart weight watchers and increase exercise with GLP 8-12 week f/up on meds Discussed caloric needs, roughly 1600 cal for her age and activity level, increase protein, take a multivitamin Recommend she call insurance about if they cover wegovy or not     8. Current smoker  F17.200    daily 1 ppd, > 20 pack year hx, declines low dose CT lung cancer screening and declines smoking cessation counseling or meds     LAD stent 05/2004 on DAPT long term?     Adeline Hone, PA-C 10/12/23 10:33 AM  [facility]

## 2023-10-16 ENCOUNTER — Ambulatory Visit: Payer: Self-pay | Admitting: Family Medicine

## 2023-10-20 ENCOUNTER — Ambulatory Visit: Payer: Self-pay | Admitting: Family Medicine

## 2023-11-29 ENCOUNTER — Ambulatory Visit: Attending: Cardiology | Admitting: Cardiology

## 2023-11-29 ENCOUNTER — Encounter: Payer: Self-pay | Admitting: Cardiology

## 2023-11-29 VITALS — BP 120/80 | HR 70 | Ht 69.0 in | Wt 188.4 lb

## 2023-11-29 DIAGNOSIS — E663 Overweight: Secondary | ICD-10-CM

## 2023-11-29 DIAGNOSIS — Z6827 Body mass index (BMI) 27.0-27.9, adult: Secondary | ICD-10-CM

## 2023-11-29 DIAGNOSIS — I2510 Atherosclerotic heart disease of native coronary artery without angina pectoris: Secondary | ICD-10-CM | POA: Diagnosis not present

## 2023-11-29 DIAGNOSIS — I10 Essential (primary) hypertension: Secondary | ICD-10-CM | POA: Diagnosis not present

## 2023-11-29 DIAGNOSIS — Z720 Tobacco use: Secondary | ICD-10-CM

## 2023-11-29 DIAGNOSIS — E785 Hyperlipidemia, unspecified: Secondary | ICD-10-CM

## 2023-11-29 NOTE — Progress Notes (Signed)
 Cardiology Office Note   Date:  11/29/2023  ID:  ZYIONNA PESCE, DOB 1961-01-09, MRN [id number] PCP: Leavy Mole, PA-C  [facility] Providers Cardiologist:  None     History of Present Illness Maria Franco is a 63 y.o. female with past medical history of coronary artery disease, primary hypertension, hyperlipidemia, tobacco use, obstructive sleep apnea, who is here today for follow-up on her coronary artery disease.  Previous known history of coronary artery disease with prior cardiac catheterization in 2006 that showed occluded mid LAD with left-to-right collaterals.  She was treated successfully with PCI and DES.  She has undergone multiple stress tests and echocardiograms throughout the years.  Continues to work for the [employer].  She was last seen in clinic by 09/09/2022 by Dr.Arida.  At that time Brilinta  was switched to clopidogrel  as she had been doing extremely well from a cardiac perspective without chest pain, shortness of breath or palpitations.  She returns to clinic today stating that she has been doing well.  She denies any chest pain, shortness of breath, peripheral edema.  States that she does continue to smoke.  She is working on a weight loss at this time.  She is extremely excited today since being adopted she has recently found her birth parents found that she has an aunt, a niece, and a nephew.  She states that her primary care provider just recently gave her samples of Ozempic  to start and advised her to follow-up with cardiology to see if they signed off on her taking a GLP-1's for weight loss.  States that she has been compliant with her current medication regimen without any undue side effects.  Denies any hospitalizations or visits to the emergency department.  ROS: 10 point review of system has been reviewed and considered negative except ones been listed in the HPI  Studies Reviewed EKG Interpretation Date/Time:  Wednesday November 29 2023 09:20:40  EDT Ventricular Rate:  70 PR Interval:  182 QRS Duration:  82 QT Interval:  412 QTC Calculation: 444 R Axis:   -18  Text Interpretation: Normal sinus rhythm Normal ECG Left axis deviation When compared with ECG of 21-May-2004 03:24, No significant change since last tracing Confirmed by Gerard Frederick ([id number]) on 11/29/2023 9:23:14 AM    Risk Assessment/Calculations         Physical Exam VS:  BP 120/80 (BP Location: Right Arm, Patient Position: Sitting, Cuff Size: Normal)   Pulse 70   Ht 5' 9 (1.753 m)   Wt 188 lb 6.4 oz (85.5 kg)   SpO2 99%   BMI 27.82 kg/m        Wt Readings from Last 3 Encounters:  11/29/23 188 lb 6.4 oz (85.5 kg)  10/12/23 190 lb (86.2 kg)  04/21/23 192 lb (87.1 kg)    GEN: Well nourished, well developed in no acute distress NECK: No JVD; No carotid bruits CARDIAC: RRR, no murmurs, rubs, gallops RESPIRATORY:  Clear to auscultation without rales, wheezing or rhonchi  ABDOMEN: Soft, non-tender, non-distended EXTREMITIES:  No edema; No deformity   ASSESSMENT AND PLAN Coronary artery disease: Native coronary arteries without angina with prior heart catheterization in 2006 with successful placement of stent to the mid LAD.  She continues to deny anginal or anginal equivalents.  EKG today reveals sinus rhythm with a rate of 70 with left axis deviation with no acute ischemic changes noted.  She has been continued on aspirin 81 mg daily, clopidogrel  75 mg daily (continued on  long-term dual antiplatelet therapy as tolerated given she has first generation DES), and rosuvastatin  40 mg daily.  No further ischemic testing needed at this time.  Mixed hyperlipidemia with last LDL of 72 which is slightly up above goal of 70.  She has been working on dietary changes and weight loss.  She has continued on rosuvastatin  40 mg daily.  She continues to remain above goal consider adding ezetimibe 10 mg daily.  Primary hypertension with blood pressure 120/80.  Blood pressure has  been well-controlled.  She has been continued on amlodipine  5 mg daily, chlorthalidone  25 mg daily, Toprol -XL 50 mg daily, and Diovan  160 mg daily.  She has been encouraged to continue to monitor pressure 1 to 2 hours postmedication administration as well.  Tobacco use with total cessation being recommended.  Overweight with a BMI of 27.82.  She was recently prescribed Ozempic  by her PCP.  She has questions today about insurance covering Ozempic .  Insurance will likely not cover Ozempic  as she is not diabetic but with her history of obstructive sleep apnea and will likely cover Zepbound.  Will send correspondence to her PCP.        Dispo: Patient to return to clinic to see MD/APP in 11 to 12 months or sooner if needed for further evaluation  Signed, Pearse Shiffler, NP

## 2023-11-29 NOTE — Patient Instructions (Signed)
 Medication Instructions:  Your physician recommends that you continue on your current medications as directed. Please refer to the Current Medication list given to you today.   *If you need a refill on your cardiac medications before your next appointment, please call your pharmacy*  Lab Work: No labs ordered today  If you have labs (blood work) drawn today and your tests are completely normal, you will receive your results only by: MyChart Message (if you have MyChart) OR A paper copy in the mail If you have any lab test that is abnormal or we need to change your treatment, we will call you to review the results.  Testing/Procedures: No test ordered today   Follow-Up: At [facility], you and your health needs are our priority.  As part of our continuing mission to provide you with exceptional heart care, our providers are all part of one team.  This team includes your primary Cardiologist (physician) and Advanced Practice Providers or APPs (Physician Assistants and Nurse Practitioners) who all work together to provide you with the care you need, when you need it.  Your next appointment:   12 month(s)  Provider:   Tylene Lunch, NP

## 2023-12-15 ENCOUNTER — Ambulatory Visit (INDEPENDENT_AMBULATORY_CARE_PROVIDER_SITE_OTHER): Admitting: Family Medicine

## 2023-12-15 ENCOUNTER — Encounter: Payer: Self-pay | Admitting: Family Medicine

## 2023-12-15 VITALS — BP 124/72 | HR 74 | Resp 16 | Ht 69.0 in | Wt 186.0 lb

## 2023-12-15 DIAGNOSIS — Z1231 Encounter for screening mammogram for malignant neoplasm of breast: Secondary | ICD-10-CM

## 2023-12-15 DIAGNOSIS — Z6831 Body mass index (BMI) 31.0-31.9, adult: Secondary | ICD-10-CM | POA: Diagnosis not present

## 2023-12-15 DIAGNOSIS — G4733 Obstructive sleep apnea (adult) (pediatric): Secondary | ICD-10-CM

## 2023-12-15 DIAGNOSIS — I10 Essential (primary) hypertension: Secondary | ICD-10-CM | POA: Diagnosis not present

## 2023-12-15 DIAGNOSIS — I2510 Atherosclerotic heart disease of native coronary artery without angina pectoris: Secondary | ICD-10-CM | POA: Diagnosis not present

## 2023-12-15 DIAGNOSIS — E66811 Obesity, class 1: Secondary | ICD-10-CM | POA: Diagnosis not present

## 2023-12-15 DIAGNOSIS — E782 Mixed hyperlipidemia: Secondary | ICD-10-CM | POA: Diagnosis not present

## 2023-12-15 DIAGNOSIS — R7303 Prediabetes: Secondary | ICD-10-CM | POA: Diagnosis not present

## 2023-12-15 MED ORDER — SEMAGLUTIDE-WEIGHT MANAGEMENT 0.5 MG/0.5ML ~~LOC~~ SOAJ
0.5000 mg | SUBCUTANEOUS | 2 refills | Status: DC
Start: 2023-12-15 — End: 2024-01-08

## 2023-12-15 NOTE — Progress Notes (Signed)
 Name: Maria Franco   MRN: [id number]    DOB: [birth year]   Date:12/15/2023       Progress Note  Chief Complaint  Patient presents with   Follow-up    Wegovy - today is her 3rd shot. No side effects from shot.     Subjective:   Maria Franco is a 63 y.o. female, presents to clinic for for f/up on weight/SE after starting wegovy   She's tolerated meds, no abd pain and N/V, she has lost weight is working on diet and exercise She was doing Psychologist, occupational Started with 0.25 mg dose in June increased July 11 to 0.50 mg weekly dose Wt Readings from Last 5 Encounters:  12/15/23 186 lb (84.4 kg)  11/29/23 188 lb 6.4 oz (85.5 kg)  10/12/23 190 lb (86.2 kg)  04/21/23 192 lb (87.1 kg)  10/11/22 188 lb 12.8 oz (85.6 kg)   BMI Readings from Last 5 Encounters:  12/15/23 27.47 kg/m  11/29/23 27.82 kg/m  10/12/23 31.62 kg/m  04/21/23 31.95 kg/m  10/11/22 30.94 kg/m         Current Outpatient Medications:    amLODipine  (NORVASC ) 5 MG tablet, Take 1 tablet (5 mg total) by mouth daily., Disp: 90 tablet, Rfl: 1   aspirin 81 MG tablet, Take 81 mg by mouth daily., Disp: , Rfl:    chlorthalidone  (HYGROTON ) 25 MG tablet, Take 1 tablet (25 mg total) by mouth daily., Disp: 90 tablet, Rfl: 1   Cholecalciferol (VITAMIN D3) 50 MCG (2000 UT) TABS, Take by mouth., Disp: , Rfl:    clopidogrel  (PLAVIX ) 75 MG tablet, Take 1 tablet (75 mg total) by mouth daily., Disp: 90 tablet, Rfl: 1   metoprolol  succinate (TOPROL -XL) 50 MG 24 hr tablet, Take 1 tablet (50 mg total) by mouth daily. Take with or immediately following a meal., Disp: 90 tablet, Rfl: 1   Multiple Vitamins-Minerals (MULTIVITAMIN WOMEN PO), Take by mouth. Vitafusion for women gummies, Disp: , Rfl:    nitroGLYCERIN (NITROSTAT) 0.4 MG SL tablet, Place under the tongue., Disp: , Rfl:    rosuvastatin  (CRESTOR ) 40 MG tablet, Take 1 tablet (40 mg total) by mouth every morning., Disp: 90 tablet, Rfl: 3   valsartan  (DIOVAN ) 160 MG tablet, Take 1  tablet (160 mg total) by mouth daily., Disp: 90 tablet, Rfl: 1   venlafaxine  XR (EFFEXOR -XR) 75 MG 24 hr capsule, Take 1 capsule (75 mg total) by mouth daily with breakfast., Disp: 90 capsule, Rfl: 3   zinc gluconate 50 MG tablet, Take 50 mg by mouth daily., Disp: , Rfl:    semaglutide -weight management (WEGOVY ) 0.5 MG/0.5ML SOAJ SQ injection, Inject 0.5 mg into the skin once a week., Disp: 2 mL, Rfl: 2  Patient Active Problem List   Diagnosis Date Noted   Prediabetes 10/13/2022   Polyp of descending colon    Polyp of ascending colon    Benign essential hypertension 07/10/2019   Proteinuria 07/10/2019   Abnormal Papanicolaou smear of cervix with positive human papilloma virus (HPV) test 03/27/2018   Encounter for screening colonoscopy 03/26/2018   Obesity (BMI 30.0-34.9) 07/30/2017   Anxiety 10/31/2015   Status post coronary artery stent placement 11/21/2014   Hyperlipidemia    Panic attacks    CAD (coronary artery disease)    Tobacco use    Tricuspid valve regurgitation    MVP (mitral valve prolapse)    Left adrenal mass (HCC) 07/16/2013    Past Surgical History:  Procedure Laterality Date   COLONOSCOPY WITH PROPOFOL   N/A 10/11/2019   Procedure: COLONOSCOPY WITH PROPOFOL ;  Surgeon: Jinny Carmine, MD;  Location: [facility];  Service: Endoscopy;  Laterality: N/A;  priority 4   CORONARY ANGIOPLASTY WITH STENT PLACEMENT  2006   x1   ENDOMETRIAL ABLATION  2012   HERNIA REPAIR     at 7 yrs of age   POLYPECTOMY  10/11/2019   Procedure: POLYPECTOMY;  Surgeon: Jinny Carmine, MD;  Location: [facility];  Service: Endoscopy;;   TUBAL LIGATION  2012    Family History  Adopted: Yes  Family history unknown: Yes    Social History   Tobacco Use   Smoking status: Every Day    Current packs/day: 1.00    Average packs/day: 1 pack/day for 30.0 years (30.0 ttl pk-yrs)    Types: Cigarettes   Smokeless tobacco: Never   Tobacco comments:    39 years  Vaping Use   Vaping  status: Former  Substance Use Topics   Alcohol use: Yes    Alcohol/week: 6.0 standard drinks of alcohol    Types: 6 Glasses of wine per week    Comment: Occasionally   Drug use: No     Allergies  Allergen Reactions   Dilaudid [Hydromorphone Hcl] Nausea And Vomiting   Hydromorphone Nausea Only and Nausea And Vomiting    Health Maintenance  Topic Date Due   Pneumococcal Vaccine: 50+ Years (2 of 2 - PCV) 03/15/2008   MAMMOGRAM  11/09/2023   INFLUENZA VACCINE  07/30/2024 (Originally 12/01/2023)   Lung Cancer Screening  10/10/2024 (Originally 10/20/2012)   DTaP/Tdap/Td (3 - Td or Tdap) 12/13/2024 (Originally 11/16/2023)   Cervical Cancer Screening (HPV/Pap Cotest)  10/02/2026   Colonoscopy  10/10/2029   Hepatitis C Screening  Completed   HIV Screening  Completed   Zoster Vaccines- Shingrix  Completed   Hepatitis B Vaccines 19-59 Average Risk  Aged Out   HPV VACCINES  Aged Out   Meningococcal B Vaccine  Aged Out   Pneumococcal Vaccine  Discontinued   COVID-19 Vaccine  Discontinued    Chart Review Today: I personally reviewed active problem list, medication list, allergies, family history, social history, health maintenance, notes from last encounter, lab results, imaging with the patient/caregiver today.   Review of Systems  Constitutional: Negative.   HENT: Negative.    Eyes: Negative.   Respiratory: Negative.    Cardiovascular: Negative.   Gastrointestinal: Negative.   Endocrine: Negative.   Genitourinary: Negative.   Musculoskeletal: Negative.   Skin: Negative.   Allergic/Immunologic: Negative.   Neurological: Negative.   Hematological: Negative.   Psychiatric/Behavioral: Negative.    All other systems reviewed and are negative.    Objective:   Vitals:   12/15/23 1536  BP: 124/72  Pulse: 74  Resp: 16  SpO2: 98%  Weight: 186 lb (84.4 kg)  Height: 5' 9 (1.753 m)    Body mass index is 27.47 kg/m.  Physical Exam Vitals and nursing note reviewed.   Constitutional:      General: She is not in acute distress.    Appearance: Normal appearance. She is well-developed. She is not ill-appearing, toxic-appearing or diaphoretic.  HENT:     Head: Normocephalic and atraumatic.     Right Ear: External ear normal.     Left Ear: External ear normal.     Nose: Nose normal.  Eyes:     General: No scleral icterus.       Right eye: No discharge.        Left  eye: No discharge.     Conjunctiva/sclera: Conjunctivae normal.  Neck:     Trachea: No tracheal deviation.  Cardiovascular:     Rate and Rhythm: Normal rate.  Pulmonary:     Effort: Pulmonary effort is normal. No respiratory distress.     Breath sounds: No stridor.  Skin:    General: Skin is warm and dry.     Findings: No rash.  Neurological:     Mental Status: She is alert.     Motor: No abnormal muscle tone.     Coordination: Coordination normal.     Gait: Gait normal.  Psychiatric:        Mood and Affect: Mood normal.        Behavior: Behavior normal.      Functional Status Survey:   Results for orders placed or performed in visit on 10/12/23  Lipid Profile   Collection Time: 10/12/23 11:21 AM  Result Value Ref Range   Cholesterol 146 <200 mg/dL   HDL 49 (L) > OR = 50 mg/dL   Triglycerides 825 (H) <150 mg/dL   LDL Cholesterol (Calc) 72 mg/dL (calc)   Total CHOL/HDL Ratio 3.0 <5.0 (calc)   Non-HDL Cholesterol (Calc) 97 <869 mg/dL (calc)  Comprehensive Metabolic Panel (CMET)   Collection Time: 10/12/23 11:21 AM  Result Value Ref Range   Glucose, Bld 100 (H) 65 - 99 mg/dL   BUN 17 7 - 25 mg/dL   Creat 8.88 (H) 9.49 - 1.05 mg/dL   eGFR 57 (L) > OR = 60 mL/min/1.61m2   BUN/Creatinine Ratio 15 6 - 22 (calc)   Sodium 141 135 - 146 mmol/L   Potassium 3.8 3.5 - 5.3 mmol/L   Chloride 103 98 - 110 mmol/L   CO2 30 20 - 32 mmol/L   Calcium  9.7 8.6 - 10.4 mg/dL   Total Protein 7.1 6.1 - 8.1 g/dL   Albumin 4.4 3.6 - 5.1 g/dL   Globulin 2.7 1.9 - 3.7 g/dL (calc)   AG Ratio  1.6 1.0 - 2.5 (calc)   Total Bilirubin 0.5 0.2 - 1.2 mg/dL   Alkaline phosphatase (APISO) 81 37 - 153 U/L   AST 20 10 - 35 U/L   ALT 20 6 - 29 U/L  HgB A1c   Collection Time: 10/12/23 11:21 AM  Result Value Ref Range   Hgb A1c MFr Bld 5.6 <5.7 %   Mean Plasma Glucose 114 mg/dL   eAG (mmol/L) 6.3 mmol/L  CBC with Differential/Platelet   Collection Time: 10/12/23 11:21 AM  Result Value Ref Range   WBC 9.3 3.8 - 10.8 Thousand/uL   RBC 4.95 3.80 - 5.10 Million/uL   Hemoglobin 15.9 (H) 11.7 - 15.5 g/dL   HCT 52.2 (H) 64.9 - 54.9 %   MCV 96.4 80.0 - 100.0 fL   MCH 32.1 27.0 - 33.0 pg   MCHC 33.3 32.0 - 36.0 g/dL   RDW 86.8 88.9 - 84.9 %   Platelets 230 140 - 400 Thousand/uL   MPV 10.2 7.5 - 12.5 fL   Neutro Abs 6,185 1,500 - 7,800 cells/uL   Absolute Lymphocytes 2,037 850 - 3,900 cells/uL   Absolute Monocytes 893 200 - 950 cells/uL   Eosinophils Absolute 158 15 - 500 cells/uL   Basophils Absolute 28 0 - 200 cells/uL   Neutrophils Relative % 66.5 %   Total Lymphocyte 21.9 %   Monocytes Relative 9.6 %   Eosinophils Relative 1.7 %   Basophils Relative 0.3 %  Assessment & Plan:   Class 1 obesity with serious comorbidity and body mass index (BMI) of 31.0 to 31.9 in adult, unspecified obesity type Assessment & Plan: Working on diet with [employer], increasing activity, feeling good on current dose 0.5mg  Only on meds for < 8 weeks and thus far down 8 lbs, -4.16% body weight when starting meds Wt Readings from Last 5 Encounters:  12/15/23 186 lb (84.4 kg)  11/29/23 188 lb 6.4 oz (85.5 kg)  10/12/23 190 lb (86.2 kg)  04/21/23 192 lb (87.1 kg)  10/11/22 188 lb 12.8 oz (85.6 kg)   BMI Readings from Last 5 Encounters:  12/15/23 27.47 kg/m  11/29/23 27.82 kg/m  10/12/23 31.62 kg/m  04/21/23 31.95 kg/m  10/11/22 30.94 kg/m   Plan to stay on same dose for now 0.50 mg dose weekly Pt asked to contact me via mychart when needing next refill/dose and preference on when she feels  the need to increase dose.  Orders: -     Semaglutide -Weight Management; Inject 0.5 mg into the skin once a week.  Dispense: 2 mL; Refill: 2  Coronary artery disease involving native coronary artery of native heart, unspecified whether angina present -     Semaglutide -Weight Management; Inject 0.5 mg into the skin once a week.  Dispense: 2 mL; Refill: 2  Prediabetes -     Semaglutide -Weight Management; Inject 0.5 mg into the skin once a week.  Dispense: 2 mL; Refill: 2  Benign essential hypertension -     Semaglutide -Weight Management; Inject 0.5 mg into the skin once a week.  Dispense: 2 mL; Refill: 2  Mixed hyperlipidemia -     Semaglutide -Weight Management; Inject 0.5 mg into the skin once a week.  Dispense: 2 mL; Refill: 2  OSA (obstructive sleep apnea) -     Semaglutide -Weight Management; Inject 0.5 mg into the skin once a week.  Dispense: 2 mL; Refill: 2  Breast cancer screening by mammogram -     3D Screening Mammogram, Left and Right; Future     Return for 2-3 month f/up wegovy /weight loss meds .   Michelene Cower, PA-C 12/15/23 4:10 PM

## 2023-12-19 ENCOUNTER — Other Ambulatory Visit: Payer: Self-pay | Admitting: Physician Assistant

## 2023-12-21 NOTE — Telephone Encounter (Signed)
 Requested Prescriptions  Pending Prescriptions Disp Refills   clopidogrel  (PLAVIX ) 75 MG tablet [Pharmacy Med Name: CLOPIDOGREL  75 MG TABLET] 90 tablet 1    Sig: Take 1 tablet (75 mg total) by mouth daily.     Hematology: Antiplatelets - clopidogrel  Failed - 12/21/2023 11:46 AM      Failed - HCT in normal range and within 180 days    HCT  Date Value Ref Range Status  10/12/2023 47.7 (H) 35.0 - 45.0 % Final         Failed - HGB in normal range and within 180 days    Hemoglobin  Date Value Ref Range Status  10/12/2023 15.9 (H) 11.7 - 15.5 g/dL Final         Failed - Cr in normal range and within 360 days    Creat  Date Value Ref Range Status  10/12/2023 1.11 (H) 0.50 - 1.05 mg/dL Final   Creatinine, Urine  Date Value Ref Range Status  06/08/2018 95 20 - 275 mg/dL Final         Passed - PLT in normal range and within 180 days    Platelets  Date Value Ref Range Status  10/12/2023 230 140 - 400 Thousand/uL Final         Passed - Valid encounter within last 6 months    Recent Outpatient Visits           6 days ago Breast cancer screening by mammogram   [facility] Leavy Mole, PA-C   2 months ago Well adult exam   [facility] Leavy Mole, PA-C

## 2023-12-26 ENCOUNTER — Telehealth: Payer: Self-pay

## 2023-12-26 NOTE — Telephone Encounter (Signed)
 Copied from CRM [id number]. Topic: Clinical - Medical Advice >> Dec 26, 2023  2:58 PM Nathanel BROCKS wrote: Reason for CRM: pt called back and her mammogram is scheduled in [facility] and is requesting it to be done in [facility]. However, they do not do 3d only [place]> pt wants to know does she need the 3d mammo? Please call pt back and advise, [phone] Pt spoke with Jamie earlier.

## 2023-12-28 ENCOUNTER — Encounter: Payer: Self-pay | Admitting: Family Medicine

## 2023-12-28 DIAGNOSIS — G4733 Obstructive sleep apnea (adult) (pediatric): Secondary | ICD-10-CM | POA: Insufficient documentation

## 2023-12-28 NOTE — Assessment & Plan Note (Signed)
 Working on diet with [employer], increasing activity, feeling good on current dose 0.5mg  Only on meds for < 8 weeks and thus far down 8 lbs, -4.16% body weight when starting meds Wt Readings from Last 5 Encounters:  12/15/23 186 lb (84.4 kg)  11/29/23 188 lb 6.4 oz (85.5 kg)  10/12/23 190 lb (86.2 kg)  04/21/23 192 lb (87.1 kg)  10/11/22 188 lb 12.8 oz (85.6 kg)   BMI Readings from Last 5 Encounters:  12/15/23 27.47 kg/m  11/29/23 27.82 kg/m  10/12/23 31.62 kg/m  04/21/23 31.95 kg/m  10/11/22 30.94 kg/m   Plan to stay on same dose for now 0.50 mg dose weekly Pt asked to contact me via mychart when needing next refill/dose and preference on when she feels the need to increase dose.

## 2024-01-02 ENCOUNTER — Telehealth: Payer: Self-pay

## 2024-01-02 DIAGNOSIS — G4733 Obstructive sleep apnea (adult) (pediatric): Secondary | ICD-10-CM

## 2024-01-02 DIAGNOSIS — I2510 Atherosclerotic heart disease of native coronary artery without angina pectoris: Secondary | ICD-10-CM

## 2024-01-02 DIAGNOSIS — I10 Essential (primary) hypertension: Secondary | ICD-10-CM

## 2024-01-02 DIAGNOSIS — Z6831 Body mass index (BMI) 31.0-31.9, adult: Secondary | ICD-10-CM

## 2024-01-02 DIAGNOSIS — R7303 Prediabetes: Secondary | ICD-10-CM

## 2024-01-02 DIAGNOSIS — E782 Mixed hyperlipidemia: Secondary | ICD-10-CM

## 2024-01-02 NOTE — Telephone Encounter (Signed)
 Copied from CRM [id number]. Topic: Clinical - Prescription Issue >> Jan 02, 2024  2:13 PM Turkey B wrote: Reason for CRM: wendy from [facility] called in, states Wegovy  isn;'t covered  under patient insurance, so wants to try Zepbound

## 2024-01-05 ENCOUNTER — Ambulatory Visit: Admitting: Cardiology

## 2024-01-08 MED ORDER — TIRZEPATIDE-WEIGHT MANAGEMENT 2.5 MG/0.5ML ~~LOC~~ SOLN: 2.5000 mg | SUBCUTANEOUS | 0 refills | Status: DC

## 2024-01-08 NOTE — Addendum Note (Signed)
 Addended by: Cristhian Vanhook on: 01/08/2024 11:06 AM   Modules accepted: Orders

## 2024-01-08 NOTE — Telephone Encounter (Signed)
 Called and lvm for patient

## 2024-01-12 DIAGNOSIS — Z1231 Encounter for screening mammogram for malignant neoplasm of breast: Secondary | ICD-10-CM | POA: Diagnosis not present

## 2024-01-29 ENCOUNTER — Other Ambulatory Visit: Payer: Self-pay | Admitting: Family Medicine

## 2024-01-29 ENCOUNTER — Telehealth: Payer: Self-pay

## 2024-01-29 DIAGNOSIS — G4733 Obstructive sleep apnea (adult) (pediatric): Secondary | ICD-10-CM

## 2024-01-29 DIAGNOSIS — Z6831 Body mass index (BMI) 31.0-31.9, adult: Secondary | ICD-10-CM

## 2024-01-29 DIAGNOSIS — R7303 Prediabetes: Secondary | ICD-10-CM

## 2024-01-29 DIAGNOSIS — I2510 Atherosclerotic heart disease of native coronary artery without angina pectoris: Secondary | ICD-10-CM

## 2024-01-29 DIAGNOSIS — I10 Essential (primary) hypertension: Secondary | ICD-10-CM

## 2024-01-29 DIAGNOSIS — E782 Mixed hyperlipidemia: Secondary | ICD-10-CM

## 2024-01-29 MED ORDER — TIRZEPATIDE-WEIGHT MANAGEMENT 2.5 MG/0.5ML ~~LOC~~ SOLN: 2.5000 mg | SUBCUTANEOUS | 0 refills | Status: AC

## 2024-01-29 NOTE — Telephone Encounter (Signed)
 Please start PA for Zepbound

## 2024-01-29 NOTE — Telephone Encounter (Signed)
 Copied from CRM #[id number]. Topic: Clinical - Medication Question >> Jan 29, 2024  9:02 AM Anairis L wrote: Reason for CRM: Wegovy  is being denied, pharmacy claims Auth is required for medication.Please call insurance and update Mrs. Langdon with what they will cover. Shot was due 01/25/2024 pharmacy claims they have reach out Wednesday.   Thank you.

## 2024-01-30 ENCOUNTER — Other Ambulatory Visit (HOSPITAL_COMMUNITY): Payer: Self-pay

## 2024-01-30 ENCOUNTER — Telehealth: Payer: Self-pay | Admitting: Pharmacy Technician

## 2024-01-30 NOTE — Telephone Encounter (Signed)
 PA request has been Received. New Encounter has been or will be created for follow up. For additional info see Pharmacy Prior Auth telephone encounter from 01/30/24.

## 2024-01-30 NOTE — Telephone Encounter (Signed)
Can we try wegovy?

## 2024-01-30 NOTE — Telephone Encounter (Signed)
 Pharmacy Patient Advocate Encounter   Received notification from Pt Calls Messages that prior authorization for Zepbound  2.5 mg/0.5ml pen is required/requested.   Insurance verification completed.   The patient is insured through [employer] .   Per test claim: Per test claim, medication is not covered due to plan/benefit exclusion, PA not submitted at this time

## 2024-01-31 NOTE — Telephone Encounter (Signed)
 Requested medication (s) are due for refill today: yes  Requested medication (s) are on the active medication list: yes  Last refill:  01/29/24  Future visit scheduled: yes  Notes to clinic:  Pharmacy comment: Alternative Requested:THE PRESCRIBED MEDICATION IS NOT COVERED BY INSURANCE. PLEASE CONSIDER CHANGING TO ONE OF THE SUGGESTED COVERED ALTERNATIVES.      Requested Prescriptions  Pending Prescriptions Disp Refills   Liraglutide -Weight Management 18 MG/3ML SOPN [Pharmacy Med Name: LIRAGLUTIDE 5-PAK (SAXENDA)]  0     Endocrinology:  Diabetes - GLP-1 Receptor Agonists Passed - 01/31/2024 11:27 AM      Passed - HBA1C is between 0 and 7.9 and within 180 days    Hgb A1c MFr Bld  Date Value Ref Range Status  10/12/2023 5.6 <5.7 % Final    Comment:    For the purpose of screening for the presence of diabetes: . <5.7%       Consistent with the absence of diabetes 5.7-6.4%    Consistent with increased risk for diabetes             (prediabetes) > or =6.5%  Consistent with diabetes . This assay result is consistent with a decreased risk of diabetes. . Currently, no consensus exists regarding use of hemoglobin A1c for diagnosis of diabetes in children. . According to American Diabetes Association (ADA) guidelines, hemoglobin A1c <7.0% represents optimal control in non-pregnant diabetic patients. Different metrics may apply to specific patient populations.  Standards of Medical Care in Diabetes(ADA). SABRA Amy - Valid encounter within last 6 months    Recent Outpatient Visits           1 month ago Class 1 obesity with serious comorbidity and body mass index (BMI) of 31.0 to 31.9 in adult, unspecified obesity type   [facility] Leavy Mole, PA-C   3 months ago Well adult exam   [facility] Leavy Mole, PA-C

## 2024-02-01 ENCOUNTER — Other Ambulatory Visit: Payer: Self-pay | Admitting: Family Medicine

## 2024-02-01 DIAGNOSIS — Z6831 Body mass index (BMI) 31.0-31.9, adult: Secondary | ICD-10-CM

## 2024-02-01 DIAGNOSIS — G4733 Obstructive sleep apnea (adult) (pediatric): Secondary | ICD-10-CM

## 2024-02-01 DIAGNOSIS — I10 Essential (primary) hypertension: Secondary | ICD-10-CM

## 2024-02-01 DIAGNOSIS — I2510 Atherosclerotic heart disease of native coronary artery without angina pectoris: Secondary | ICD-10-CM

## 2024-02-01 DIAGNOSIS — R7303 Prediabetes: Secondary | ICD-10-CM

## 2024-02-01 DIAGNOSIS — E782 Mixed hyperlipidemia: Secondary | ICD-10-CM

## 2024-02-02 ENCOUNTER — Other Ambulatory Visit: Payer: Self-pay | Admitting: Family Medicine

## 2024-02-02 MED ORDER — WEGOVY 0.5 MG/0.5ML ~~LOC~~ SOAJ: 0.5000 mg | SUBCUTANEOUS | 0 refills | Status: AC

## 2024-02-02 NOTE — Telephone Encounter (Signed)
 Pharmacy comment: Alternative Requested:THE PRESCRIBED MEDICATION IS NOT COVERED BY INSURANCE. PLEASE CONSIDER CHANGING TO ONE OF THE SUGGESTED COVERED ALTERNATIVES.

## 2024-02-02 NOTE — Addendum Note (Signed)
 Addended by: YVONE WARREN BROCKS on: 02/02/2024 02:24 PM   Modules accepted: Orders

## 2024-04-12 ENCOUNTER — Ambulatory Visit: Admitting: Family Medicine
